# Patient Record
Sex: Female | Born: 1955 | Race: Black or African American | Hispanic: No | Marital: Married | Smoking: Never smoker
Health system: Southern US, Community
[De-identification: ages and names within clinical notes are randomized; demographics above are authoritative.]

## PROBLEM LIST (undated history)

## (undated) DIAGNOSIS — I10 Essential (primary) hypertension: Secondary | ICD-10-CM

## (undated) DIAGNOSIS — Z860100 Personal history of colon polyps, unspecified: Secondary | ICD-10-CM

## (undated) DIAGNOSIS — F32A Depression, unspecified: Secondary | ICD-10-CM

## (undated) DIAGNOSIS — G709 Myoneural disorder, unspecified: Secondary | ICD-10-CM

## (undated) DIAGNOSIS — D649 Anemia, unspecified: Secondary | ICD-10-CM

## (undated) DIAGNOSIS — F329 Major depressive disorder, single episode, unspecified: Secondary | ICD-10-CM

## (undated) DIAGNOSIS — M2170 Unequal limb length (acquired), unspecified site: Secondary | ICD-10-CM

## (undated) DIAGNOSIS — M810 Age-related osteoporosis without current pathological fracture: Secondary | ICD-10-CM

## (undated) DIAGNOSIS — E785 Hyperlipidemia, unspecified: Secondary | ICD-10-CM

## (undated) DIAGNOSIS — M545 Low back pain, unspecified: Secondary | ICD-10-CM

## (undated) DIAGNOSIS — I890 Lymphedema, not elsewhere classified: Secondary | ICD-10-CM

## (undated) DIAGNOSIS — E039 Hypothyroidism, unspecified: Secondary | ICD-10-CM

## (undated) DIAGNOSIS — I872 Venous insufficiency (chronic) (peripheral): Secondary | ICD-10-CM

## (undated) DIAGNOSIS — S52301A Unspecified fracture of shaft of right radius, initial encounter for closed fracture: Secondary | ICD-10-CM

## (undated) DIAGNOSIS — E0590 Thyrotoxicosis, unspecified without thyrotoxic crisis or storm: Secondary | ICD-10-CM

## (undated) DIAGNOSIS — E119 Type 2 diabetes mellitus without complications: Secondary | ICD-10-CM

## (undated) DIAGNOSIS — M5430 Sciatica, unspecified side: Secondary | ICD-10-CM

## (undated) DIAGNOSIS — M461 Sacroiliitis, not elsewhere classified: Secondary | ICD-10-CM

## (undated) HISTORY — DX: Anemia, unspecified: D64.9

## (undated) HISTORY — DX: Low back pain, unspecified: M54.50

## (undated) HISTORY — PX: COLONOSCOPY: SHX174

## (undated) HISTORY — DX: Major depressive disorder, single episode, unspecified: F32.9

## (undated) HISTORY — DX: Hyperlipidemia, unspecified: E78.5

## (undated) HISTORY — DX: Unequal limb length (acquired), unspecified site: M21.70

## (undated) HISTORY — DX: Sacroiliitis, not elsewhere classified: M46.1

## (undated) HISTORY — DX: Thyrotoxicosis, unspecified without thyrotoxic crisis or storm: E05.90

## (undated) HISTORY — DX: Venous insufficiency (chronic) (peripheral): I87.2

## (undated) HISTORY — PX: TUBAL LIGATION: SHX77

## (undated) HISTORY — DX: Lymphedema, not elsewhere classified: I89.0

## (undated) HISTORY — DX: Sciatica, unspecified side: M54.30

## (undated) HISTORY — DX: Hypothyroidism, unspecified: E03.9

## (undated) HISTORY — DX: Depression, unspecified: F32.A

## (undated) HISTORY — DX: Personal history of colon polyps, unspecified: Z86.0100

## (undated) HISTORY — PX: EYE SURGERY: SHX253

## (undated) HISTORY — DX: Type 2 diabetes mellitus without complications: E11.9

## (undated) HISTORY — DX: Personal history of colonic polyps: Z86.010

## (undated) HISTORY — DX: Low back pain: M54.5

## (undated) HISTORY — DX: Essential (primary) hypertension: I10

---

## 2000-04-23 DIAGNOSIS — I890 Lymphedema, not elsewhere classified: Secondary | ICD-10-CM | POA: Insufficient documentation

## 2004-10-27 ENCOUNTER — Encounter: Payer: Self-pay | Admitting: Internal Medicine

## 2005-09-21 HISTORY — PX: REPLACEMENT TOTAL KNEE: SUR1224

## 2007-02-22 HISTORY — PX: REVISION OF SCAR TISSUE RECTUS MUSCLE: SHX2351

## 2007-09-19 ENCOUNTER — Ambulatory Visit: Payer: Self-pay | Admitting: Internal Medicine

## 2007-09-19 DIAGNOSIS — D509 Iron deficiency anemia, unspecified: Secondary | ICD-10-CM | POA: Insufficient documentation

## 2007-09-19 DIAGNOSIS — I10 Essential (primary) hypertension: Secondary | ICD-10-CM | POA: Insufficient documentation

## 2007-09-19 DIAGNOSIS — Z860100 Personal history of colon polyps, unspecified: Secondary | ICD-10-CM | POA: Insufficient documentation

## 2007-09-19 DIAGNOSIS — R634 Abnormal weight loss: Secondary | ICD-10-CM

## 2007-09-19 DIAGNOSIS — M545 Low back pain: Secondary | ICD-10-CM

## 2007-09-19 DIAGNOSIS — E785 Hyperlipidemia, unspecified: Secondary | ICD-10-CM | POA: Insufficient documentation

## 2007-09-22 LAB — CONVERTED CEMR LAB
ALT: 18 units/L (ref 0–35)
AST: 22 units/L (ref 0–37)
Albumin: 3.9 g/dL (ref 3.5–5.2)
Alkaline Phosphatase: 123 units/L — ABNORMAL HIGH (ref 39–117)
BUN: 9 mg/dL (ref 6–23)
Bilirubin, Direct: 0.1 mg/dL (ref 0.0–0.3)
Chloride: 101 meq/L (ref 96–112)
Cholesterol: 172 mg/dL (ref 0–200)
Creatinine, Ser: 0.9 mg/dL (ref 0.4–1.2)
Eosinophils Relative: 2 % (ref 0.0–5.0)
GFR calc non Af Amer: 70 mL/min
LDL Cholesterol: 86 mg/dL (ref 0–99)
Lymphocytes Relative: 45 % (ref 12.0–46.0)
MCHC: 34.2 g/dL (ref 30.0–36.0)
Monocytes Absolute: 0.5 10*3/uL (ref 0.1–1.0)
Monocytes Relative: 6.8 % (ref 3.0–12.0)
Nitrite: NEGATIVE
RDW: 14.2 % (ref 11.5–14.6)
Specific Gravity, Urine: 1.02 (ref 1.000–1.03)
TSH: 0.15 microintl units/mL — ABNORMAL LOW (ref 0.35–5.50)
Total Bilirubin: 0.7 mg/dL (ref 0.3–1.2)
Total CHOL/HDL Ratio: 2.3
Urine Glucose: NEGATIVE mg/dL
Urobilinogen, UA: 1 (ref 0.0–1.0)
VLDL: 10 mg/dL (ref 0–40)
WBC: 6.8 10*3/uL (ref 4.5–10.5)

## 2007-09-23 ENCOUNTER — Ambulatory Visit: Payer: Self-pay | Admitting: Internal Medicine

## 2007-09-23 ENCOUNTER — Encounter: Payer: Self-pay | Admitting: Internal Medicine

## 2007-09-24 LAB — CONVERTED CEMR LAB: Vit D, 1,25-Dihydroxy: 25 — ABNORMAL LOW (ref 30–89)

## 2007-10-08 ENCOUNTER — Ambulatory Visit: Payer: Self-pay | Admitting: Endocrinology

## 2007-10-08 DIAGNOSIS — E042 Nontoxic multinodular goiter: Secondary | ICD-10-CM | POA: Insufficient documentation

## 2007-10-08 DIAGNOSIS — E0590 Thyrotoxicosis, unspecified without thyrotoxic crisis or storm: Secondary | ICD-10-CM | POA: Insufficient documentation

## 2007-10-08 DIAGNOSIS — N951 Menopausal and female climacteric states: Secondary | ICD-10-CM | POA: Insufficient documentation

## 2007-10-13 ENCOUNTER — Encounter: Admission: RE | Admit: 2007-10-13 | Discharge: 2007-10-13 | Payer: Self-pay | Admitting: Endocrinology

## 2007-10-21 ENCOUNTER — Encounter: Admission: RE | Admit: 2007-10-21 | Discharge: 2007-10-21 | Payer: Self-pay | Admitting: Endocrinology

## 2007-11-06 ENCOUNTER — Encounter: Admission: RE | Admit: 2007-11-06 | Discharge: 2007-11-06 | Payer: Self-pay | Admitting: Endocrinology

## 2008-01-27 ENCOUNTER — Ambulatory Visit: Payer: Self-pay | Admitting: Internal Medicine

## 2008-01-27 DIAGNOSIS — M549 Dorsalgia, unspecified: Secondary | ICD-10-CM | POA: Insufficient documentation

## 2008-01-27 DIAGNOSIS — M25569 Pain in unspecified knee: Secondary | ICD-10-CM

## 2008-01-29 ENCOUNTER — Encounter: Payer: Self-pay | Admitting: Internal Medicine

## 2008-01-29 ENCOUNTER — Encounter: Admission: RE | Admit: 2008-01-29 | Discharge: 2008-01-29 | Payer: Self-pay | Admitting: Internal Medicine

## 2008-02-03 ENCOUNTER — Encounter: Payer: Self-pay | Admitting: Internal Medicine

## 2008-02-12 ENCOUNTER — Ambulatory Visit: Payer: Self-pay | Admitting: Endocrinology

## 2008-02-12 LAB — CONVERTED CEMR LAB
Free T4: 0.8 ng/dL (ref 0.6–1.6)
TSH: 0.81 microintl units/mL (ref 0.35–5.50)

## 2008-04-12 ENCOUNTER — Telehealth (INDEPENDENT_AMBULATORY_CARE_PROVIDER_SITE_OTHER): Payer: Self-pay | Admitting: *Deleted

## 2008-04-28 ENCOUNTER — Ambulatory Visit: Payer: Self-pay | Admitting: Internal Medicine

## 2008-06-04 DIAGNOSIS — K5730 Diverticulosis of large intestine without perforation or abscess without bleeding: Secondary | ICD-10-CM | POA: Insufficient documentation

## 2008-06-09 ENCOUNTER — Ambulatory Visit: Payer: Self-pay | Admitting: Gastroenterology

## 2008-09-23 ENCOUNTER — Telehealth (INDEPENDENT_AMBULATORY_CARE_PROVIDER_SITE_OTHER): Payer: Self-pay | Admitting: *Deleted

## 2008-10-27 ENCOUNTER — Ambulatory Visit: Payer: Self-pay | Admitting: Internal Medicine

## 2008-10-27 DIAGNOSIS — G4710 Hypersomnia, unspecified: Secondary | ICD-10-CM

## 2008-10-29 LAB — CONVERTED CEMR LAB
BUN: 13 mg/dL (ref 6–23)
Basophils Absolute: 0 10*3/uL (ref 0.0–0.1)
Basophils Relative: 0.6 % (ref 0.0–3.0)
Bilirubin Urine: NEGATIVE
Creatinine, Ser: 0.8 mg/dL (ref 0.4–1.2)
Eosinophils Absolute: 0.1 10*3/uL (ref 0.0–0.7)
GFR calc non Af Amer: 95.77 mL/min (ref >60)
HCT: 39.2 % (ref 36.0–46.0)
HDL: 93.1 mg/dL (ref >39.00)
Hemoglobin, Urine: NEGATIVE
Iron: 97 ug/dL (ref 42–145)
MCHC: 34.8 g/dL (ref 30.0–36.0)
MCV: 86.1 fL (ref 78.0–100.0)
Monocytes Absolute: 0.4 10*3/uL (ref 0.1–1.0)
Neutrophils Relative %: 52 % (ref 43.0–77.0)
Nitrite: NEGATIVE
Platelets: 240 10*3/uL (ref 150.0–400.0)
Potassium: 3.5 meq/L (ref 3.5–5.1)
RBC: 4.56 M/uL (ref 3.87–5.11)
Saturation Ratios: 35.7 % (ref 20.0–50.0)
Sodium: 141 meq/L (ref 135–145)
Specific Gravity, Urine: 1.025 (ref 1.000–1.030)
Transferrin: 194.1 mg/dL — ABNORMAL LOW (ref 212.0–360.0)
Urine Glucose: NEGATIVE mg/dL
Vit D, 25-Hydroxy: 29 ng/mL — ABNORMAL LOW (ref 30–89)
WBC: 7 10*3/uL (ref 4.5–10.5)

## 2008-11-17 ENCOUNTER — Ambulatory Visit: Payer: Self-pay | Admitting: Pulmonary Disease

## 2008-11-17 DIAGNOSIS — G4733 Obstructive sleep apnea (adult) (pediatric): Secondary | ICD-10-CM

## 2008-12-15 ENCOUNTER — Encounter: Payer: Self-pay | Admitting: Pulmonary Disease

## 2008-12-15 ENCOUNTER — Ambulatory Visit (HOSPITAL_BASED_OUTPATIENT_CLINIC_OR_DEPARTMENT_OTHER): Admission: RE | Admit: 2008-12-15 | Discharge: 2008-12-15 | Payer: Self-pay | Admitting: Pulmonary Disease

## 2008-12-21 ENCOUNTER — Ambulatory Visit: Payer: Self-pay | Admitting: Pulmonary Disease

## 2008-12-22 ENCOUNTER — Telehealth (INDEPENDENT_AMBULATORY_CARE_PROVIDER_SITE_OTHER): Payer: Self-pay | Admitting: *Deleted

## 2008-12-30 ENCOUNTER — Ambulatory Visit: Payer: Self-pay | Admitting: Pulmonary Disease

## 2009-01-07 ENCOUNTER — Ambulatory Visit: Payer: Self-pay | Admitting: Internal Medicine

## 2009-01-07 DIAGNOSIS — F329 Major depressive disorder, single episode, unspecified: Secondary | ICD-10-CM | POA: Insufficient documentation

## 2009-01-07 DIAGNOSIS — R5381 Other malaise: Secondary | ICD-10-CM | POA: Insufficient documentation

## 2009-01-07 DIAGNOSIS — R5383 Other fatigue: Secondary | ICD-10-CM

## 2009-01-07 DIAGNOSIS — F32A Depression, unspecified: Secondary | ICD-10-CM | POA: Insufficient documentation

## 2009-01-07 LAB — CONVERTED CEMR LAB: TSH: 1.8 microintl units/mL (ref 0.35–5.50)

## 2009-01-31 ENCOUNTER — Encounter: Payer: Self-pay | Admitting: Internal Medicine

## 2009-02-18 ENCOUNTER — Ambulatory Visit: Payer: Self-pay | Admitting: Internal Medicine

## 2009-03-18 ENCOUNTER — Ambulatory Visit: Payer: Self-pay | Admitting: Family Medicine

## 2009-03-18 DIAGNOSIS — J019 Acute sinusitis, unspecified: Secondary | ICD-10-CM

## 2009-03-25 ENCOUNTER — Telehealth: Payer: Self-pay | Admitting: Internal Medicine

## 2009-05-30 ENCOUNTER — Telehealth: Payer: Self-pay | Admitting: Internal Medicine

## 2009-06-17 ENCOUNTER — Ambulatory Visit: Payer: Self-pay | Admitting: Internal Medicine

## 2009-06-17 DIAGNOSIS — H9193 Unspecified hearing loss, bilateral: Secondary | ICD-10-CM

## 2009-06-17 LAB — CONVERTED CEMR LAB: TSH: 1.7 microintl units/mL (ref 0.35–5.50)

## 2009-06-22 ENCOUNTER — Encounter: Payer: Self-pay | Admitting: Internal Medicine

## 2009-06-29 ENCOUNTER — Telehealth: Payer: Self-pay | Admitting: Internal Medicine

## 2009-09-28 ENCOUNTER — Ambulatory Visit: Payer: Self-pay | Admitting: Endocrinology

## 2009-09-28 DIAGNOSIS — R7989 Other specified abnormal findings of blood chemistry: Secondary | ICD-10-CM | POA: Insufficient documentation

## 2009-09-29 LAB — CONVERTED CEMR LAB: TSH: 3.89 microintl units/mL (ref 0.35–5.50)

## 2009-09-30 ENCOUNTER — Encounter: Admission: RE | Admit: 2009-09-30 | Discharge: 2009-09-30 | Payer: Self-pay | Admitting: Endocrinology

## 2009-11-01 ENCOUNTER — Encounter: Payer: Self-pay | Admitting: Internal Medicine

## 2009-11-01 DIAGNOSIS — M899 Disorder of bone, unspecified: Secondary | ICD-10-CM

## 2009-11-01 DIAGNOSIS — M949 Disorder of cartilage, unspecified: Secondary | ICD-10-CM

## 2009-11-09 ENCOUNTER — Ambulatory Visit: Payer: Self-pay | Admitting: Internal Medicine

## 2009-11-09 ENCOUNTER — Encounter: Payer: Self-pay | Admitting: Internal Medicine

## 2010-01-02 ENCOUNTER — Ambulatory Visit: Payer: Self-pay | Admitting: Internal Medicine

## 2010-01-02 LAB — CONVERTED CEMR LAB
AST: 18 units/L (ref 0–37)
Basophils Absolute: 0 10*3/uL (ref 0.0–0.1)
Basophils Relative: 0.2 % (ref 0.0–3.0)
Bilirubin Urine: NEGATIVE
CO2: 34 meq/L — ABNORMAL HIGH (ref 19–32)
Calcium: 9.8 mg/dL (ref 8.4–10.5)
Eosinophils Absolute: 0.2 10*3/uL (ref 0.0–0.7)
Eosinophils Relative: 3.6 % (ref 0.0–5.0)
Hemoglobin, Urine: NEGATIVE
Hemoglobin: 13.1 g/dL (ref 12.0–15.0)
LDL Cholesterol: 86 mg/dL (ref 0–99)
Lymphocytes Relative: 47.1 % — ABNORMAL HIGH (ref 12.0–46.0)
Lymphs Abs: 2.7 10*3/uL (ref 0.7–4.0)
MCHC: 34.4 g/dL (ref 30.0–36.0)
MCV: 85.8 fL (ref 78.0–100.0)
Monocytes Relative: 9.4 % (ref 3.0–12.0)
Neutrophils Relative %: 39.7 % — ABNORMAL LOW (ref 43.0–77.0)
Platelets: 229 10*3/uL (ref 150.0–400.0)
RBC: 4.43 M/uL (ref 3.87–5.11)
RDW: 15.6 % — ABNORMAL HIGH (ref 11.5–14.6)
Total CHOL/HDL Ratio: 2
Total Protein, Urine: NEGATIVE mg/dL
Urine Glucose: NEGATIVE mg/dL
VLDL: 4.4 mg/dL (ref 0.0–40.0)
WBC: 5.7 10*3/uL (ref 4.5–10.5)
pH: 6 (ref 5.0–8.0)

## 2010-01-04 ENCOUNTER — Ambulatory Visit: Payer: Self-pay | Admitting: Internal Medicine

## 2010-01-04 ENCOUNTER — Encounter: Payer: Self-pay | Admitting: Internal Medicine

## 2010-01-05 ENCOUNTER — Encounter: Payer: Self-pay | Admitting: Internal Medicine

## 2010-01-17 ENCOUNTER — Ambulatory Visit (HOSPITAL_COMMUNITY)
Admission: RE | Admit: 2010-01-17 | Discharge: 2010-01-17 | Payer: Self-pay | Source: Home / Self Care | Admitting: Sports Medicine

## 2010-01-31 ENCOUNTER — Encounter: Payer: Self-pay | Admitting: Internal Medicine

## 2010-02-08 ENCOUNTER — Encounter: Payer: Self-pay | Admitting: Internal Medicine

## 2010-03-13 ENCOUNTER — Encounter: Payer: Self-pay | Admitting: Internal Medicine

## 2010-05-23 NOTE — Assessment & Plan Note (Signed)
Summary: FU ON THYROID/NWS   Vital Signs:  Patient profile:   55 year old female Height:      68 inches Weight:      160 pounds BMI:     24.42 O2 Sat:      96 % on Room air Temp:     97.5 degrees F oral Pulse rate:   82 / minute BP sitting:   110 / 72  (left arm) Cuff size:   regular  Vitals Entered ByZella Ball Ewing (June 17, 2009 3:38 PM)  O2 Flow:  Room air CC: Thyroid followup/RE   Primary Care Provider:  Oliver Barre, MD  CC:  Thyroid followup/RE.  History of Present Illness: here after hx of I 131 tx wondering if she needs synthroid tx;  denies s/s low thyroid at this time, including fatigue, wt gain, low HR, skin change, depression, sleeping more.  Needs all meds sent to [facility] today, wondering who to see for optho exam that she is due for but denies eye symptoms such as pain, blurred vision, headache.  Pt denies CP, sob, doe, wheezing, orthopnea, pnd, worsening LE edema, palps, dizziness or syncope   Pt denies new neuro symptoms such as headache, facial or extremity weakness  Also c/o bialt hearing loss bilat, acute on chronic assoc with popping in the ears and fullness and sinus congestion.    Problems Prior to Update: 1)  Sinusitis, Acute  (ICD-461.9) 2)  Need Prophylactic Vaccination&inoculation Flu  (ICD-V04.81) 3)  Depression  (ICD-311) 4)  Fatigue  (ICD-780.79) 5)  Obstructive Sleep Apnea  (ICD-327.23) 6)  Hypersomnia  (ICD-780.54) 7)  Fm Hx Malignant Neoplasm Gastrointestinal Tract  (ICD-V16.0) 8)  Diverticulosis, Colon  (ICD-562.10) 9)  Knee Pain, Right  (ICD-719.46) 10)  Back Pain  (ICD-724.5) 11)  Menopausal Syndrome  (ICD-627.2) 12)  Hyperthyroidism  (ICD-242.90) 13)  Goiter, Multinodular  (ICD-241.1) 14)  Weight Loss  (ICD-783.21) 15)  Low Back Pain  (ICD-724.2) 16)  Preventive Health Care  (ICD-V70.0) 17)  Hypertension  (ICD-401.9) 18)  Hyperlipidemia  (ICD-272.4) 19)  Colonic Polyps, Hx of  (ICD-V12.72) 20)  Anemia-iron Deficiency   (ICD-280.9)  Medications Prior to Update: 1)  Cartia Xt 240 Mg  Cp24 (Diltiazem Hcl Coated Beads) .Marland Kitchen.. 1po Once Daily 2)  Guanfacine Hcl 1 Mg  Tabs (Guanfacine Hcl) .Marland Kitchen.. 1 By Mouth Once Daily 3)  Furosemide 80 Mg  Tabs (Furosemide) .Marland Kitchen.. 1 By Mouth Once Daily 4)  Klor-Con 20 Meq  Pack (Potassium Chloride) .Marland Kitchen.. 1 By Mouth Qd 5)  Lipitor 10 Mg Tabs (Atorvastatin Calcium) .Marland Kitchen.. 1po Once Daily 6)  Ultram Er 300 Mg Xr24h-Tab (Tramadol Hcl) .Marland Kitchen.. 1 By Mouth Once Daily - To Fill May 30, 2009 7)  Adult Aspirin Ec Low Strength 81 Mg  Tbec (Aspirin) .Marland Kitchen.. 1po Qd 8)  Vitamin D3 1000 Unit  Tabs (Cholecalciferol) .... 2 Po Once Daily 9)  Calcium Citrate + D 300-100 Mg-Unit Tabs (Calcium Citrate-Vitamin D) .Marland Kitchen.. 1 Tablet By Mouth Once Daily 10)  Omega-3 350 Mg Caps (Omega-3 Fatty Acids) .Marland Kitchen.. 1 Tablet By Mouth Once Daily 11)  Effexor Xr 75 Mg Xr24h-Cap (Venlafaxine Hcl) .Marland Kitchen.. 1 Daily 12)  Vagifem 25 Mcg Tabs (Estradiol) .... 2 To 3 Times Per Week  Current Medications (verified): 1)  Cartia Xt 240 Mg  Cp24 (Diltiazem Hcl Coated Beads) .Marland Kitchen.. 1po Once Daily 2)  Guanfacine Hcl 1 Mg  Tabs (Guanfacine Hcl) .Marland Kitchen.. 1 By Mouth Once Daily 3)  Furosemide 80 Mg  Tabs (Furosemide) .Marland KitchenMarland KitchenMarland Kitchen  1 By Mouth Once Daily 4)  Klor-Con 20 Meq  Pack (Potassium Chloride) .Marland Kitchen.. 1 By Mouth Once Daily 5)  Lipitor 10 Mg Tabs (Atorvastatin Calcium) .Marland Kitchen.. 1po Once Daily 6)  Ultram Er 300 Mg Xr24h-Tab (Tramadol Hcl) .Marland Kitchen.. 1 By Mouth Once Daily - To Fill Jun 16, 2009 7)  Adult Aspirin Ec Low Strength 81 Mg  Tbec (Aspirin) .Marland Kitchen.. 1po Qd 8)  Vitamin D3 1000 Unit  Tabs (Cholecalciferol) .... 2 Po Once Daily 9)  Calcium Citrate + D 300-100 Mg-Unit Tabs (Calcium Citrate-Vitamin D) .Marland Kitchen.. 1 Tablet By Mouth Once Daily 10)  Omega-3 350 Mg Caps (Omega-3 Fatty Acids) .Marland Kitchen.. 1 Tablet By Mouth Once Daily 11)  Effexor Xr 75 Mg Xr24h-Cap (Venlafaxine Hcl) .Marland Kitchen.. 1 Daily 12)  Vagifem 25 Mcg Tabs (Estradiol) .... 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   Head:  normocephalic and atraumatic.   Eyes:  vision grossly intact, pupils equal, and pupils round.   Ears:  R ear normal and L ear normal.   Nose:  no external deformity and no nasal discharge.   Mouth:  no gingival abnormalities and pharynx pink and moist.   Neck:  supple and no masses.   Lungs:  normal respiratory effort and normal breath sounds.   Heart:  normal rate and regular rhythm.   Msk:  no joint tenderness and no joint swelling.   Extremities:  no edema, no erythema  Psych:  not anxious appearing and not depressed appearing.     Impression & Recommendations:  Problem # 1:  HYPERTHYROIDISM (ICD-242.90) s/p I 131 without significant s/s hypothyroidism; to check TSH   Orders: TLB-TSH (Thyroid Stimulating Hormone) (84443-TSH)  Problem # 2:  HYPERTENSION (ICD-401.9)  Her updated medication list for this problem includes:    Cartia Xt 240 Mg Cp24 (Diltiazem hcl coated beads) .Marland Kitchen... 1po once daily    Guanfacine Hcl 1 Mg Tabs  (Guanfacine hcl) .Marland Kitchen... 1 by mouth once daily    Furosemide 80 Mg Tabs (Furosemide) .Marland Kitchen... 1 by mouth once daily  BP today: 110/72 Prior BP: 164/112 (03/18/2009)  Labs Reviewed: K+: 3.5 (10/27/2008) Creat: : 0.8 (10/27/2008)   Chol: 239 (10/27/2008)   HDL: 93.10 (10/27/2008)   LDL: 86 (09/19/2007)   TG: 44.0 (10/27/2008) stable overall by hx and exam, ok to continue meds/tx as is   Problem # 3:  UNSPECIFIED HEARING LOSS (ICD-389.9) c/w eustachian valve dysfxn  with acute on chronic hearing loss;  try claritin and mucinex OTC for one wk;  consider ENT if not improved  Complete Medication List: 1)  Cartia Xt 240 Mg Cp24 (Diltiazem hcl coated beads) .Marland Kitchen.. 1po once daily 2)  Guanfacine Hcl 1 Mg Tabs (Guanfacine hcl) .Marland Kitchen.. 1 by mouth once daily 3)  Furosemide 80 Mg Tabs (Furosemide) .Marland Kitchen.. 1 by mouth once daily 4)  Klor-con 20 Meq Pack (Potassium chloride) .Marland Kitchen.. 1 by mouth once daily 5)  Lipitor 10 Mg Tabs (Atorvastatin calcium) .Marland Kitchen.. 1po once daily 6)  Ultram Er 300 Mg Xr24h-tab (Tramadol hcl) .Marland Kitchen.. 1 by mouth once daily - to fill Jun 16, 2009 7)  Adult Aspirin Ec Low Strength 81 Mg Tbec (Aspirin) .Marland Kitchen.. 1po qd 8)  Vitamin D3 1000 Unit Tabs (Cholecalciferol) .... 2 po once daily 9)  Calcium Citrate + D 300-100 Mg-unit Tabs (Calcium citrate-vitamin d) .Marland Kitchen.. 1 tablet by mouth once daily 10)  Omega-3 350 Mg Caps (Omega-3 fatty acids) .Marland Kitchen.. 1 tablet by mouth once daily 11)  Effexor Xr 75 Mg Xr24h-cap (Venlafaxine hcl) .Marland Kitchen.. 1 daily 12)  Vagifem 25 Mcg Tabs (Estradiol) .... 2 to 3 times per week  Patient Instructions: 1)  Please call [facility], or [facility] for eye care 2)  Please go to the Lab in the basement for your blood and/or urine tests today 3)  all of your medications were sent to [facility] in [place] except for the Ultram 4)  Please try OTC claritin and Mucinex for one wk; call if no better for ENT referral for the hearing loss 5)  Please schedule a follow-up appointment in 6  months with CPX labs Prescriptions: ULTRAM ER 300 MG XR24H-TAB (TRAMADOL HCL) 1 by mouth once daily - to fill Jun 16, 2009  #90 x 1   Entered and Authorized by:   Corwin Levins MD   Signed by:   Corwin Levins MD on 06/17/2009   Method used:   Print then Give to Patient   RxID:   [id number] LIPITOR 10 MG TABS (ATORVASTATIN CALCIUM) 1po once daily  #90 x 3   Entered and Authorized by:   Corwin Levins MD   Signed by:   Corwin Levins MD on 06/17/2009   Method used:   Electronically to        [facility] ([facility]       [address]       [place], [place]  [address]       Ph: [phone]       Fax: [phone]   RxID:   [id number] KLOR-CON 20 MEQ  PACK (POTASSIUM CHLORIDE) 1 by mouth once daily  #90 x 3   Entered and Authorized by:   Corwin Levins MD   Signed by:   Corwin Levins MD on 06/17/2009   Method used:   Electronically to        [facility] ([facility])       [address]       [place], [place]  [address]       Ph: [phone]       Fax: [phone]   RxID:   [id number] FUROSEMIDE 80 MG  TABS (FUROSEMIDE) 1 by mouth once daily  #90 x 3   Entered and Authorized by:   Corwin Levins MD   Signed by:   Corwin Levins MD on 06/17/2009   Method used:   Electronically to        [facility] ([facility])       [address]  [address]       Ph: [phone]       Fax: [phone]   RxID:   [id number] CARTIA XT 240 MG  CP24 (DILTIAZEM HCL COATED BEADS) 1po once daily  #90 x 3   Entered and Authorized by:   Corwin Levins MD   Signed by:   Corwin Levins MD on 06/17/2009   Method used:   Electronically to        [facility] ([facility])       [address]       [place], [place]  [address]       Ph: [phone]       Fax: [phone]   RxID:   [id number] GUANFACINE HCL 1 MG  TABS (GUANFACINE HCL) 1 by mouth once daily  #90 x 3   Entered and Authorized by:   Corwin Levins MD    Signed by:   Corwin Levins MD on 06/17/2009   Method used:   Electronically to        [facility] ([facility]       [address]       [place], [place]  [address]       Ph: [phone]       Fax: [phone]   RxID:   [id number]

## 2010-05-23 NOTE — Letter (Signed)
Summary: [facility]  [facility]   Imported By: Lennie Odor 02/14/2010 14:31:35  _____________________________________________________________________  External Attachment:    Type:   Image     Comment:   External Document

## 2010-05-23 NOTE — Miscellaneous (Signed)
Summary: Orders Update  Clinical Lists Changes  Problems: Added new problem of OSTEOPENIA (ICD-733.90) Orders: Added new Test order of T-Bone Densitometry [phone]) - Signed

## 2010-05-23 NOTE — Letter (Signed)
Summary: [facility]   Imported By: Lester [place] 03/20/2010 07:03:17  _____________________________________________________________________  External Attachment:    Type:   Image     Comment:   External Document

## 2010-05-23 NOTE — Miscellaneous (Signed)
Summary: BONE DENSITY  Clinical Lists Changes  Orders: Added new Test order of T-Lumbar Vertebral Assessment (770[address]) - Signed 

## 2010-05-23 NOTE — Medication Information (Signed)
Summary: [facility]  [facility]   Imported By: Lester Early 06/24/2009 07:53:34  _____________________________________________________________________  External Attachment:    Type:   Image     Comment:   External Document

## 2010-05-23 NOTE — Letter (Signed)
Summary: [facility]  [facility]   Imported By: Sherian Rein 01/11/2010 07:53:01  _____________________________________________________________________  External Attachment:    Type:   Image     Comment:   External Document

## 2010-05-23 NOTE — Progress Notes (Signed)
Summary: pharmacy?  Phone Note From Pharmacy   [facility] [phone] ref # [id number] Summary of Call: pharmacy called requesting clarification on Rx for Klor-Con pack 1tab by mouth once daily. Should powder or tab be dispensed? Initial call taken by: Margaret Pyle, CMA,  June 29, 2009 4:29 PM  Follow-up for Phone Call        tab is good Follow-up by: Corwin Levins MD,  June 29, 2009 4:39 PM  Additional Follow-up for Phone Call Additional follow up Details #1::        Reuel Boom at [facility] was informed Additional Follow-up by: Sherese Christopher June 30, 2009 8:35 AM

## 2010-05-23 NOTE — Progress Notes (Signed)
Summary: med refill  Phone Note Refill Request  on May 30, 2009 12:01 PM  Refills Requested: Medication #1:  ULTRAM ER 300 MG XR24H-TAB 1 by mouth once daily   Dosage confirmed as above?Dosage Confirmed   Last Refilled: 12/2008 Initial call taken by: Scharlene Gloss,  May 30, 2009 12:01 PM    New/Updated Medications: ULTRAM ER 300 MG XR24H-TAB (TRAMADOL HCL) 1 by mouth once daily - to fill May 30, 2009 Prescriptions: ULTRAM ER 300 MG XR24H-TAB (TRAMADOL HCL) 1 by mouth once daily - to fill May 30, 2009  #30 x 5   Entered and Authorized by:   Corwin Levins MD   Signed by:   Corwin Levins MD on 05/30/2009   Method used:   Print then Give to Patient   RxID:   [id number]  done hardcopy to LIM side B - dahlia  Corwin Levins MD  May 30, 2009 1:01 PM   faxed. Lucious Groves  May 30, 2009 1:32 PM

## 2010-05-23 NOTE — Assessment & Plan Note (Signed)
Summary: CPX /NWS  #   Vital Signs:  Patient profile:   55 year old female Height:      68.5 inches Weight:      161.38 pounds BMI:     24.27 O2 Sat:      95 % on Room air Temp:     98.2 degrees F oral Pulse rate:   89 / minute BP sitting:   110 / 78  (left arm) Cuff size:   regular  Vitals Entered By: Zella Ball Ewing CMA Duncan Dull) (January 04, 2010 11:10 AM)  O2 Flow:  Room air  Preventive Care Screening  Mammogram:    Date:  01/21/2009    Results:  normal   CC: Adult Physical/RE   Primary Care Kolina Kube:  Oliver Barre, MD  CC:  Adult Physical/RE.  History of Present Illness: overall doing well, no specific complaint's;  Pt denies CP, worsening sob, doe, wheezing, orthopnea, pnd, worsening LE edema, palps, dizziness or syncope  Pt denies new neuro symptoms such as headache, facial or extremity weakness  No fever, wt loss, night sweats, loss of appetite or other constitutional symptoms  Denies polydipsia or polyuria.  Problems Prior to Update: 1)  Osteopenia  (ICD-733.90) 2)  Other Abnormal Blood Chemistry  (ICD-790.6) 3)  Unspecified Hearing Loss  (ICD-389.9) 4)  Sinusitis, Acute  (ICD-461.9) 5)  Need Prophylactic Vaccination&inoculation Flu  (ICD-V04.81) 6)  Depression  (ICD-311) 7)  Fatigue  (ICD-780.79) 8)  Obstructive Sleep Apnea  (ICD-327.23) 9)  Hypersomnia  (ICD-780.54) 10)  Fm Hx Malignant Neoplasm Gastrointestinal Tract  (ICD-V16.0) 11)  Diverticulosis, Colon  (ICD-562.10) 12)  Knee Pain, Right  (ICD-719.46) 13)  Back Pain  (ICD-724.5) 14)  Menopausal Syndrome  (ICD-627.2) 15)  Hyperthyroidism  (ICD-242.90) 16)  Goiter, Multinodular  (ICD-241.1) 17)  Weight Loss  (ICD-783.21) 18)  Low Back Pain  (ICD-724.2) 19)  Preventive Health Care  (ICD-V70.0) 20)  Hypertension  (ICD-401.9) 21)  Hyperlipidemia  (ICD-272.4) 22)  Colonic Polyps, Hx of  (ICD-V12.72) 23)  Anemia-iron Deficiency  (ICD-280.9)  Medications Prior to Update: 1)  Cartia Xt 240 Mg  Cp24  (Diltiazem Hcl Coated Beads) .Marland Kitchen.. 1po Once Daily 2)  Guanfacine Hcl 1 Mg  Tabs (Guanfacine Hcl) .Marland Kitchen.. 1 By Mouth Once Daily 3)  Furosemide 80 Mg  Tabs (Furosemide) .Marland Kitchen.. 1 By Mouth Once Daily 4)  Klor-Con 20 Meq  Pack (Potassium Chloride) .Marland Kitchen.. 1 By Mouth Once Daily 5)  Lipitor 10 Mg Tabs (Atorvastatin Calcium) .Marland Kitchen.. 1po Once Daily 6)  Ultram Er 300 Mg Xr24h-Tab (Tramadol Hcl) .Marland Kitchen.. 1 By Mouth Once Daily - To Fill Jun 16, 2009 7)  Adult Aspirin Ec Low Strength 81 Mg  Tbec (Aspirin) .Marland Kitchen.. 1po Qd 8)  Vitamin D3 1000 Unit  Tabs (Cholecalciferol) .... 2 Po Once Daily 9)  Calcium Citrate + D 300-100 Mg-Unit Tabs (Calcium Citrate-Vitamin D) .Marland Kitchen.. 1 Tablet By Mouth Once Daily 10)  Omega-3 350 Mg Caps (Omega-3 Fatty Acids) .Marland Kitchen.. 1 Tablet By Mouth Once Daily 11)  Effexor Xr 75 Mg Xr24h-Cap (Venlafaxine Hcl) .Marland Kitchen.. 1 Daily 12)  Menaquil (Herbal) .... Take 1 By Mouth Once Daily For Menopause  Current Medications (verified): 1)  Cartia Xt 240 Mg  Cp24 (Diltiazem Hcl Coated Beads) .Marland Kitchen.. 1po Once Daily 2)  Guanfacine Hcl 1 Mg  Tabs (Guanfacine Hcl) .Marland Kitchen.. 1 By Mouth Once Daily 3)  Furosemide 80 Mg  Tabs (Furosemide) .Marland Kitchen.. 1 By Mouth Once Daily 4)  Klor-Con 20 Meq  Pack (Potassium Chloride) .Marland KitchenMarland KitchenMarland Kitchen 1  By Mouth Once Daily 5)  Lipitor 10 Mg Tabs (Atorvastatin Calcium) .Marland Kitchen.. 1po Once Daily 6)  Ultram Er 300 Mg Xr24h-Tab (Tramadol Hcl) .Marland Kitchen.. 1 By Mouth Once Daily - To Fill Sept 15, 2011 7)  Adult Aspirin Ec Low Strength 81 Mg  Tbec (Aspirin) .Marland Kitchen.. 1po Qd 8)  Vitamin D3 1000 Unit  Tabs (Cholecalciferol) .... 2 Po Once Daily 9)  Calcium Citrate + D 300-100 Mg-Unit Tabs (Calcium Citrate-Vitamin D) .Marland Kitchen.. 1 Tablet By Mouth Once Daily 10)  Omega-3 350 Mg Caps (Omega-3 Fatty Acids) .Marland Kitchen.. 1 Tablet By Mouth Once Daily 11)  Effexor Xr 75 Mg Xr24h-Cap (Venlafaxine Hcl) .Marland Kitchen.. 1 Daily 12)  Menaquil (Herbal) .... 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         all otherwise negative per pt -  except for pain to right knee with lateral sweling adn grinding for several months, no falls  Physical Exam  General:  alert and well-developed.   Head:  normocephalic and atraumatic.   Eyes:  vision grossly intact, pupils equal, and pupils round.   Ears:  R ear normal and L ear normal.   Nose:  no external deformity and no nasal discharge.   Mouth:  no gingival abnormalities and pharynx pink and moist.   Neck:  supple and no masses.   Lungs:  normal respiratory effort and normal breath sounds.   Heart:  normal rate and regular rhythm.   Abdomen:  soft, non-tender, and normal bowel sounds.   Msk:  no joint tenderness and  no joint swelling.  except for right knee lateral sweling s/p TKR iwth coms crepitus adn mild decreased ROM Extremities:  no edema, no erythema  Neurologic:  cranial nerves II-XII intact and strength normal in all extremities.   Skin:  color normal and no rashes.   Psych:  not anxious appearing and not depressed appearing.     Impression & Recommendations:  Problem # 1:  Preventive Health Care (ICD-V70.0) Overall doing well, age appropriate education and counseling updated and referral for appropriate preventive services done unless declined, immunizations up to date or declined, diet counseling done if overweight, urged to quit smoking if smokes , most recent labs reviewed and current ordered if appropriate, ecg reviewed or declined (interpretation per ECG scanned in the EMR if done); information regarding Medicare Prevention requirements given if appropriate; speciality referrals updated as appropriate  Orders: EKG w/ Interpretation (93000) .  Problem # 2:  KNEE PAIN, RIGHT (ICD-719.46)  Her updated medication list for this problem includes:    Ultram Er 300 Mg Xr24h-tab (Tramadol hcl) .Marland Kitchen... 1 by mouth once daily - to fill sept 15, 2011    Adult Aspirin Ec Low Strength 81 Mg Tbec (Aspirin) .Marland Kitchen... 1po qd treat as above, f/u any worsening signs or symptoms ,  ok to refer to ortho for f/.u  Orders: Orthopedic Surgeon Referral (Ortho Surgeon)  Complete Medication List: 1)  Cartia Xt 240 Mg Cp24 (Diltiazem hcl coated beads) .Marland Kitchen.. 1po once daily 2)  Guanfacine Hcl 1 Mg Tabs (Guanfacine hcl) .Marland Kitchen.. 1 by mouth once daily 3)  Furosemide 80 Mg Tabs (Furosemide) .Marland Kitchen.. 1 by mouth once daily 4)  Klor-con 20 Meq Pack (Potassium chloride) .Marland Kitchen.. 1 by mouth once daily 5)  Lipitor 10 Mg Tabs (Atorvastatin calcium) .Marland Kitchen.. 1po once daily 6)  Ultram Er 300 Mg Xr24h-tab (Tramadol hcl) .Marland Kitchen.. 1 by mouth once daily - to fill sept 15, 2011 7)  Adult Aspirin Ec Low Strength 81 Mg Tbec (Aspirin) .Marland Kitchen.. 1po qd 8)   Vitamin D3 1000 Unit Tabs (Cholecalciferol) .... 2 po once daily 9)  Calcium Citrate + D 300-100 Mg-unit Tabs (Calcium citrate-vitamin d) .Marland Kitchen.. 1 tablet by mouth once daily 10)  Omega-3 350 Mg Caps (Omega-3 fatty acids) .Marland Kitchen.. 1 tablet by mouth once daily 11)  Effexor Xr 75 Mg Xr24h-cap (Venlafaxine hcl) .Marland Kitchen.. 1 daily 12)  Menaquil (herbal)  .... Take 1 by mouth once daily for menopause  Patient Instructions: 1)  You will be contacted about the referral(s) to: orthopedic 2)  Continue all previous medications as before this visit 3)  Please schedule a follow-up appointment in 1 year or sooner if needed Prescriptions: ULTRAM ER 300 MG XR24H-TAB (TRAMADOL HCL) 1 by mouth once daily - to fill sept 15, 2011  #30 x 5   Entered and Authorized by:   Corwin Levins MD   Signed by:   Corwin Levins MD on 01/04/2010   Method used:   Print then Give to Patient   RxID:   [id number]

## 2010-05-23 NOTE — Assessment & Plan Note (Signed)
Summary: f/u up thyroid/#/cd   Vital Signs:  Patient profile:   55 year old female Height:      68 inches (172.72 cm) Weight:      160.0 pounds (72.73 kg) O2 Sat:      98 % on Room air Temp:     98.9 degrees F (37.17 degrees C) oral Pulse rate:   101 / minute BP sitting:   122 / 90  (left arm) Cuff size:   regular  Vitals Entered By: Orlan Leavens (September 28, 2009 9:56 AM)  O2 Flow:  Room air CC: F/U on thyroid Is Patient Diabetic? No   Referring Provider:  Oliver Barre Primary Provider:  Oliver Barre, MD  CC:  F/U on thyroid.  History of Present Illness: the status of at least 3 ongoing medical problems is addressed today: pt is now 2 years s/p i-131 rx for hyperthyroidism due to a multinodular goiter.  she has not required any thyroid medication.  she feels tired.  she also has night sweats and "hot flashes." weight gain:  she has few lbs since last ov with me.   multinodular goiter:  she does not notice the goiter.    Current Medications (verified): 1)  Cartia Xt 240 Mg  Cp24 (Diltiazem Hcl Coated Beads) .Marland Kitchen.. 1po Once Daily 2)  Guanfacine Hcl 1 Mg  Tabs (Guanfacine Hcl) .Marland Kitchen.. 1 By Mouth Once Daily 3)  Furosemide 80 Mg  Tabs (Furosemide) .Marland Kitchen.. 1 By Mouth Once Daily 4)  Klor-Con 20 Meq  Pack (Potassium Chloride) .Marland Kitchen.. 1 By Mouth Once Daily 5)  Lipitor 10 Mg Tabs (Atorvastatin Calcium) .Marland Kitchen.. 1po Once Daily 6)  Ultram Er 300 Mg Xr24h-Tab (Tramadol Hcl) .Marland Kitchen.. 1 By Mouth Once Daily - To Fill Jun 16, 2009 7)  Adult Aspirin Ec Low Strength 81 Mg  Tbec (Aspirin) .Marland Kitchen.. 1po Qd 8)  Vitamin D3 1000 Unit  Tabs (Cholecalciferol) .... 2 Po Once Daily 9)  Calcium Citrate + D 300-100 Mg-Unit Tabs (Calcium Citrate-Vitamin D) .Marland Kitchen.. 1 Tablet By Mouth Once Daily 10)  Omega-3 350 Mg Caps (Omega-3 Fatty Acids) .Marland Kitchen.. 1 Tablet By Mouth Once Daily 11)  Effexor Xr 75 Mg Xr24h-Cap (Venlafaxine Hcl) .Marland Kitchen.. 1 Daily 12)  Menaquil (Herbal) .... 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         denies dysphagia  Physical Exam  General:  normal appearance.   Neck:  there are 2 easily palpable left lobe thyroid nodules.  the most superficial is approx 1-1.5 cm.  i can't tell the dimensions of the other.   Additional Exam:  FastTSH                   3.89 uIU/mL                 0.35-5.50 Hemoglobin A1C            5.6 %       Impression & Recommendations:  Problem # 1:  HYPERTHYROIDISM (ICD-242.90) Assessment Improved now euthyroid  Problem # 2:  GOITER, MULTINODULAR (ICD-241.1)  Problem # 3:  weight gain dm is not present now  Medications Added to Medication List This Visit: 1)  Menaquil (herbal)  .... Take 1 by mouth once daily for menopause  Other Orders: TLB-TSH (Thyroid Stimulating Hormone) (84443-TSH) TLB-A1C / Hgb A1C (Glycohemoglobin) (83036-A1C) Radiology Referral (Radiology) Est. Patient Level IV ([address])  Patient Instructions: 1)  recheck thyroid ultrasound.  you will be called with a day and time for an appointment. 2)  blood tests are being ordered for you today.  please call [phone] to hear your test results.  another message will be left for you after the ultrasound. 3)  we'll also check the blood sugar. 4)  Please schedule a follow-up appointment in 1 year. 5)  (update: i left message on phone-tree:  rx as we discussed)

## 2010-06-22 ENCOUNTER — Telehealth: Payer: Self-pay | Admitting: Internal Medicine

## 2010-06-29 NOTE — Progress Notes (Signed)
Summary: med refill  Phone Note Refill Request Message from:  Fax from Pharmacy on June 22, 2010 9:42 AM  Refills Requested: Medication #1:  ULTRAM ER 300 MG XR24H-TAB 1 by mouth once daily - to fill sept 15   Dosage confirmed as above?Dosage Confirmed   Last Refilled: 01/04/2010   Notes: [facility] Fax#[phone] Initial call taken by: [name] Ewing CMA (AAMA),  June 22, 2010 9:42 AM    New/Updated Medications: ULTRAM ER 300 MG XR24H-TAB (TRAMADOL HCL) 1 by mouth once daily - to fill Jun 22, 2010 Prescriptions: ULTRAM ER 300 MG XR24H-TAB (TRAMADOL HCL) 1 by mouth once daily - to fill Jun 22, 2010  #30 x 5   Entered and Authorized by:   Corwin Levins MD   Signed by:   Corwin Levins MD on 06/22/2010   Method used:   Faxed to ...       [facility] (mail-order)       [address] [place], [place]  [address]       Ph: [phone]       Fax: [phone]   RxID:   [id number]   done  - faxed to pharmacy Corwin Levins MD  June 22, 2010 1:09 PM

## 2010-07-03 ENCOUNTER — Encounter: Payer: Self-pay | Admitting: Internal Medicine

## 2010-07-03 ENCOUNTER — Ambulatory Visit (INDEPENDENT_AMBULATORY_CARE_PROVIDER_SITE_OTHER): Payer: 59 | Admitting: Internal Medicine

## 2010-07-03 DIAGNOSIS — I10 Essential (primary) hypertension: Secondary | ICD-10-CM

## 2010-07-03 DIAGNOSIS — F329 Major depressive disorder, single episode, unspecified: Secondary | ICD-10-CM

## 2010-07-03 DIAGNOSIS — M549 Dorsalgia, unspecified: Secondary | ICD-10-CM

## 2010-07-03 DIAGNOSIS — Z8669 Personal history of other diseases of the nervous system and sense organs: Secondary | ICD-10-CM | POA: Insufficient documentation

## 2010-07-11 NOTE — Assessment & Plan Note (Signed)
Summary: elev bp per dahlia/cd   Vital Signs:  Patient profile:   55 year old female Height:      68.5 inches Weight:      163.13 pounds BMI:     24.53 O2 Sat:      97 % on Room air Temp:     98 degrees F oral BP sitting:   132 / 82  (left arm) Cuff size:   regular  Vitals Entered By: Zella Ball Ewing CMA Duncan Dull) (July 03, 2010 2:57 PM)  O2 Flow:  Room air  CC: Elevated BP/RE   Primary Care Provider:  Oliver Barre, MD  CC:  Elevated BP/RE.  History of Present Illness: her to f/u BP;  saw optho last wk - tx with eye drops she's not sure of name - has had light sensitive , red watery right eye and seeing optho since nov 2011;  initaly tx with OTC anthist, this last time with what sound like steroid but was warned the med over extended use could possibly lead to HTN, but the plan was for limited rx < 2 wks tx only;; also had dental  appt last wk with mult SBP in the 150's, diast in the 110's - was referred here and teeth not cleaned; Pt denies CP, worsening sob, doe, wheezing, orthopnea, pnd, worsening LE edema, palps, dizziness or syncope  Pt denies new neuro symptoms such as headache, facial or extremity weakness  Pt denies polydipsia, polyuria.  Overall good compliance with meds, and good tolerability. Was seeing cardiology in [place] before moving here .  Incidently off the effexor for 3 wks due to [facility] issue.  Never did send the tramadol to [facility] as well - had done locally, and works ok for the recurrnet righ LBP that seems a results of her ongoing gait problem ;   Back pain no change o/w - no bowel or bladder change, fever, wt gain, fall or injury.   Denies worsening depressive symptoms, suicidal ideation, or panic, htough has some ongoing anxiety, not worse recently.    Preventive Screening-Counseling & Management      Drug Use:  no.    Problems Prior to Update: 1)  Syncope, Hx of  (ICD-V12.49) 2)  Osteopenia  (ICD-733.90) 3)  Other Abnormal Blood Chemistry  (ICD-790.6) 4)   Unspecified Hearing Loss  (ICD-389.9) 5)  Sinusitis, Acute  (ICD-461.9) 6)  Need Prophylactic Vaccination&inoculation Flu  (ICD-V04.81) 7)  Depression  (ICD-311) 8)  Fatigue  (ICD-780.79) 9)  Obstructive Sleep Apnea  (ICD-327.23) 10)  Hypersomnia  (ICD-780.54) 11)  Fm Hx Malignant Neoplasm Gastrointestinal Tract  (ICD-V16.0) 12)  Diverticulosis, Colon  (ICD-562.10) 13)  Knee Pain, Right  (ICD-719.46) 14)  Back Pain  (ICD-724.5) 15)  Menopausal Syndrome  (ICD-627.2) 16)  Hyperthyroidism  (ICD-242.90) 17)  Goiter, Multinodular  (ICD-241.1) 18)  Weight Loss  (ICD-783.21) 19)  Low Back Pain  (ICD-724.2) 20)  Preventive Health Care  (ICD-V70.0) 21)  Hypertension  (ICD-401.9) 22)  Hyperlipidemia  (ICD-272.4) 23)  Colonic Polyps, Hx of  (ICD-V12.72) 24)  Anemia-iron Deficiency  (ICD-280.9)  Medications Prior to Update: 1)  Cartia Xt 240 Mg  Cp24 (Diltiazem Hcl Coated Beads) .Marland Kitchen.. 1po Once Daily 2)  Guanfacine Hcl 1 Mg  Tabs (Guanfacine Hcl) .Marland Kitchen.. 1 By Mouth Once Daily 3)  Furosemide 80 Mg  Tabs (Furosemide) .Marland Kitchen.. 1 By Mouth Once Daily 4)  Klor-Con 20 Meq  Pack (Potassium Chloride) .Marland Kitchen.. 1 By Mouth Once Daily 5)  Lipitor 10 Mg Tabs (Atorvastatin  Calcium) .Marland Kitchen.. 1po Once Daily 6)  Ultram Er 300 Mg Xr24h-Tab (Tramadol Hcl) .Marland Kitchen.. 1 By Mouth Once Daily - To Fill Jun 22, 2010 7)  Adult Aspirin Ec Low Strength 81 Mg  Tbec (Aspirin) .Marland Kitchen.. 1po Qd 8)  Vitamin D3 1000 Unit  Tabs (Cholecalciferol) .... 2 Po Once Daily 9)  Calcium Citrate + D 300-100 Mg-Unit Tabs (Calcium Citrate-Vitamin D) .Marland Kitchen.. 1 Tablet By Mouth Once Daily 10)  Omega-3 350 Mg Caps (Omega-3 Fatty Acids) .Marland Kitchen.. 1 Tablet By Mouth Once Daily 11)  Effexor Xr 75 Mg Xr24h-Cap (Venlafaxine Hcl) .Marland Kitchen.. 1 Daily 12)  Menaquil (Herbal) .... Take 1 By Mouth Once Daily For Menopause  Current Medications (verified): 1)  Cartia Xt 240 Mg  Cp24 (Diltiazem Hcl Coated Beads) .Marland Kitchen.. 1po Once Daily 2)  Guanfacine Hcl 1 Mg  Tabs (Guanfacine Hcl) .Marland Kitchen.. 1 By Mouth Once  Daily 3)  Furosemide 80 Mg  Tabs (Furosemide) .Marland Kitchen.. 1 By Mouth Once Daily 4)  Klor-Con 20 Meq  Pack (Potassium Chloride) .Marland Kitchen.. 1 By Mouth Once Daily 5)  Lipitor 10 Mg Tabs (Atorvastatin Calcium) .Marland Kitchen.. 1po Once Daily 6)  Ultram Er 300 Mg Xr24h-Tab (Tramadol Hcl) .Marland Kitchen.. 1 By Mouth Once Daily - To Fill Jun 22, 2010 7)  Adult Aspirin Ec Low Strength 81 Mg  Tbec (Aspirin) .Marland Kitchen.. 1po Qd 8)  Vitamin D3 1000 Unit  Tabs (Cholecalciferol) .... 2 Po Once Daily 9)  Calcium Citrate + D 300-100 Mg-Unit Tabs (Calcium Citrate-Vitamin D) .Marland Kitchen.. 1 Tablet By Mouth Once Daily 10)  Omega-3 350 Mg Caps (Omega-3 Fatty Acids) .Marland Kitchen.. 1 Tablet By Mouth Once Daily 11)  Effexor Xr 75 Mg Xr24h-Cap (Venlafaxine Hcl) .Marland Kitchen.. 1 Daily By Mouth 12)  Menaquil (Herbal) .... Take 1 By Mouth Once Daily For Menopause 13)  Lisinopril 10 Mg Tabs (Lisinopril) .Marland Kitchen.. 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   Head:  normocephalic and atraumatic.   Eyes:  vision grossly intact, pupils equal, and pupils round.   Ears:  R ear normal and L ear normal.   Nose:  no external deformity and no nasal discharge.   Mouth:  no gingival abnormalities and pharynx pink and moist.   Neck:  supple and no masses.   Lungs:  normal respiratory effort and normal breath sounds.   Heart:  normal rate and regular rhythm.   Abdomen:  soft, non-tender, and normal bowel sounds.   Msk:  no joint tenderness and no joint swelling, but does have mild diffuse lumbar bilat paravertebral tenderness Extremities:  no edema, no erythema  Neurologic:  cranial nerves II-XII intact and strength normal in all extremities.  gait normal and DTRs symmetrical and normal.  ;  neg SLR bilat Skin:  no rashes.   Psych:  not depressed appearing and slightly anxious.     Impression & Recommendations:  Problem # 1:  HYPERTENSION (ICD-401.9)  Her updated medication list for this problem includes:    Cartia Xt 240 Mg Cp24 (Diltiazem hcl coated beads) .Marland Kitchen... 1po once daily    Guanfacine Hcl 1 Mg Tabs (Guanfacine hcl) .Marland Kitchen... 1 by mouth once daily    Furosemide 80 Mg Tabs (Furosemide) .Marland Kitchen... 1 by mouth once daily    Lisinopril 10 Mg Tabs (Lisinopril) .Marland Kitchen... 1po once daily  BP today: 132/82 Prior BP: 110/78 (01/04/2010)  Labs Reviewed: K+: 5.6 (01/02/2010) Creat: : 0.9 (01/02/2010)   Chol: 172 (01/02/2010)   HDL: 81.70 (01/02/2010)   LDL: 86 (01/02/2010)   TG: 22.0 (01/02/2010) mild uncontrolled - to add ACE as above, /f/u labs in 2 wks; has hx of syncope with accelerated BP in the past and pt asks for cardiology evaluation -   Orders: Cardiology Referral (Cardiology)  Problem # 2:  LOW BACK PAIN (ICD-724.2)  Her updated medication list for this problem  includes:    Ultram Er 300 Mg Xr24h-tab (Tramadol hcl) .Marland Kitchen... 1 by mouth once daily - to fill Jun 22, 2010    Adult Aspirin Ec Low Strength 81 Mg Tbec (Aspirin) .Marland Kitchen... 1po qd stable overall by hx and exam, ok to continue meds/tx as is; - to refill med today  To be seen in 2 weeks if no improvement; sooner if worsening of symptoms.   Problem # 3:  DEPRESSION (ICD-311)  Her updated medication list for this problem includes:    Effexor Xr 75 Mg Xr24h-cap (Venlafaxine hcl) .Marland Kitchen... 1 daily by mouth stable overall by hx and exam, ok to continue meds/tx as is  Discussed treatment options, including trial of antidpressant medication. Patient agrees to call if any worsening of symptoms or thoughts of doing harm arise. Verified that the patient has no suicidal ideation at this time.   Decliens counseling at this time  Complete Medication List: 1)  Cartia Xt 240 Mg Cp24 (Diltiazem hcl coated beads) .Marland Kitchen.. 1po once daily 2)  Guanfacine Hcl 1 Mg Tabs (Guanfacine hcl) .Marland Kitchen.. 1 by mouth once daily 3)  Furosemide 80 Mg Tabs (Furosemide) .Marland Kitchen.. 1 by mouth once daily 4)  Klor-con 20 Meq Pack (Potassium chloride) .Marland Kitchen.. 1 by mouth once daily 5)  Lipitor 10 Mg Tabs (Atorvastatin calcium) .Marland Kitchen.. 1po once daily 6)  Ultram Er 300 Mg Xr24h-tab (Tramadol hcl) .Marland Kitchen.. 1 by mouth once daily - to fill Jun 22, 2010 7)  Adult Aspirin Ec Low Strength 81 Mg Tbec (Aspirin) .Marland Kitchen.. 1po qd 8)  Vitamin D3 1000 Unit Tabs (Cholecalciferol) .... 2 po once daily 9)  Calcium Citrate + D 300-100 Mg-unit Tabs (Calcium citrate-vitamin d) .Marland Kitchen.. 1 tablet by mouth once daily 10)  Omega-3 350 Mg Caps (Omega-3 fatty acids) .Marland Kitchen.. 1 tablet by mouth once daily 11)  Effexor Xr 75 Mg Xr24h-cap (Venlafaxine hcl) .Marland Kitchen.. 1 daily by mouth 12)  Menaquil (herbal)  .... Take 1 by mouth once daily for menopause 13)  Lisinopril 10 Mg  Tabs (Lisinopril) .Marland Kitchen.. 1po once daily  Patient Instructions: 1)  Please take all new medications as prescribed 2)  Continue all previous  medications as before this visit  3)  Please followup with opthomology on mar 20, and the dentist as you have planned 4)  You will be contacted about the referral(s) to: cardiology  as you requested 5)  Plesae see Dr Glenna Fellows in June 2012 6)  you are due for colonscopy in August 2013 7)  Please return in 2 wks for LAB only:   BMET  401.1 - to check to make sure the kidneys are ok on the new medicaiton 8)  Please schedule a follow-up appointment in Sept 2012 for CPX with labs Prescriptions: EFFEXOR XR 75 MG XR24H-CAP (VENLAFAXINE HCL) 1 daily by mouth  #90 x 3   Entered and Authorized by:   Corwin Levins MD   Signed by:   Corwin Levins MD on 07/03/2010   Method used:   Print then Give to Patient   RxID:   [id number] ULTRAM ER 300 MG XR24H-TAB (TRAMADOL HCL) 1 by mouth once daily - to fill Jun 22, 2010  #90 x 1   Entered and Authorized by:   Corwin Levins MD   Signed by:   Corwin Levins MD on 07/03/2010   Method used:   Print then Give to Patient   RxID:   [id number] LISINOPRIL 10 MG TABS (LISINOPRIL) 1po once daily  #90 x 3   Entered and Authorized by:   Corwin Levins MD   Signed by:   Corwin Levins MD on 07/03/2010   Method used:   Print then Give to Patient   RxID:   [id number]    Orders Added: 1)  Cardiology Referral [Cardiology] 2)  Est. Patient Level IV [42595]

## 2010-07-14 ENCOUNTER — Encounter: Payer: Self-pay | Admitting: *Deleted

## 2010-07-20 ENCOUNTER — Other Ambulatory Visit: Payer: Self-pay | Admitting: Internal Medicine

## 2010-07-20 ENCOUNTER — Other Ambulatory Visit (INDEPENDENT_AMBULATORY_CARE_PROVIDER_SITE_OTHER): Payer: 59

## 2010-07-20 DIAGNOSIS — I10 Essential (primary) hypertension: Secondary | ICD-10-CM

## 2010-07-20 LAB — BASIC METABOLIC PANEL
BUN: 10 mg/dL (ref 6–23)
Chloride: 104 mEq/L (ref 96–112)
Creatinine, Ser: 0.9 mg/dL (ref 0.4–1.2)
Potassium: 5 mEq/L (ref 3.5–5.1)

## 2010-07-26 ENCOUNTER — Encounter: Payer: Self-pay | Admitting: Cardiology

## 2010-07-26 ENCOUNTER — Ambulatory Visit (INDEPENDENT_AMBULATORY_CARE_PROVIDER_SITE_OTHER): Payer: 59 | Admitting: Cardiology

## 2010-07-26 DIAGNOSIS — R609 Edema, unspecified: Secondary | ICD-10-CM

## 2010-07-26 DIAGNOSIS — R0602 Shortness of breath: Secondary | ICD-10-CM

## 2010-07-26 DIAGNOSIS — I10 Essential (primary) hypertension: Secondary | ICD-10-CM

## 2010-07-26 DIAGNOSIS — I509 Heart failure, unspecified: Secondary | ICD-10-CM

## 2010-07-26 DIAGNOSIS — E785 Hyperlipidemia, unspecified: Secondary | ICD-10-CM

## 2010-07-26 DIAGNOSIS — I5032 Chronic diastolic (congestive) heart failure: Secondary | ICD-10-CM

## 2010-07-26 DIAGNOSIS — I890 Lymphedema, not elsewhere classified: Secondary | ICD-10-CM | POA: Insufficient documentation

## 2010-07-26 MED ORDER — FUROSEMIDE 40 MG PO TABS
40.0000 mg | ORAL_TABLET | Freq: Every day | ORAL | Status: DC
Start: 2010-07-26 — End: 2011-01-08

## 2010-07-26 NOTE — Assessment & Plan Note (Signed)
Patient has history of venous insufficiency versus lymphedema.  She currently has only trace ankle edema.  I am not sure that she needs to be on such a high dose of Lasix.  She has mild exertional dyspnea with steps.  I am going to have her cut her Lasix in half to 40 mg daily.  I will get a BNP today and will also get an echo to assess for any evidence for systolic or diastolic CHF.  She will follow a 2000 mg sodium diet.  She will call us if cutting back on Lasix leads to any dyspnea or significant worsening of edema.

## 2010-07-26 NOTE — Assessment & Plan Note (Signed)
Lipids look good when checked in 9/11.

## 2010-07-26 NOTE — Progress Notes (Signed)
PCP: Dr. Jonny Ruiz  55 yo with history of hypertension and chronic lower extremity edema presents to establish cardiology followup.  Patient moved from [place] about 3 years ago.  She had been seeing a cardiologist there because of a history of resistant hypertension and severe lower extremity edema / possible lymphedema.  She has been on a high dose of Lasix (80 mg daily) for a number of years.  Her blood pressure has actually been under good control since starting lisinopril recently.  It is 124/86 today.  Her lower extremity edema is better now than it has been for the last few years.  She is able to walk up to 2 miles on flat ground without significant dyspnea.  She gets mildly short of breath with a flight of steps.  No chest pain.  No orthopnea.    ECG: NSR, normal  Labs (9/11): LDL 86, HDL 82 Labs (3/12): K 5, creatinine 0.9  PMH: 1. Colon polyps 2. Hyperlipidemia 3. HTN: Long history of HTN.  Saw cardiologist in [place] for resistant HTN.  Had stress test about 6 yrs ago that was unremarkable per her report. 4. Chronic venous insufficiency / ? Lymphedema 5. Low back pain/sciatica 6. Hyperthyroidism s/p radioactive iodine ablation. 7. Depression 8. History of sacroiliitis 9. TKR 2007 10. History of syncope 1995 11. Mild OSA  SH: Married, never smoked, lives in [place]  FH: Mother with atrial fibrillation, MI at age 58  ROS: All systems reviewed and negative except as per HPI.   Current Outpatient Prescriptions  Medication Sig Dispense Refill  . aspirin 81 MG tablet Take 81 mg by mouth daily.        Marland Kitchen atorvastatin (LIPITOR) 10 MG tablet Take 10 mg by mouth daily.        . Biotin 5000 MCG CAPS Take by mouth.        . Calcium Carbonate-Vitamin D 300-100 MG-UNIT CAPS Take by mouth. Take one daily        . Cholecalciferol (VITAMIN D3) 1000 UNITS CAPS Take 2,000 Units by mouth daily.        Marland Kitchen diltiazem (CARTIA XT) 240 MG 24 hr capsule Take 240 mg by mouth daily.        . fish  oil-omega-3 fatty acids 1000 MG capsule Take 1 g by mouth daily.        Marland Kitchen guanFACINE (TENEX) 1 MG tablet Take 1 mg by mouth at bedtime.        Marland Kitchen lisinopril (PRINIVIL,ZESTRIL) 10 MG tablet Take 10 mg by mouth daily.        . Multiple Vitamin (MULTIVITAMIN) capsule Take 1 capsule by mouth daily.        . potassium chloride SA (K-DUR,KLOR-CON) 20 MEQ tablet Take 20 mEq by mouth daily.        . traMADol (ULTRAM-ER) 300 MG 24 hr tablet Take 300 mg by mouth daily.        Marland Kitchen DISCONTD: furosemide (LASIX) 80 MG tablet Take 80 mg by mouth daily.        . furosemide (LASIX) 40 MG tablet Take 1 tablet (40 mg total) by mouth daily.  30 tablet  6  . NON FORMULARY Take by mouth daily. Menaquil (herbal)- for menopause       . DISCONTD: venlafaxine (EFFEXOR) 75 MG tablet Take 75 mg by mouth daily.          BP 124/86  Pulse 64  Ht 5\' 8"  (1.727 m)  Wt 161 lb (73.029  kg)  BMI 24.48 kg/m2 General: NAD Neck: No JVD, no thyromegaly or thyroid nodule.  Lungs: Clear to auscultation bilaterally with normal respiratory effort. CV: Nondisplaced PMI.  Heart regular S1/S2, no S3/S4, no murmur.  Trace ankle edema.  No carotid bruit.  Normal pedal pulses.  Abdomen: Soft, nontender, no hepatosplenomegaly, no distention.  Skin: Intact without lesions or rashes.  Neurologic: Alert and oriented x 3.  Psych: Normal affect. Extremities: No clubbing or cyanosis.  HEENT: Normal.

## 2010-07-26 NOTE — Patient Instructions (Signed)
Decrease Lasix to 40mg  daily.  Lab today--BNP 428.32  401.9 786.09  Schedule an appointment for an echocardiogram.  Limit your sodium intake to less than 2000mg  daily. See handout given to you today.  Schedule an appointment with Dr Shirlee Latch for 6 months.(October 2012)

## 2010-07-26 NOTE — Assessment & Plan Note (Signed)
Per patient, she has had resistant HTN.  BP actually looks quite good today.  Continue diltiazem CD and lisinopril.

## 2010-08-04 ENCOUNTER — Other Ambulatory Visit (HOSPITAL_COMMUNITY): Payer: 59 | Admitting: Radiology

## 2010-08-08 ENCOUNTER — Ambulatory Visit (HOSPITAL_COMMUNITY): Payer: 59 | Attending: Cardiology

## 2010-08-08 DIAGNOSIS — R0602 Shortness of breath: Secondary | ICD-10-CM

## 2010-08-08 DIAGNOSIS — I079 Rheumatic tricuspid valve disease, unspecified: Secondary | ICD-10-CM | POA: Insufficient documentation

## 2010-08-08 DIAGNOSIS — R0989 Other specified symptoms and signs involving the circulatory and respiratory systems: Secondary | ICD-10-CM | POA: Insufficient documentation

## 2010-08-08 DIAGNOSIS — I059 Rheumatic mitral valve disease, unspecified: Secondary | ICD-10-CM | POA: Insufficient documentation

## 2010-08-08 DIAGNOSIS — I5032 Chronic diastolic (congestive) heart failure: Secondary | ICD-10-CM

## 2010-08-08 DIAGNOSIS — E785 Hyperlipidemia, unspecified: Secondary | ICD-10-CM | POA: Insufficient documentation

## 2010-08-08 DIAGNOSIS — I10 Essential (primary) hypertension: Secondary | ICD-10-CM | POA: Insufficient documentation

## 2010-08-08 DIAGNOSIS — R0609 Other forms of dyspnea: Secondary | ICD-10-CM | POA: Insufficient documentation

## 2010-08-22 ENCOUNTER — Telehealth: Payer: Self-pay

## 2010-08-22 DIAGNOSIS — M545 Low back pain, unspecified: Secondary | ICD-10-CM

## 2010-08-22 NOTE — Telephone Encounter (Signed)
Pt is requesting referral to Dr Claria Dice at [facility] for persistent lower back pain.

## 2010-08-22 NOTE — Telephone Encounter (Signed)
Done per emr 

## 2010-08-30 ENCOUNTER — Other Ambulatory Visit: Payer: Self-pay

## 2010-08-30 MED ORDER — LISINOPRIL 10 MG PO TABS: 10.0000 mg | ORAL_TABLET | Freq: Every day | ORAL | Status: DC

## 2010-09-05 NOTE — Procedures (Signed)
NAMESHIRLETTE, SCARBER              ACCOUNT NO.:  [id number]   MEDICAL RECORD NO.:  [id number]          PATIENT TYPE:  OUT   LOCATION:  SLEEP CENTER                 FACILITY[facility]  [facility]   PHYSICIAN:  Barbaraann Share, MD,FCCPDATE OF BIRTH:  09[birth year]23[birth year]1953   DATE OF STUDY:  12/15/2008                            NOCTURNAL POLYSOMNOGRAM   REFERRING PHYSICIAN:   LOCATION:  Sleep lab.   REFERRING PHYSICIAN:  Barbaraann Share, MD, FCCP   INDICATION FOR STUDY:  Hypersomnia with sleep apnea.   EPWORTH SLEEPINESS SCORE:  14.   MEDICATIONS:   SLEEP ARCHITECTURE:  The patient had total sleep time of 221 minutes  with decreased quantity of slow wave sleep and only 41 minutes of REM.  Sleep onset latency was very prolonged at 801 minutes, and REM onset was  very prolonged at 222 minutes.  Sleep efficiency was very poor at 59%.   RESPIRATORY DATA:  The the patient was found to have 17 apneas and 2  obstructive hypopneas, giving her an AHI of only 5 events per hour.  However, she was also noted to have large numbers of respiratory effort  related arousals numbering 61, thereby giving her a RDI of 22 events per  hour.  The events were not positional and there was moderate snoring  noted throughout.   OXYGEN DATA:  There was O2 desaturation as low as 92% with the patient's  obstructive events.   CARDIAC DATA:  No clinically significant arrhythmias were seen.   MOVEMENT-PARASOMNIA:  There were no significant leg jerks or abnormal  behaviors noted.   IMPRESSIONS-RECOMMENDATIONS:  Mild obstructive sleep apnea/hypopnea  syndrome with an AHI of 5 events per hour, and a RDI of 22 events per  hour.  There was O2 desaturation as low as 92%.  Treatment for this  degree of sleep apnea can include a trial of weight loss alone if  applicable, upper airway surgery, dental appliance, and also CPAP.  Clinical correlation is suggested.      Barbaraann Share, MD,FCCP  Diplomate, American Board of Sleep  Medicine  Electronically Signed     [facility]MED[facility]  D:  12/21/2008 16:30:39  T:  12/22/2008 05:07:14  Job:  [id number]

## 2010-10-03 ENCOUNTER — Other Ambulatory Visit: Payer: Self-pay

## 2010-10-03 MED ORDER — TRAMADOL HCL (ER BIPHASIC) 200 MG PO CP24: 1.0000 | ORAL_CAPSULE | Freq: Every day | ORAL | Status: DC | PRN

## 2010-10-03 NOTE — Telephone Encounter (Signed)
Pt left message on triage stating that cardiologist changed dosage of Ultram to 200mg  once daily

## 2010-10-03 NOTE — Telephone Encounter (Signed)
Left message for pt to callback office.  

## 2010-10-03 NOTE — Telephone Encounter (Signed)
Done per emr 

## 2010-10-03 NOTE — Telephone Encounter (Signed)
Pt informed

## 2010-10-18 ENCOUNTER — Other Ambulatory Visit (INDEPENDENT_AMBULATORY_CARE_PROVIDER_SITE_OTHER): Payer: 59

## 2010-10-18 ENCOUNTER — Ambulatory Visit (INDEPENDENT_AMBULATORY_CARE_PROVIDER_SITE_OTHER): Payer: 59 | Admitting: Endocrinology

## 2010-10-18 ENCOUNTER — Encounter: Payer: Self-pay | Admitting: Endocrinology

## 2010-10-18 VITALS — BP 134/86 | HR 92 | Temp 98.5°F | Ht 68.0 in | Wt 159.8 lb

## 2010-10-18 DIAGNOSIS — E876 Hypokalemia: Secondary | ICD-10-CM

## 2010-10-18 DIAGNOSIS — R7309 Other abnormal glucose: Secondary | ICD-10-CM

## 2010-10-18 DIAGNOSIS — E042 Nontoxic multinodular goiter: Secondary | ICD-10-CM

## 2010-10-18 DIAGNOSIS — R739 Hyperglycemia, unspecified: Secondary | ICD-10-CM | POA: Insufficient documentation

## 2010-10-18 LAB — BASIC METABOLIC PANEL
CO2: 30 mEq/L (ref 19–32)
Calcium: 9.3 mg/dL (ref 8.4–10.5)
Creatinine, Ser: 0.8 mg/dL (ref 0.4–1.2)
Glucose, Bld: 156 mg/dL — ABNORMAL HIGH (ref 70–99)

## 2010-10-18 NOTE — Patient Instructions (Addendum)
blood tests are being ordered for you today.  please call [phone] to hear your test results.  You will be prompted to enter the 9-digit "MRN" number that appears at the top left of this page, followed by #.  Then you will hear the message. Based on the results, i may recommend reducing or stopping the potassium pill. Please return in 1 year.  It is possible that your overactive thyroid could come back, but this may take many years. (update: i left message on phone-tree:  Please continue the potassuim pill.  No thyroid rx is needed.  Consider metformin).

## 2010-10-18 NOTE — Progress Notes (Signed)
Subjective:    Patient ID: Penny Yates, female    DOB: 07/01/1955, 55 y.o.   MRN: [id number]  HPI pt is now 3 years s/p i-131 rx for hyperthyroidism due to a multinodular goiter.  she has not required any thyroid medication.  Pt was noted in 2011 to have mild hyperglycemia.  No weight change. Pt takes kdur as rx'ed.  She reports few years mildly decreased muscle weakness in the legs, but no assoc numbness. Past Medical History  Diagnosis Date  . Anemia   . History of colonic polyps     multiple of succeeding years 96, 97, 98, and 99  . Hyperlipidemia   . Hypertension   . Chronic venous insufficiency     LE's  . Low back pain   . Sciatica   . Leg length discrepancy     right leg longer  . Hyperthyroidism     s/p rad Iodine-Lolly Glaus  . Sacroiliitis     history of  . Depression     Past Surgical History  Procedure Date  . Tubal ligation   . 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 Revision of scar tissue rectus muscle 11/08    right knee    History   Social History  . Marital Status: Married    Spouse Name: N/A    Number of Children: N/A  . Years of Education: N/A   Occupational History  . Not on file.   Social History Main Topics  . Smoking status: Never Smoker   . Smokeless tobacco: Not on file  . Alcohol Use: No  . Drug Use: No  . Sexually Active: Not on file   Other Topics Concern  . Not on file   Social History Narrative   Moved to [place] in Jan 2009. Married with children. 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 Looking for a new job.    Current Outpatient Prescriptions on File Prior to Visit  Medication Sig Dispense Refill  . aspirin 81 MG tablet Take 81 mg by mouth daily.        Marland Kitchen atorvastatin (LIPITOR) 10 MG tablet Take 10 mg by mouth daily.        . Biotin 5000 MCG CAPS Take by mouth.        . Calcium Carbonate-Vitamin D 300-100 MG-UNIT CAPS Take by mouth. Take one daily       . Cholecalciferol (VITAMIN D3) 1000 UNITS CAPS Take 2,000 Units by mouth daily.        Marland Kitchen diltiazem (CARTIA XT) 240 MG 24 hr capsule Take 240 mg by mouth daily.        . fish oil-omega-3 fatty acids 1000 MG capsule Take 1 capsule by mouth daily.       . furosemide (LASIX) 40 MG tablet Take 1 tablet (40 mg total) by mouth daily.  30 tablet  6  . guanFACINE (TENEX) 1 MG tablet Take 1 mg by mouth at bedtime.        Marland Kitchen lisinopril (PRINIVIL,ZESTRIL) 10 MG tablet Take 1 tablet (10 mg total) by mouth daily.  90 tablet  3  . Multiple Vitamin (MULTIVITAMIN) capsule Take 1 capsule by mouth daily.        . potassium chloride SA (K-DUR,KLOR-CON) 20 MEQ tablet Take 20 mEq by mouth daily.        . TraMADol HCl 200 MG CP24 Take 1 tablet by  mouth daily as needed.  30 capsule  2  . DISCONTD: NON FORMULARY Take by mouth daily. Menaquil (herbal)- for menopause         No Known Allergies  Family History  Problem Relation Age of Onset  . Colon cancer Mother   . Diabetes Mother   . Cancer Mother     Colon Cancer  . Diabetes      mother and several others in family  . Heart disease Maternal Grandmother   . Stroke    . Kidney disease    . Thyroid disease Neg Hx     BP 134/86  Pulse 92  Temp(Src) 98.5 F (36.9 C) (Oral)  Ht 5\' 8"  (1.727 m)  Wt 159 lb 12.8 oz (72.485 kg)  BMI 24.30 kg/m2  SpO2 98%  Review of Systems Denies weight change.  She has fatigue.    Objective:   Physical Exam GENERAL: no distress. Neck: there is a 1 cm right thyroid nodule. Gait: normal and steady.    Lab Results  Component Value Date   TSH 3.15 10/18/2010    Lab Results  Component Value Date   HGBA1C 6.8* 10/18/2010  K+=3.6    Assessment & Plan:  Multinodular goiter, euthyroid s/p i-131 rx Dm, worse Hypokalemia, well-replaced

## 2010-10-20 ENCOUNTER — Telehealth: Payer: Self-pay

## 2010-10-20 MED ORDER — METFORMIN HCL ER 500 MG PO TB24: 500.0000 mg | ORAL_TABLET | Freq: Every day | ORAL | Status: DC

## 2010-10-20 NOTE — Telephone Encounter (Signed)
Left message on machine for pt to return my call  

## 2010-10-20 NOTE — Telephone Encounter (Signed)
Yes, this is the firt blood test to show yo have diabetes.  You should take metformin, and work on diet and exercise.

## 2010-10-20 NOTE — Telephone Encounter (Signed)
i sent rx 

## 2010-10-20 NOTE — Telephone Encounter (Signed)
Pt called stating she received her lab results on the phone tree and is concerned about A1C levels. Pt would like MD to advise if she is now a Diabetic or if she just has hyperglycemia? Pt is also requesting Rx to [facility] as advised on message.

## 2010-10-20 NOTE — Telephone Encounter (Signed)
Pt informed of below.  Please advise on sig and quantity for Metformin

## 2010-12-30 ENCOUNTER — Other Ambulatory Visit: Payer: Self-pay | Admitting: Internal Medicine

## 2010-12-30 DIAGNOSIS — Z0000 Encounter for general adult medical examination without abnormal findings: Secondary | ICD-10-CM

## 2011-01-03 ENCOUNTER — Other Ambulatory Visit: Payer: 59

## 2011-01-05 ENCOUNTER — Other Ambulatory Visit (INDEPENDENT_AMBULATORY_CARE_PROVIDER_SITE_OTHER): Payer: 59

## 2011-01-05 ENCOUNTER — Other Ambulatory Visit: Payer: Self-pay | Admitting: Internal Medicine

## 2011-01-05 ENCOUNTER — Encounter: Payer: Self-pay | Admitting: Internal Medicine

## 2011-01-05 DIAGNOSIS — Z862 Personal history of diseases of the blood and blood-forming organs and certain disorders involving the immune mechanism: Secondary | ICD-10-CM

## 2011-01-05 DIAGNOSIS — R7309 Other abnormal glucose: Secondary | ICD-10-CM

## 2011-01-05 DIAGNOSIS — Z0000 Encounter for general adult medical examination without abnormal findings: Secondary | ICD-10-CM

## 2011-01-05 DIAGNOSIS — Z8639 Personal history of other endocrine, nutritional and metabolic disease: Secondary | ICD-10-CM

## 2011-01-05 LAB — URINALYSIS, ROUTINE W REFLEX MICROSCOPIC
Bilirubin Urine: NEGATIVE
Hgb urine dipstick: NEGATIVE
Urine Glucose: NEGATIVE
Urobilinogen, UA: 0.2 (ref 0.0–1.0)

## 2011-01-08 ENCOUNTER — Encounter: Payer: Self-pay | Admitting: Internal Medicine

## 2011-01-08 ENCOUNTER — Other Ambulatory Visit: Payer: 59

## 2011-01-08 ENCOUNTER — Ambulatory Visit (INDEPENDENT_AMBULATORY_CARE_PROVIDER_SITE_OTHER): Payer: 59 | Admitting: Internal Medicine

## 2011-01-08 VITALS — BP 138/88 | HR 76 | Temp 98.2°F | Wt 158.0 lb

## 2011-01-08 DIAGNOSIS — R0602 Shortness of breath: Secondary | ICD-10-CM

## 2011-01-08 DIAGNOSIS — E042 Nontoxic multinodular goiter: Secondary | ICD-10-CM

## 2011-01-08 DIAGNOSIS — R739 Hyperglycemia, unspecified: Secondary | ICD-10-CM | POA: Insufficient documentation

## 2011-01-08 DIAGNOSIS — I509 Heart failure, unspecified: Secondary | ICD-10-CM

## 2011-01-08 DIAGNOSIS — Z23 Encounter for immunization: Secondary | ICD-10-CM

## 2011-01-08 DIAGNOSIS — Z0000 Encounter for general adult medical examination without abnormal findings: Secondary | ICD-10-CM

## 2011-01-08 DIAGNOSIS — I5032 Chronic diastolic (congestive) heart failure: Secondary | ICD-10-CM

## 2011-01-08 LAB — LIPID PANEL
HDL: 96.3 mg/dL (ref >39.00)
LDL Cholesterol: 86 mg/dL (ref 0–99)
Total CHOL/HDL Ratio: 2

## 2011-01-08 LAB — B12 AND FOLATE PANEL: Vitamin B-12: 469 pg/mL (ref 211–911)

## 2011-01-08 LAB — COMPREHENSIVE METABOLIC PANEL
AST: 21 U/L (ref 0–37)
Albumin: 4 g/dL (ref 3.5–5.2)
BUN: 12 mg/dL (ref 6–23)
Calcium: 9.8 mg/dL (ref 8.4–10.5)
Chloride: 106 mEq/L (ref 96–112)
Glucose, Bld: 113 mg/dL — ABNORMAL HIGH (ref 70–99)
Potassium: 4.3 mEq/L (ref 3.5–5.1)
Total Protein: 7.3 g/dL (ref 6.0–8.3)

## 2011-01-08 LAB — CBC WITH DIFFERENTIAL/PLATELET
Basophils Relative: 0.8 % (ref 0.0–3.0)
Eosinophils Relative: 2.1 % (ref 0.0–5.0)
Lymphocytes Relative: 42 % (ref 12.0–46.0)
Neutrophils Relative %: 48.5 % (ref 43.0–77.0)
RBC: 4.63 Mil/uL (ref 3.87–5.11)
WBC: 6 10*3/uL (ref 4.5–10.5)

## 2011-01-08 LAB — IBC PANEL: Transferrin: 221.1 mg/dL (ref 212.0–360.0)

## 2011-01-08 LAB — HEMOGLOBIN A1C: Hgb A1c MFr Bld: 5.8 % (ref 4.6–6.5)

## 2011-01-08 MED ORDER — GUANFACINE HCL 1 MG PO TABS: 1.0000 mg | ORAL_TABLET | Freq: Every day | ORAL | Status: DC

## 2011-01-08 MED ORDER — ATORVASTATIN CALCIUM 10 MG PO TABS: 10.0000 mg | ORAL_TABLET | Freq: Every day | ORAL | Status: DC

## 2011-01-08 MED ORDER — LISINOPRIL 20 MG PO TABS: 20.0000 mg | ORAL_TABLET | Freq: Every day | ORAL | Status: DC

## 2011-01-08 MED ORDER — DILTIAZEM HCL ER COATED BEADS 240 MG PO CP24: 240.0000 mg | ORAL_CAPSULE | Freq: Every day | ORAL | Status: DC

## 2011-01-08 MED ORDER — VENLAFAXINE HCL ER 37.5 MG PO CP24: 37.5000 mg | ORAL_CAPSULE | Freq: Every day | ORAL | Status: DC

## 2011-01-08 MED ORDER — POTASSIUM CHLORIDE CRYS ER 20 MEQ PO TBCR: 20.0000 meq | EXTENDED_RELEASE_TABLET | Freq: Every day | ORAL | Status: DC

## 2011-01-08 MED ORDER — FUROSEMIDE 40 MG PO TABS: 40.0000 mg | ORAL_TABLET | Freq: Every day | ORAL | Status: DC

## 2011-01-08 MED ORDER — TRAMADOL HCL (ER BIPHASIC) 200 MG PO CP24: 1.0000 | ORAL_CAPSULE | Freq: Every day | ORAL | Status: DC | PRN

## 2011-01-08 NOTE — Assessment & Plan Note (Signed)
Mild, has not started the OHA yet as per endo; for DM education referral

## 2011-01-08 NOTE — Assessment & Plan Note (Signed)

## 2011-01-08 NOTE — Patient Instructions (Addendum)
You had the flu shot today Please call the phone number [phone] (the PhoneTree System) for results of testing in 2-3 days;  When calling, simply dial the number, and when prompted enter the MRN number above (the Medical Record Number) and the # key, then the message should start. Increase the lisinopril to 20 mg for better blood pressure Your tramadol 200 mg, and other refills were sent in as well You will be contacted regarding the referral for: Diabetes education Please return in 6 mo with Lab testing done 3-5 days before

## 2011-01-08 NOTE — Progress Notes (Signed)
Subjective:    Patient ID: Penny Yates, female    DOB: Jan 04, 1956, 55 y.o.   MRN: [id number]  HPI  Here for wellness and f/u;  Overall doing ok;  Pt denies CP, worsening SOB, DOE, wheezing, orthopnea, PND, worsening LE edema, palpitations, dizziness or syncope.  Pt denies neurological change such as new Headache, facial or extremity weakness.  Pt denies polydipsia, polyuria, or low sugar symptoms. Pt states overall good compliance with treatment and medications, good tolerability, and trying to follow lower cholesterol diet.  Pt denies worsening depressive symptoms, suicidal ideation or panic. No fever, wt loss, night sweats, loss of appetite, or other constitutional symptoms.  Pt states good ability with ADL's, low fall risk, home safety reviewed and adequate, no significant changes in hearing or vision, and occasionally active with exercise.  Did not feel comfortable with starting the DM med per Dr Everardo All as she has not been to DM education.  Was dx with anemia per GYN last month (not sure of other details), Does have sense of ongoing fatigue, but denies signficant hypersomnolence as long as she is busy.  Pt continues to have recurring LBP without change in severity, bowel or bladder change, fever, wt loss,  worsening LE pain/numbness/weakness, gait change or falls, has seen pain management, had 3 ESI, and is now down 200 mg per day prn.   Also requests re-start on the effexor low dose for the ongoing hot flashes, did well previously on this tx. Past Medical History  Diagnosis Date  . Anemia   . History of colonic polyps     multiple of succeeding years 96, 97, 98, and 99  . Hyperlipidemia   . Hypertension   . Chronic venous insufficiency     LE's  . Low back pain   . Sciatica   . Leg length discrepancy     right leg longer  . Hyperthyroidism     s/p rad Iodine-ellison  . Sacroiliitis     history of  . Depression    Past Surgical History  Procedure Date  . Tubal ligation   .  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 Revision of scar tissue rectus muscle 11/08    right knee    reports that she has never smoked. She does not have any smokeless tobacco history on file. She reports that she does not drink alcohol or use illicit drugs. family history includes Cancer in her mother; Colon cancer in her mother; Diabetes in her mother and unspecified family member; Heart disease in her maternal grandmother; Kidney disease in an unspecified family member; and Stroke in an unspecified family member.  There is no history of Thyroid disease. No Known Allergies Current Outpatient Prescriptions on File Prior to Visit  Medication Sig Dispense Refill  . aspirin 81 MG tablet Take 81 mg by mouth daily.        Marland Kitchen atorvastatin (LIPITOR) 10 MG tablet Take 10 mg by mouth daily.        . Biotin 5000 MCG CAPS Take by mouth.        . Calcium Carbonate-Vitamin D 300-100 MG-UNIT CAPS Take by mouth. Take one daily        . Cholecalciferol (VITAMIN D3) 1000 UNITS CAPS Take 2,000 Units by mouth daily.        Marland Kitchen diltiazem (CARTIA XT) 240 MG 24 hr capsule Take 240 mg by mouth daily.        . fish oil-omega-3 fatty acids 1000  MG capsule Take 1 capsule by mouth daily.       . furosemide (LASIX) 40 MG tablet Take 1 tablet (40 mg total) by mouth daily.  30 tablet  6  . guanFACINE (TENEX) 1 MG tablet Take 1 mg by mouth at bedtime.        . Multiple Vitamin (MULTIVITAMIN) capsule Take 1 capsule by mouth daily.        . potassium chloride SA (K-DUR,KLOR-CON) 20 MEQ tablet Take 20 mEq by mouth daily.        . TraMADol HCl 200 MG CP24 Take 1 tablet by mouth daily as needed.  30 capsule  2  . metFORMIN (GLUCOPHAGE-XR) 500 MG 24 hr tablet Take 1 tablet (500 mg total) by mouth daily with breakfast.  30 tablet  11   Review of Systems Review of Systems  Constitutional: Negative for diaphoresis, activity change, appetite change and unexpected weight change.  HENT: Negative for hearing loss, ear pain,  facial swelling, mouth sores and neck stiffness.   Eyes: Negative for pain, redness and visual disturbance.  Respiratory: Negative for shortness of breath and wheezing.   Cardiovascular: Negative for chest pain and palpitations.  Gastrointestinal: Negative for diarrhea, blood in stool, abdominal distention and rectal pain.  Genitourinary: Negative for hematuria, flank pain and decreased urine volume.  Musculoskeletal: Negative for myalgias and joint swelling.  Skin: Negative for color change and wound.  Neurological: Negative for syncope and numbness.  Hematological: Negative for adenopathy.  Psychiatric/Behavioral: Negative for hallucinations, self-injury, decreased concentration and agitation.      Objective:   Physical Exam BP 138/88  Pulse 76  Temp(Src) 98.2 F (36.8 C) (Oral)  Wt 158 lb (71.668 kg)  SpO2 97% Physical Exam  VS noted Constitutional: Pt is oriented to person, place, and time. Appears well-developed and well-nourished.  HENT:  Head: Normocephalic and atraumatic.  Right Ear: External ear normal.  Left Ear: External ear normal.  Nose: Nose normal.  Mouth/Throat: Oropharynx is clear and moist.  Eyes: Conjunctivae and EOM are normal. Pupils are equal, round, and reactive to light.  Neck: Normal range of motion. Neck supple. No JVD present. No tracheal deviation present.  Cardiovascular: Normal rate, regular rhythm, normal heart sounds and intact distal pulses.   Pulmonary/Chest: Effort normal and breath sounds normal.  Abdominal: Soft. Bowel sounds are normal. There is no tenderness.  Musculoskeletal: Normal range of motion. Exhibits no edema.  Lymphadenopathy:  Has no cervical adenopathy.  Neurological: Pt is alert and oriented to person, place, and time. Pt has normal reflexes. No cranial nerve deficit.  Skin: Skin is warm and dry. No rash noted.  Psychiatric:  Has  normal mood and affect. Behavior is normal. somewhat irritable, 1+ nervous       Assessment &  Plan:

## 2011-04-28 IMAGING — NM NM BONE 3 PHASE
1 series · 6 of 6 positions shown · non-contrast
Comparison: None

CLINICAL DATA: Right knee pain, right knee replacement.  Question
loosening.

NUCLEAR MEDICINE THREE PHASE BONE SCAN
TECHNIQUE: Radionuclide angiographic images, immediate static
blood pool images, and 3-hour delayed static images were obtained
after intravenous injection of radiopharmaceutical.
Radiopharmaceutical: 23.4 mCi technetium 99m MDP IV.

[Series 1: fl flow and static · 9.41mm/px · 6 of 40 frames shown]
[frame 4/40  full-range]
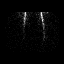
[frame 10/40  full-range]
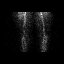
[frame 17/40  full-range]
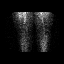
[frame 24/40  full-range]
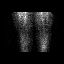
[frame 30/40  full-range]
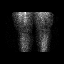
[frame 37/40  full-range]
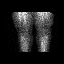

[6 of 6 positions shown; findings below may reference images not displayed]

FINDINGS: Blood flow images centered over the knees show symmetric
blood flow bilaterally.  No significant abnormal areas of increased
uptake on the blood pool images.  Delayed images demonstrates
increased activity along the tibial plateaus.
IMPRESSION: Increased activity in the proximal tibia around the tibial
component on delayed images only.  This is nonspecific and could be
stress mediated.  Recommend correlation with plain films.

## 2011-04-29 ENCOUNTER — Other Ambulatory Visit: Payer: Self-pay | Admitting: Internal Medicine

## 2011-04-30 NOTE — Telephone Encounter (Signed)
Done per emr 

## 2011-06-28 ENCOUNTER — Encounter: Payer: Self-pay | Admitting: Internal Medicine

## 2011-08-03 ENCOUNTER — Other Ambulatory Visit: Payer: Self-pay | Admitting: Internal Medicine

## 2011-08-03 NOTE — Telephone Encounter (Signed)
Done per emr 

## 2011-11-12 ENCOUNTER — Other Ambulatory Visit: Payer: Self-pay | Admitting: Internal Medicine

## 2011-11-12 NOTE — Telephone Encounter (Signed)
Done per emr 

## 2012-01-29 ENCOUNTER — Telehealth: Payer: Self-pay

## 2012-01-29 DIAGNOSIS — Z0000 Encounter for general adult medical examination without abnormal findings: Secondary | ICD-10-CM

## 2012-01-29 NOTE — Telephone Encounter (Signed)
Put labs in

## 2012-02-12 ENCOUNTER — Encounter: Payer: Self-pay | Admitting: Internal Medicine

## 2012-02-12 ENCOUNTER — Ambulatory Visit (INDEPENDENT_AMBULATORY_CARE_PROVIDER_SITE_OTHER): Payer: BC Managed Care – PPO | Admitting: Internal Medicine

## 2012-02-12 ENCOUNTER — Other Ambulatory Visit (INDEPENDENT_AMBULATORY_CARE_PROVIDER_SITE_OTHER): Payer: BC Managed Care – PPO

## 2012-02-12 VITALS — BP 150/100 | HR 70 | Temp 97.7°F | Ht 68.5 in | Wt 167.5 lb

## 2012-02-12 DIAGNOSIS — Z0000 Encounter for general adult medical examination without abnormal findings: Secondary | ICD-10-CM

## 2012-02-12 DIAGNOSIS — Z23 Encounter for immunization: Secondary | ICD-10-CM

## 2012-02-12 DIAGNOSIS — R002 Palpitations: Secondary | ICD-10-CM | POA: Insufficient documentation

## 2012-02-12 DIAGNOSIS — Z86010 Personal history of colonic polyps: Secondary | ICD-10-CM

## 2012-02-12 LAB — CBC WITH DIFFERENTIAL/PLATELET
Basophils Absolute: 0 10*3/uL (ref 0.0–0.1)
HCT: 40.8 % (ref 36.0–46.0)
Hemoglobin: 13.5 g/dL (ref 12.0–15.0)
Lymphs Abs: 4 10*3/uL (ref 0.7–4.0)
MCHC: 33 g/dL (ref 30.0–36.0)
Monocytes Relative: 7.2 % (ref 3.0–12.0)
Neutro Abs: 2.8 10*3/uL (ref 1.4–7.7)
RDW: 14.9 % — ABNORMAL HIGH (ref 11.5–14.6)

## 2012-02-12 LAB — URINALYSIS, ROUTINE W REFLEX MICROSCOPIC
Bilirubin Urine: NEGATIVE
Hgb urine dipstick: NEGATIVE
Ketones, ur: NEGATIVE
Nitrite: NEGATIVE
Total Protein, Urine: NEGATIVE
Urine Glucose: NEGATIVE

## 2012-02-12 LAB — LIPID PANEL
Cholesterol: 193 mg/dL (ref 0–200)
HDL: 90.4 mg/dL (ref >39.00)
Triglycerides: 41 mg/dL (ref 0.0–149.0)
VLDL: 8.2 mg/dL (ref 0.0–40.0)

## 2012-02-12 LAB — BASIC METABOLIC PANEL
CO2: 32 mEq/L (ref 19–32)
Calcium: 9.8 mg/dL (ref 8.4–10.5)
Creatinine, Ser: 0.8 mg/dL (ref 0.4–1.2)

## 2012-02-12 LAB — HEPATIC FUNCTION PANEL
Albumin: 3.6 g/dL (ref 3.5–5.2)
Bilirubin, Direct: 0.1 mg/dL (ref 0.0–0.3)
Total Protein: 7.3 g/dL (ref 6.0–8.3)

## 2012-02-12 MED ORDER — IRBESARTAN 300 MG PO TABS: 300.0000 mg | ORAL_TABLET | Freq: Every day | ORAL | Status: DC

## 2012-02-12 MED ORDER — TRAMADOL HCL ER 200 MG PO TB24: 200.0000 mg | ORAL_TABLET | Freq: Every day | ORAL | Status: DC | PRN

## 2012-02-12 MED ORDER — POTASSIUM CHLORIDE CRYS ER 20 MEQ PO TBCR: 20.0000 meq | EXTENDED_RELEASE_TABLET | Freq: Every day | ORAL | Status: DC

## 2012-02-12 MED ORDER — GUANFACINE HCL 1 MG PO TABS: 1.0000 mg | ORAL_TABLET | Freq: Every day | ORAL | Status: DC

## 2012-02-12 MED ORDER — ATORVASTATIN CALCIUM 10 MG PO TABS: 10.0000 mg | ORAL_TABLET | Freq: Every day | ORAL | Status: DC

## 2012-02-12 MED ORDER — DILTIAZEM HCL ER COATED BEADS 240 MG PO CP24: 240.0000 mg | ORAL_CAPSULE | Freq: Every day | ORAL | Status: DC

## 2012-02-12 NOTE — Assessment & Plan Note (Signed)
Benign,  to f/u any worsening symptoms or concerns

## 2012-02-12 NOTE — Assessment & Plan Note (Signed)
stable overall by hx and exam, most recent data reviewed with pt, and pt to continue medical treatment as before Lab Results  Component Value Date   HGBA1C 5.8 01/05/2011

## 2012-02-12 NOTE — Progress Notes (Signed)
Subjective:    Patient ID: Penny Yates, female    DOB: March 16, 1956, 56 y.o.   MRN: [id number]  HPI  Here for wellness and f/u;  Overall doing ok;  Pt denies CP, worsening SOB, DOE, wheezing, orthopnea, PND, worsening LE edema, palpitations, dizziness or syncope except for stress and palpitations recent.  Pt denies neurological change such as new Headache, facial or extremity weakness.  Pt denies polydipsia, polyuria, or low sugar symptoms. Pt states overall good compliance with treatment and medications, good tolerability, and trying to follow lower cholesterol diet.  Pt denies worsening depressive symptoms, suicidal ideation or panic. No fever, wt loss, night sweats, loss of appetite, or other constitutional symptoms.  Pt states good ability with ADL's, low fall risk, home safety reviewed and adequate, no significant changes in hearing or vision, and occasionally active with exercise.  Has gained 10 lbs, but most recent sleep apnea testing neg, Does have sense of ongoing fatigue, but denies signficant hypersomnolence.  Due for labs today.  Back on lisinopril 2o mg on her own recetnly but BP remains elevated. Dry cough has returned with the lisniopril as well Past Medical History  Diagnosis Date  . Anemia   . History of colonic polyps     multiple of succeeding years 96, 97, 98, and 99  . Hyperlipidemia   . Hypertension   . Chronic venous insufficiency     LE's  . Low back pain   . Sciatica   . Leg length discrepancy     right leg longer  . Hyperthyroidism     s/p rad Iodine-ellison  . Sacroiliitis     history of  . Depression    Past Surgical History  Procedure Date  . Tubal ligation   . 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 Revision of scar tissue rectus muscle 11/08    right knee    reports that she has never smoked. She does not have any smokeless tobacco history on file. She reports that she does not drink alcohol or use illicit drugs. family history includes Cancer in  her mother; Colon cancer in her mother; Diabetes in her mother and unspecified family member; Heart disease in her maternal grandmother; Kidney disease in an unspecified family member; and Stroke in an unspecified family member.  There is no history of Thyroid disease. No Known Allergies Current Outpatient Prescriptions on File Prior to Visit  Medication Sig Dispense Refill  . aspirin 81 MG tablet Take 81 mg by mouth daily.        Marland Kitchen atorvastatin (LIPITOR) 10 MG tablet Take 1 tablet (10 mg total) by mouth daily.  90 tablet  3  . Biotin 5000 MCG CAPS Take by mouth.        . Calcium Carbonate-Vitamin D 300-100 MG-UNIT CAPS Take by mouth. Take one daily        . diltiazem (CARTIA XT) 240 MG 24 hr capsule Take 1 capsule (240 mg total) by mouth daily.  90 capsule  3  . fish oil-omega-3 fatty acids 1000 MG capsule Take 1 capsule by mouth daily.       Marland Kitchen guanFACINE (TENEX) 1 MG tablet Take 1 tablet (1 mg total) by mouth at bedtime.  90 tablet  3  . Multiple Vitamin (MULTIVITAMIN) capsule Take 1 capsule by mouth daily.        . potassium chloride SA (K-DUR,KLOR-CON) 20 MEQ tablet Take 1 tablet (20 mEq total) by mouth daily.  90 tablet  3  . traMADol (ULTRAM-ER) 200 MG 24 hr tablet TAKE 1 TABLET BY MOUTH DAILY AS NEEDED.  30 tablet  2  . Cholecalciferol (VITAMIN D3) 1000 UNITS CAPS Take 2,000 Units by mouth daily.        . furosemide (LASIX) 40 MG tablet Take 1 tablet (40 mg total) by mouth daily.  90 tablet  3  . lisinopril (PRINIVIL,ZESTRIL) 20 MG tablet Take 1 tablet (20 mg total) by mouth daily.  90 tablet  3  . venlafaxine (EFFEXOR XR) 37.5 MG 24 hr capsule Take 1 capsule (37.5 mg total) by mouth daily.  90 capsule  3   Review of Systems Review of Systems  Constitutional: Negative for diaphoresis, activity change, appetite change and unexpected weight change.  HENT: Negative for hearing loss, ear pain, facial swelling, mouth sores and neck stiffness.   Eyes: Negative for pain, redness and visual  disturbance.  Respiratory: Negative for shortness of breath and wheezing.   Cardiovascular: Negative for chest pain and palpitations.  Gastrointestinal: Negative for diarrhea, blood in stool, abdominal distention and rectal pain.  Genitourinary: Negative for hematuria, flank pain and decreased urine volume.  Musculoskeletal: Negative for myalgias and joint swelling.  Skin: Negative for color change and wound.  Neurological: Negative for syncope and numbness.  Hematological: Negative for adenopathy.  Psychiatric/Behavioral: Negative for hallucinations, self-injury, decreased concentration and agitation.      Objective:   Physical Exam BP 150/100  Pulse 70  Temp 97.7 F (36.5 C) (Oral)  Ht 5' 8.5" (1.74 m)  Wt 167 lb 8 oz (75.978 kg)  BMI 25.10 kg/m2  SpO2 95% Physical Exam  VS noted Constitutional: Pt is oriented to person, place, and time. Appears well-developed and well-nourished.  HENT:  Head: Normocephalic and atraumatic.  Right Ear: External ear normal.  Left Ear: External ear normal.  Nose: Nose normal.  Mouth/Throat: Oropharynx is clear and moist.  Eyes: Conjunctivae and EOM are normal. Pupils are equal, round, and reactive to light.  Neck: Normal range of motion. Neck supple. No JVD present. No tracheal deviation present.  Cardiovascular: Normal rate, regular rhythm, normal heart sounds and intact distal pulses.   Pulmonary/Chest: Effort normal and breath sounds normal.  Abdominal: Soft. Bowel sounds are normal. There is no tenderness.  Musculoskeletal: Normal range of motion. Exhibits no edema.  Lymphadenopathy:  Has no cervical adenopathy.  Neurological: Pt is alert and oriented to person, place, and time. Pt has normal reflexes. No cranial nerve deficit.  Skin: Skin is warm and dry. No rash noted.  Psychiatric:  Has  normal mood and affect. Behavior is normal. except 1+ nervous    Assessment & Plan:

## 2012-02-12 NOTE — Assessment & Plan Note (Signed)

## 2012-02-12 NOTE — Assessment & Plan Note (Signed)
Due for colnoscopy

## 2012-02-12 NOTE — Patient Instructions (Addendum)
You had the flu shot today OK to stop the lisinopril Please start the generic avapro 300 mg per day Continue all other medications as before Please have the pharmacy call with any refills you may need. Please go to LAB in the Basement for the blood and/or urine tests to be done today You will be contacted by phone if any changes need to be made immediately.  Otherwise, you will receive a letter about your results with an explanation. Please remember to sign up for My Chart at your earliest convenience, as this will be important to you in the future with finding out test results. You will be contacted regarding the referral for: colonoscopy with Dr Arlyce Dice Please return in 1 year for your yearly visit, or sooner if needed, with Lab testing done 3-5 days before

## 2012-02-14 ENCOUNTER — Ambulatory Visit: Payer: BC Managed Care – PPO | Admitting: *Deleted

## 2012-02-15 LAB — TSH: TSH: 5.02 u[IU]/mL (ref 0.35–5.50)

## 2012-03-06 ENCOUNTER — Other Ambulatory Visit: Payer: Self-pay | Admitting: Internal Medicine

## 2012-03-11 ENCOUNTER — Encounter: Payer: BC Managed Care – PPO | Attending: Internal Medicine | Admitting: *Deleted

## 2012-03-11 VITALS — Ht 68.5 in | Wt 168.9 lb

## 2012-03-11 DIAGNOSIS — IMO0001 Reserved for inherently not codable concepts without codable children: Secondary | ICD-10-CM | POA: Insufficient documentation

## 2012-03-11 DIAGNOSIS — Z713 Dietary counseling and surveillance: Secondary | ICD-10-CM | POA: Insufficient documentation

## 2012-03-11 NOTE — Patient Instructions (Signed)
Plan: Aim for 3 Carb Choices per meal +/- 1 either way Consider reading food labels for Total Carbohydrate of foods Consider gym and pool opportunities to incorporate activity in your daily life

## 2012-03-11 NOTE — Progress Notes (Signed)
  Medical Nutrition Therapy:  Appt start time: 1100 end time:  1200.  Assessment:  Primary concerns today: patient here for new diagnosis of diabetes. 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 Her work hours are 9-5 Monday thru Friday. She lives with her husband who is recently retired and he does the food shopping and prepares supper meal.    MEDICATIONS: see list   DIETARY INTAKE:  Usual eating pattern includes 2-3 meals and 2-3 snacks per day.  Everyday foods include fair variety of all food groups.  Avoided foods include none stated.    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   Nutritional Diagnosis:  NB-1.1 Food and nutrition-related knowledge deficit As related to new diagnosis of diabetes.  As evidenced by A1c of 6.9%.    Intervention:  Nutrition counseling and diabetes education initiated. Discussed basic physiology of diabetes, SMBG and rationale of checking BG at alternate times of day, A1c, Carb Counting and reading food labels, and benefits of increased activity.   Plan: Aim for 3 Carb Choices per meal +/- 1 either way Consider reading food labels for Total Carbohydrate of foods Consider gym and pool opportunities to incorporate activity in your daily life  Handouts given during visit include: Living Well with Diabetes Carb Counting and Food Label handouts Meal Plan  Card  Monitoring/Evaluation:  Dietary intake, exercise, reading food labels, and body weight prn.

## 2012-03-18 ENCOUNTER — Encounter: Payer: Self-pay | Admitting: *Deleted

## 2012-03-19 ENCOUNTER — Encounter: Payer: BC Managed Care – PPO | Admitting: Internal Medicine

## 2012-03-24 ENCOUNTER — Other Ambulatory Visit: Payer: Self-pay | Admitting: Internal Medicine

## 2012-04-18 ENCOUNTER — Other Ambulatory Visit: Payer: Self-pay | Admitting: Internal Medicine

## 2012-06-18 ENCOUNTER — Other Ambulatory Visit (HOSPITAL_COMMUNITY): Payer: Self-pay | Admitting: Orthopedic Surgery

## 2012-06-18 DIAGNOSIS — M25561 Pain in right knee: Secondary | ICD-10-CM

## 2012-06-19 ENCOUNTER — Encounter: Payer: Self-pay | Admitting: Internal Medicine

## 2012-06-19 ENCOUNTER — Other Ambulatory Visit: Payer: Self-pay | Admitting: Internal Medicine

## 2012-06-19 DIAGNOSIS — H9193 Unspecified hearing loss, bilateral: Secondary | ICD-10-CM

## 2012-06-30 ENCOUNTER — Encounter (HOSPITAL_COMMUNITY)
Admission: RE | Admit: 2012-06-30 | Discharge: 2012-06-30 | Disposition: A | Discharge status: Home / Self Care | Payer: BC Managed Care – PPO | Source: Ambulatory Visit | Attending: Orthopedic Surgery | Admitting: Orthopedic Surgery

## 2012-06-30 DIAGNOSIS — M25569 Pain in unspecified knee: Secondary | ICD-10-CM | POA: Insufficient documentation

## 2012-06-30 DIAGNOSIS — M25561 Pain in right knee: Secondary | ICD-10-CM

## 2012-06-30 MED ORDER — TECHNETIUM TC 99M MEDRONATE IV KIT
25.8000 | PACK | Freq: Once | INTRAVENOUS | Status: AC | PRN
  Administered 2012-06-30: 25.8 via INTRAVENOUS

## 2012-08-20 ENCOUNTER — Other Ambulatory Visit: Payer: Self-pay | Admitting: Internal Medicine

## 2012-08-25 DIAGNOSIS — Z96659 Presence of unspecified artificial knee joint: Secondary | ICD-10-CM | POA: Insufficient documentation

## 2012-09-01 ENCOUNTER — Encounter: Payer: Self-pay | Admitting: Internal Medicine

## 2012-09-01 ENCOUNTER — Encounter: Payer: Self-pay | Admitting: Gastroenterology

## 2012-09-01 DIAGNOSIS — J3489 Other specified disorders of nose and nasal sinuses: Secondary | ICD-10-CM

## 2012-09-01 NOTE — Telephone Encounter (Signed)
Ok for referral  Penny Yates to remove colonscopy as per pt

## 2012-10-03 ENCOUNTER — Ambulatory Visit (AMBULATORY_SURGERY_CENTER): Payer: BC Managed Care – PPO | Admitting: *Deleted

## 2012-10-03 VITALS — Ht 68.0 in | Wt 179.6 lb

## 2012-10-03 DIAGNOSIS — Z1211 Encounter for screening for malignant neoplasm of colon: Secondary | ICD-10-CM

## 2012-10-03 MED ORDER — NA SULFATE-K SULFATE-MG SULF 17.5-3.13-1.6 GM/177ML PO SOLN: ORAL | Status: DC

## 2012-10-06 ENCOUNTER — Telehealth: Payer: Self-pay | Admitting: *Deleted

## 2012-10-06 ENCOUNTER — Encounter: Payer: Self-pay | Admitting: Gastroenterology

## 2012-10-06 NOTE — Telephone Encounter (Signed)
Pt scheduled for colonoscopy 6/26 with Dr. Arlyce Dice.  Last colonoscopy 2008 with Dr. Betha Loa in [place], [place].  Pt said she had polyps.  Mother had colon cancer at age 59.  Release of information form signed and given to Merri Ray, CMA.  NOTE: There is a colonoscopy report for Tito Dine in this chart that does not belong to this patient.  Ticket started to have report removed from pt's chart.

## 2012-10-07 ENCOUNTER — Encounter: Payer: Self-pay | Admitting: Internal Medicine

## 2012-10-07 NOTE — Telephone Encounter (Signed)
RECORDS RELEASE FAXED

## 2012-10-09 ENCOUNTER — Encounter: Payer: Self-pay | Admitting: Internal Medicine

## 2012-10-09 DIAGNOSIS — I890 Lymphedema, not elsewhere classified: Secondary | ICD-10-CM

## 2012-10-14 LAB — HM MAMMOGRAPHY

## 2012-10-14 NOTE — Telephone Encounter (Signed)
Colonoscopy report for Penny Yates has been removed from chart

## 2012-10-16 ENCOUNTER — Ambulatory Visit (AMBULATORY_SURGERY_CENTER): Payer: BC Managed Care – PPO | Admitting: Gastroenterology

## 2012-10-16 ENCOUNTER — Encounter: Payer: Self-pay | Admitting: Gastroenterology

## 2012-10-16 ENCOUNTER — Other Ambulatory Visit: Payer: Self-pay | Admitting: Internal Medicine

## 2012-10-16 VITALS — BP 160/108 | HR 74 | Temp 97.0°F | Resp 13 | Ht 68.0 in | Wt 179.0 lb

## 2012-10-16 DIAGNOSIS — Z800 Family history of malignant neoplasm of digestive organs: Secondary | ICD-10-CM

## 2012-10-16 DIAGNOSIS — Z1211 Encounter for screening for malignant neoplasm of colon: Secondary | ICD-10-CM

## 2012-10-16 MED ORDER — SODIUM CHLORIDE 0.9 % IV SOLN: 500.0000 mL | INTRAVENOUS | Status: DC

## 2012-10-16 NOTE — Op Note (Addendum)
[facility] [address] [place], [address]   COLONOSCOPY PROCEDURE REPORT  PATIENT: Penny Yates, Penny Yates  MR#: [id number] BIRTHDATE: [birth year] , 56  yrs. old GENDER: Female ENDOSCOPIST: Louis Meckel, MD REFERRED BY: PROCEDURE DATE:  10/16/2012 PROCEDURE:   Colonoscopy, diagnostic ASA CLASS:   Class II INDICATIONS:Patient's personal history of colon polyps and Patient's immediate family history of colon cancer. 2008 colonoscopy negative for polyps MEDICATIONS: MAC sedation, administered by CRNA and propofol (Diprivan) 200mg  IV  DESCRIPTION OF PROCEDURE:   After the risks benefits and alternatives of the procedure were thoroughly explained, informed consent was obtained.  A digital rectal exam revealed no abnormalities of the rectum.   The LB [id number]  endoscope was introduced through the anus and advanced to the cecum, which was identified by both the appendix and ileocecal valve. No adverse events experienced.   The quality of the prep was excellent using Suprep  The instrument was then slowly withdrawn as the colon was fully examined.      COLON FINDINGS: Mild diverticulosis was noted in the sigmoid colon. The colon mucosa was otherwise normal.  Retroflexed views revealed no abnormalities. The time to cecum=3 minutes 51 seconds. Withdrawal time=8 minutes 27 seconds.  The scope was withdrawn and the procedure completed. COMPLICATIONS: There were no complications.  ENDOSCOPIC IMPRESSION: 1.   Mild diverticulosis was noted in the sigmoid colon 2.   The colon mucosa was otherwise normal  RECOMMENDATIONS: 1.  continue colonoscopy every 5 years in view of family history of colon cancer   eSigned:  Louis Meckel, MD 10/16/2012 12:22 PM Revised: 10/16/2012 12:22 PM  cc: Corwin Levins, MD

## 2012-10-16 NOTE — Progress Notes (Signed)
Report given to Karl Bales, RN to finish with the pt. Maw

## 2012-10-16 NOTE — Progress Notes (Signed)
Patient did not experience any of the following events: a burn prior to discharge; a fall within the facility; wrong site/side/patient/procedure/implant event; or a hospital transfer or hospital admission upon discharge from the facility. (G8907) Patient did not have preoperative order for IV antibiotic SSI prophylaxis. (G8918)  

## 2012-10-16 NOTE — Progress Notes (Signed)
No complaints noted in the recovery room. Maw  Pt's blood pressure escalated in the recovery room back in range where it was pre-procedure.  Pt was made aware of it.  She knew it had been elevated.  Maw  Patient did not experience any of the following events: a burn prior to discharge; a fall within the facility; wrong site/side/patient/procedure/implant event; or a hospital transfer or hospital admission upon discharge from the facility. (G8907)Patient did not have preoperative order for IV antibiotic SSI prophylaxis. [phone])

## 2012-10-16 NOTE — Patient Instructions (Addendum)
YOU HAD AN ENDOSCOPIC PROCEDURE TODAY AT THE [facility]: Refer to the procedure report that was given to you for any specific questions about what was found during the examination.  If the procedure report does not answer your questions, please call your gastroenterologist to clarify.  If you requested that your care partner not be given the details of your procedure findings, then the procedure report has been included in a sealed envelope for you to review at your convenience later.  YOU SHOULD EXPECT: Some feelings of bloating in the abdomen. Passage of more gas than usual.  Walking can help get rid of the air that was put into your GI tract during the procedure and reduce the bloating. If you had a lower endoscopy (such as a colonoscopy or flexible sigmoidoscopy) you may notice spotting of blood in your stool or on the toilet paper. If you underwent a bowel prep for your procedure, then you may not have a normal bowel movement for a few days.  DIET: Your first meal following the procedure should be a light meal and then it is ok to progress to your normal diet.  A half-sandwich or bowl of soup is an example of a good first meal.  Heavy or fried foods are harder to digest and may make you feel nauseous or bloated.  Likewise meals heavy in dairy and vegetables can cause extra gas to form and this can also increase the bloating.  Drink plenty of fluids but you should avoid alcoholic beverages for 24 hours.  ACTIVITY: Your care partner should take you home directly after the procedure.  You should plan to take it easy, moving slowly for the rest of the day.  You can resume normal activity the day after the procedure however you should NOT DRIVE or use heavy machinery for 24 hours (because of the sedation medicines used during the test).    SYMPTOMS TO REPORT IMMEDIATELY: A gastroenterologist can be reached at any hour.  During normal business hours, 8:30 AM to 5:00 PM Monday through Friday,  call [phone].  After hours and on weekends, please call the GI answering service at [phone] who will take a message and have the physician on call contact you.   Following lower endoscopy (colonoscopy or flexible sigmoidoscopy):  Excessive amounts of blood in the stool  Significant tenderness or worsening of abdominal pains  Swelling of the abdomen that is new, acute  Fever of 100F or higher    FOLLOW UP: If any biopsies were taken you will be contacted by phone or by letter within the next 1-3 weeks.  Call your gastroenterologist if you have not heard about the biopsies in 3 weeks.  Our staff will call the home number listed on your records the next business day following your procedure to check on you and address any questions or concerns that you may have at that time regarding the information given to you following your procedure. This is a courtesy call and so if there is no answer at the home number and we have not heard from you through the emergency physician on call, we will assume that you have returned to your regular daily activities without incident.  SIGNATURES/CONFIDENTIALITY: You and/or your care partner have signed paperwork which will be entered into your electronic medical record.  These signatures attest to the fact that that the information above on your After Visit Summary has been reviewed and is understood.  Full responsibility of the confidentiality   of this discharge information lies with you and/or your care-partner.    Handouts were given to your care partner on diverticulosis and a high fiber diet. Resume your current medications today. Please call if any questions or concerns.     

## 2012-10-17 ENCOUNTER — Telehealth: Payer: Self-pay | Admitting: *Deleted

## 2012-10-17 NOTE — Telephone Encounter (Signed)
  Follow up Call-  Call back number 10/16/2012  Post procedure Call Back phone  # [phone] hm  Permission to leave phone message Yes     Patient questions:  Do you have a fever, pain , or abdominal swelling? no Pain Score  0 *  Have you tolerated food without any problems? yes  Have you been able to return to your normal activities? yes  Do you have any questions about your discharge instructions: Diet   no Medications  no Follow up visit  no  Do you have questions or concerns about your Care? no  Actions: * If pain score is 4 or above: No action needed, pain <4.

## 2012-10-21 ENCOUNTER — Ambulatory Visit (INDEPENDENT_AMBULATORY_CARE_PROVIDER_SITE_OTHER): Payer: BC Managed Care – PPO | Admitting: Internal Medicine

## 2012-10-21 ENCOUNTER — Encounter: Payer: Self-pay | Admitting: Internal Medicine

## 2012-10-21 ENCOUNTER — Other Ambulatory Visit (INDEPENDENT_AMBULATORY_CARE_PROVIDER_SITE_OTHER): Payer: BC Managed Care – PPO

## 2012-10-21 VITALS — BP 132/90 | HR 78 | Temp 97.6°F | Ht 68.5 in | Wt 178.0 lb

## 2012-10-21 DIAGNOSIS — I10 Essential (primary) hypertension: Secondary | ICD-10-CM

## 2012-10-21 DIAGNOSIS — E0590 Thyrotoxicosis, unspecified without thyrotoxic crisis or storm: Secondary | ICD-10-CM

## 2012-10-21 DIAGNOSIS — Z0000 Encounter for general adult medical examination without abnormal findings: Secondary | ICD-10-CM

## 2012-10-21 LAB — BASIC METABOLIC PANEL
CO2: 34 mEq/L — ABNORMAL HIGH (ref 19–32)
Chloride: 101 mEq/L (ref 96–112)
GFR: 71.33 mL/min (ref >60.00)
Glucose, Bld: 64 mg/dL — ABNORMAL LOW (ref 70–99)
Potassium: 3.7 mEq/L (ref 3.5–5.1)
Sodium: 139 mEq/L (ref 135–145)

## 2012-10-21 LAB — HEPATIC FUNCTION PANEL
ALT: 22 U/L (ref 0–35)
AST: 20 U/L (ref 0–37)
Alkaline Phosphatase: 129 U/L — ABNORMAL HIGH (ref 39–117)
Bilirubin, Direct: 0.1 mg/dL (ref 0.0–0.3)
Total Protein: 7.6 g/dL (ref 6.0–8.3)

## 2012-10-21 LAB — HEMOGLOBIN A1C: Hgb A1c MFr Bld: 6.1 % (ref 4.6–6.5)

## 2012-10-21 MED ORDER — DILTIAZEM HCL ER COATED BEADS 360 MG PO CP24: 360.0000 mg | ORAL_CAPSULE | Freq: Every day | ORAL | Status: DC

## 2012-10-21 NOTE — Progress Notes (Signed)
Subjective:    Patient ID: Penny Yates, female    DOB: 1955-09-21, 57 y.o.   MRN: [id number]  HPI Here after unfortunately developed a dry cough soon after starting lisinopril, then resolved within 1 wk after stopped the ACE on her own.  Pt denies chest pain, increased sob or doe, wheezing, orthopnea, PND, increased LE swelling, palpitations, dizziness or syncope. Pt denies new neurological symptoms such as new headache, or facial or extremity weakness or numbness   Pt denies polydipsia, polyuria.  Did have markedly elev BP last wk with colonoscopy.  Pt denies polydipsia, polyuria, or low sugar symptoms such as weakness or confusion improved with po intake.  Pt states overall good compliance with meds, trying to follow lower cholesterol, diabetic diet, wt overall stable but little exercise however.  Denies hyper or hypo thyroid symptoms such as voice, skin or hair change. Past Medical History  Diagnosis Date  . Anemia   . History of colonic polyps     multiple of succeeding years 96, 97, 98, and 99  . Hyperlipidemia   . Hypertension   . Chronic venous insufficiency     LE's  . Low back pain   . Sciatica   . Leg length discrepancy     right leg longer  . Hyperthyroidism     s/p rad Iodine-ellison  . Sacroiliitis     history of  . Depression   . Diabetes mellitus without complication   . Lymphedema     left arm, both legs; primary lymphedema   Past Surgical History  Procedure Laterality Date  . Tubal ligation    . 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 She has never used smokeless tobacco. She reports that she does not drink alcohol or use illicit drugs. family history includes Cancer in her mother; Colon cancer (age of onset: 7) in her mother; Diabetes in her mother and unspecified family member; Heart disease in her maternal grandmother; Kidney disease in an unspecified  family member; and Stroke in an unspecified family member.  There is no history of Thyroid disease, and Esophageal cancer, and Rectal cancer, and Stomach cancer, . Allergies  Allergen Reactions  . Ace Inhibitors Cough  . Lisinopril     Cough   Current Outpatient Prescriptions on File Prior to Visit  Medication Sig Dispense Refill  . aspirin 81 MG tablet Take 81 mg by mouth daily.        Marland Kitchen atorvastatin (LIPITOR) 10 MG tablet Take 1 tablet (10 mg total) by mouth daily.  90 tablet  3  . Biotin 5000 MCG CAPS Take by mouth.        . Calcium Carbonate-Vitamin D 300-100 MG-UNIT CAPS Take by mouth. Take one daily        . Cholecalciferol (VITAMIN D3) 1000 UNITS CAPS Take 2,000 Units by mouth daily.        Marland Kitchen diltiazem (CARTIA XT) 240 MG 24 hr capsule Take 1 capsule (240 mg total) by mouth daily.  90 capsule  3  . fish oil-omega-3 fatty acids 1000 MG capsule Take 1 capsule by mouth daily.       . furosemide (LASIX) 40 MG tablet TAKE 1 TABLET (40 MG TOTAL) BY MOUTH DAILY.  90 tablet  3  . guanFACINE (TENEX) 1 MG tablet Take 1 tablet (1 mg total) by  mouth at bedtime.  90 tablet  3  . irbesartan (AVAPRO) 300 MG tablet Take 1 tablet (300 mg total) by mouth daily.  90 tablet  3  . KLOR-CON M20 20 MEQ tablet TAKE 1 TABLET (20 MEQ TOTAL) BY MOUTH DAILY.  90 tablet  2  . LISINOPRIL PO Take by mouth daily.      . Multiple Vitamin (MULTIVITAMIN) capsule Take 1 capsule by mouth daily.        . nabumetone (RELAFEN) 500 MG tablet       . traMADol (ULTRAM-ER) 200 MG 24 hr tablet TAKE 1 TABLET (200 MG TOTAL) BY MOUTH DAILY AS NEEDED FOR PAIN.  30 tablet  2  . venlafaxine XR (EFFEXOR-XR) 37.5 MG 24 hr capsule TAKE ONE CAPSULE BY MOUTH EVERY DAY  90 capsule  2   No current facility-administered medications on file prior to visit.   Review of Systems  Constitutional: Negative for unexpected weight change, or unusual diaphoresis  HENT: Negative for tinnitus.   Eyes: Negative for photophobia and visual  disturbance.  Respiratory: Negative for choking and stridor.   Gastrointestinal: Negative for vomiting and blood in stool.  Genitourinary: Negative for hematuria and decreased urine volume.  Musculoskeletal: Negative for acute joint swelling Skin: Negative for color change and wound.  Neurological: Negative for tremors and numbness other than noted  Psychiatric/Behavioral: Negative for decreased concentration or  hyperactivity.       Objective:   Physical Exam BP 132/90  Pulse 78  Temp(Src) 97.6 F (36.4 C) (Oral)  Ht 5' 8.5" (1.74 m)  Wt 178 lb (80.74 kg)  BMI 26.67 kg/m2  SpO2 99% VS noted,  Constitutional: Pt appears well-developed and well-nourished.  HENT: Head: NCAT.  Right Ear: External ear normal.  Left Ear: External ear normal.  Eyes: Conjunctivae and EOM are normal. Pupils are equal, round, and reactive to light.  Neck: Normal range of motion. Neck supple.  Cardiovascular: Normal rate and regular rhythm.   Pulmonary/Chest: Effort normal and breath sounds normal.  Abd:  Soft, NT, non-distended, + BS Neurological: Pt is alert. Not confused  Skin: Skin is warm. No erythema.  Psychiatric: Pt behavior is normal. Thought content normal.     Assessment & Plan:

## 2012-10-21 NOTE — Assessment & Plan Note (Signed)
Ok for increased dilt Er to 360 mg, cont all other meds, to f/u any worsening symptoms or concerns

## 2012-10-21 NOTE — Assessment & Plan Note (Signed)
stable overall by history and exam, recent data reviewed with pt, and pt to continue medical treatment as before,  to f/u any worsening symptoms or concerns Lab Results  Component Value Date   HGBA1C 5.8 01/05/2011   For f/u lab

## 2012-10-21 NOTE — Assessment & Plan Note (Signed)
Pt reqeusts f/u tsh, asympt,  Lab Results  Component Value Date   TSH 5.02 02/12/2012  \

## 2012-10-21 NOTE — Patient Instructions (Signed)
OK to stop the diltiazem ER 240 mg per day Please take all new medication as prescribed- the Diltiazem ER 360 mg per day Please continue all other medications as before, and refills have been done if requested. Please have the pharmacy call with any other refills you may need. Please go to the LAB in the Basement (turn left off the elevator) for the tests to be done today You will be contacted by phone if any changes need to be made immediately.  Otherwise, you will receive a letter about your results with an explanation, but please check with MyChart first.  Please remember to sign up for My Chart if you have not done so, as this will be important to you in the future with finding out test results, communicating by private email, and scheduling acute appointments online when needed.  Please return in 6 months, or sooner if needed, with Lab testing done 3-5 days before

## 2012-11-12 ENCOUNTER — Other Ambulatory Visit: Payer: Self-pay | Admitting: Internal Medicine

## 2012-11-19 ENCOUNTER — Encounter: Payer: Self-pay | Admitting: Internal Medicine

## 2012-12-19 ENCOUNTER — Telehealth: Payer: Self-pay | Admitting: Internal Medicine

## 2012-12-19 MED ORDER — TRAMADOL HCL ER 200 MG PO TB24: ORAL_TABLET | ORAL | Status: DC

## 2012-12-19 NOTE — Telephone Encounter (Signed)
Sorry, done now.

## 2012-12-19 NOTE — Telephone Encounter (Signed)
Have not received hardcopy 

## 2012-12-19 NOTE — Telephone Encounter (Signed)
Done hardcopy to robin  

## 2012-12-19 NOTE — Telephone Encounter (Signed)
Faxed hardcopy to [facility] [place] 

## 2013-01-05 ENCOUNTER — Encounter (HOSPITAL_BASED_OUTPATIENT_CLINIC_OR_DEPARTMENT_OTHER): Payer: Self-pay | Admitting: *Deleted

## 2013-01-05 ENCOUNTER — Ambulatory Visit: Payer: BC Managed Care – PPO

## 2013-01-05 NOTE — Progress Notes (Signed)
Will need istat-mild sleep apnea-did not need cpap

## 2013-01-06 ENCOUNTER — Ambulatory Visit: Payer: BC Managed Care – PPO

## 2013-01-06 ENCOUNTER — Ambulatory Visit (HOSPITAL_BASED_OUTPATIENT_CLINIC_OR_DEPARTMENT_OTHER)
Admission: RE | Admit: 2013-01-06 | Discharge: 2013-01-06 | Disposition: A | Discharge status: Home / Self Care | Payer: BC Managed Care – PPO | Source: Ambulatory Visit | Attending: Orthopedic Surgery | Admitting: Orthopedic Surgery

## 2013-01-06 ENCOUNTER — Encounter (HOSPITAL_BASED_OUTPATIENT_CLINIC_OR_DEPARTMENT_OTHER): Payer: Self-pay

## 2013-01-06 ENCOUNTER — Encounter (HOSPITAL_BASED_OUTPATIENT_CLINIC_OR_DEPARTMENT_OTHER): Payer: Self-pay | Admitting: Anesthesiology

## 2013-01-06 ENCOUNTER — Ambulatory Visit (HOSPITAL_BASED_OUTPATIENT_CLINIC_OR_DEPARTMENT_OTHER): Payer: BC Managed Care – PPO | Admitting: Anesthesiology

## 2013-01-06 ENCOUNTER — Encounter (HOSPITAL_BASED_OUTPATIENT_CLINIC_OR_DEPARTMENT_OTHER)
Admission: RE | Disposition: A | Discharge status: Home / Self Care | Payer: Self-pay | Source: Ambulatory Visit | Attending: Orthopedic Surgery

## 2013-01-06 DIAGNOSIS — S52309A Unspecified fracture of shaft of unspecified radius, initial encounter for closed fracture: Secondary | ICD-10-CM | POA: Insufficient documentation

## 2013-01-06 DIAGNOSIS — I10 Essential (primary) hypertension: Secondary | ICD-10-CM | POA: Insufficient documentation

## 2013-01-06 DIAGNOSIS — S52301A Unspecified fracture of shaft of right radius, initial encounter for closed fracture: Secondary | ICD-10-CM

## 2013-01-06 DIAGNOSIS — E119 Type 2 diabetes mellitus without complications: Secondary | ICD-10-CM | POA: Insufficient documentation

## 2013-01-06 HISTORY — PX: OPEN REDUCTION INTERNAL FIXATION (ORIF) DISTAL RADIAL FRACTURE: SHX5989

## 2013-01-06 HISTORY — DX: Unspecified fracture of shaft of right radius, initial encounter for closed fracture: S52.301A

## 2013-01-06 LAB — POCT I-STAT, CHEM 8
Creatinine, Ser: 1.1 mg/dL (ref 0.50–1.10)
Glucose, Bld: 108 mg/dL — ABNORMAL HIGH (ref 70–99)
Hemoglobin: 13.6 g/dL (ref 12.0–15.0)
Sodium: 141 mEq/L (ref 135–145)
TCO2: 23 mmol/L (ref 0–100)

## 2013-01-06 SURGERY — OPEN REDUCTION INTERNAL FIXATION (ORIF) DISTAL RADIUS FRACTURE
Anesthesia: Regional | Site: Arm Lower | Laterality: Right | Wound class: Clean

## 2013-01-06 MED ORDER — PROPOFOL 10 MG/ML IV BOLUS
INTRAVENOUS | Status: DC | PRN
  Administered 2013-01-06: 100 mg via INTRAVENOUS

## 2013-01-06 MED ORDER — LACTATED RINGERS IV SOLN
INTRAVENOUS | Status: DC
  Administered 2013-01-06 (×2): via INTRAVENOUS

## 2013-01-06 MED ORDER — OXYCODONE-ACETAMINOPHEN 10-325 MG PO TABS: 1.0000 | ORAL_TABLET | Freq: Four times a day (QID) | ORAL | Status: DC | PRN

## 2013-01-06 MED ORDER — HYDROMORPHONE HCL PF 1 MG/ML IJ SOLN: 0.2500 mg | INTRAMUSCULAR | Status: DC | PRN

## 2013-01-06 MED ORDER — DEXAMETHASONE SODIUM PHOSPHATE 10 MG/ML IJ SOLN
INTRAMUSCULAR | Status: DC | PRN
  Administered 2013-01-06: 10 mg via INTRAVENOUS

## 2013-01-06 MED ORDER — FENTANYL CITRATE 0.05 MG/ML IJ SOLN
50.0000 ug | INTRAMUSCULAR | Status: DC | PRN
  Administered 2013-01-06: 100 ug via INTRAVENOUS

## 2013-01-06 MED ORDER — CEFAZOLIN SODIUM-DEXTROSE 2-3 GM-% IV SOLR
INTRAVENOUS | Status: DC | PRN
  Administered 2013-01-06: 2 g via INTRAVENOUS

## 2013-01-06 MED ORDER — ONDANSETRON HCL 4 MG/2ML IJ SOLN
INTRAMUSCULAR | Status: DC | PRN
  Administered 2013-01-06: 4 mg via INTRAVENOUS

## 2013-01-06 MED ORDER — BUPIVACAINE-EPINEPHRINE PF 0.5-1:200000 % IJ SOLN
INTRAMUSCULAR | Status: DC | PRN
  Administered 2013-01-06: 30 mL

## 2013-01-06 MED ORDER — 0.9 % SODIUM CHLORIDE (POUR BTL) OPTIME
TOPICAL | Status: DC | PRN
  Administered 2013-01-06: 1000 mL

## 2013-01-06 MED ORDER — OXYCODONE HCL 5 MG/5ML PO SOLN: 5.0000 mg | Freq: Once | ORAL | Status: DC | PRN

## 2013-01-06 MED ORDER — MIDAZOLAM HCL 2 MG/2ML IJ SOLN
1.0000 mg | INTRAMUSCULAR | Status: DC | PRN
  Administered 2013-01-06: 1 mg via INTRAVENOUS

## 2013-01-06 MED ORDER — METHOCARBAMOL 500 MG PO TABS: 500.0000 mg | ORAL_TABLET | Freq: Four times a day (QID) | ORAL | Status: DC

## 2013-01-06 MED ORDER — BUPIVACAINE HCL (PF) 0.5 % IJ SOLN
INTRAMUSCULAR | Status: DC | PRN
  Administered 2013-01-06: 8 mL

## 2013-01-06 MED ORDER — LIDOCAINE HCL (CARDIAC) 20 MG/ML IV SOLN
INTRAVENOUS | Status: DC | PRN
  Administered 2013-01-06: 50 mg via INTRAVENOUS

## 2013-01-06 MED ORDER — SENNA-DOCUSATE SODIUM 8.6-50 MG PO TABS: 2.0000 | ORAL_TABLET | Freq: Every day | ORAL | Status: DC

## 2013-01-06 MED ORDER — OXYCODONE HCL 5 MG PO TABS: 5.0000 mg | ORAL_TABLET | Freq: Once | ORAL | Status: DC | PRN

## 2013-01-06 SURGICAL SUPPLY — 67 items
BANDAGE ELASTIC 3 VELCRO ST LF (GAUZE/BANDAGES/DRESSINGS) ×2 IMPLANT
BANDAGE ELASTIC 4 VELCRO ST LF (GAUZE/BANDAGES/DRESSINGS) ×2 IMPLANT
BENZOIN TINCTURE PRP APPL 2/3 (GAUZE/BANDAGES/DRESSINGS) ×2 IMPLANT
BIT DRILL 2.5X110 QC LCP DISP (BIT) ×2 IMPLANT
BLADE MINI RND TIP GREEN BEAV (BLADE) IMPLANT
BLADE SURG 15 STRL LF DISP TIS (BLADE) ×1 IMPLANT
BLADE SURG 15 STRL SS (BLADE) ×1
BNDG COHESIVE 4X5 TAN STRL (GAUZE/BANDAGES/DRESSINGS) IMPLANT
BNDG ESMARK 4X9 LF (GAUZE/BANDAGES/DRESSINGS) ×2 IMPLANT
CLOTH BEACON ORANGE TIMEOUT ST (SAFETY) ×2 IMPLANT
CORDS BIPOLAR (ELECTRODE) ×2 IMPLANT
COVER TABLE BACK 60X90 (DRAPES) ×2 IMPLANT
CUFF TOURNIQUET SINGLE 18IN (TOURNIQUET CUFF) ×2 IMPLANT
DECANTER SPIKE VIAL GLASS SM (MISCELLANEOUS) ×2 IMPLANT
DRAPE EXTREMITY T 121X128X90 (DRAPE) ×2 IMPLANT
DRAPE INCISE IOBAN 66X45 STRL (DRAPES) ×2 IMPLANT
DRAPE MICROSCOPE WILD 40.5X102 (DRAPES) IMPLANT
DRAPE OEC MINIVIEW 54X84 (DRAPES) ×2 IMPLANT
DRAPE SURG 17X23 STRL (DRAPES) ×2 IMPLANT
DRAPE U 20/CS (DRAPES) ×2 IMPLANT
DURAPREP 26ML APPLICATOR (WOUND CARE) ×2 IMPLANT
ELECT REM PT RETURN 9FT ADLT (ELECTROSURGICAL) ×2
ELECTRODE REM PT RTRN 9FT ADLT (ELECTROSURGICAL) ×1 IMPLANT
GLOVE BIO SURGEON STRL SZ7 (GLOVE) ×4 IMPLANT
GLOVE BIOGEL PI IND STRL 8 (GLOVE) ×2 IMPLANT
GLOVE BIOGEL PI INDICATOR 8 (GLOVE) ×2
GLOVE ORTHO TXT STRL SZ7.5 (GLOVE) ×2 IMPLANT
GLOVE SURG ORTHO 8.0 STRL STRW (GLOVE) ×2 IMPLANT
GLOVE SURG SS PI 7.0 STRL IVOR (GLOVE) ×4 IMPLANT
GOWN BRE IMP PREV XXLGXLNG (GOWN DISPOSABLE) ×6 IMPLANT
GOWN PREVENTION PLUS XLARGE (GOWN DISPOSABLE) ×4 IMPLANT
K-WIRE 1.6X150 (WIRE) ×2
KWIRE 1.6X150 (WIRE) ×1 IMPLANT
NEEDLE HYPO 25X1 1.5 SAFETY (NEEDLE) IMPLANT
NS IRRIG 1000ML POUR BTL (IV SOLUTION) ×2 IMPLANT
PACK BASIN DAY SURGERY FS (CUSTOM PROCEDURE TRAY) ×2 IMPLANT
PAD CAST 3X4 CTTN HI CHSV (CAST SUPPLIES) ×1 IMPLANT
PAD CAST 4YDX4 CTTN HI CHSV (CAST SUPPLIES) ×1 IMPLANT
PADDING CAST ABS 4INX4YD NS (CAST SUPPLIES) ×1
PADDING CAST ABS COTTON 4X4 ST (CAST SUPPLIES) ×1 IMPLANT
PADDING CAST COTTON 3X4 STRL (CAST SUPPLIES) ×1
PADDING CAST COTTON 4X4 STRL (CAST SUPPLIES) ×1
PENCIL BUTTON HOLSTER BLD 10FT (ELECTRODE) ×2 IMPLANT
PLATE LC DCP 6H 77MM (Plate) ×2 IMPLANT
PLATE LC DCP 7H 90MM (Plate) ×2 IMPLANT
SCREW CORTEX 3.5 14MM (Screw) ×5 IMPLANT
SCREW CORTEX 3.5 16MM (Screw) ×1 IMPLANT
SCREW LOCK CORT ST 3.5X14 (Screw) ×5 IMPLANT
SCREW LOCK CORT ST 3.5X16 (Screw) ×1 IMPLANT
SLEEVE SCD COMPRESS KNEE MED (MISCELLANEOUS) ×2 IMPLANT
SPLINT FAST PLASTER 5X30 (CAST SUPPLIES) ×10
SPLINT PLASTER CAST FAST 5X30 (CAST SUPPLIES) ×10 IMPLANT
SPONGE GAUZE 4X4 12PLY (GAUZE/BANDAGES/DRESSINGS) ×2 IMPLANT
STOCKINETTE 4X48 STRL (DRAPES) ×2 IMPLANT
STRIP CLOSURE SKIN 1/2X4 (GAUZE/BANDAGES/DRESSINGS) ×2 IMPLANT
SUCTION FRAZIER TIP 10 FR DISP (SUCTIONS) ×2 IMPLANT
SUT ETHILON 3 0 PS 1 (SUTURE) IMPLANT
SUT ETHILON 4 0 PS 2 18 (SUTURE) IMPLANT
SUT MNCRL AB 4-0 PS2 18 (SUTURE) ×2 IMPLANT
SUT VIC AB 0 CT1 27 (SUTURE)
SUT VIC AB 0 CT1 27XBRD ANBCTR (SUTURE) IMPLANT
SUT VICRYL 3-0 CR8 SH (SUTURE) ×2 IMPLANT
SYR BULB 3OZ (MISCELLANEOUS) ×2 IMPLANT
SYR CONTROL 10ML LL (SYRINGE) IMPLANT
TOWEL OR 17X24 6PK STRL BLUE (TOWEL DISPOSABLE) ×2 IMPLANT
TUBE CONNECTING 20X1/4 (TUBING) ×2 IMPLANT
UNDERPAD 30X30 INCONTINENT (UNDERPADS AND DIAPERS) ×2 IMPLANT

## 2013-01-06 NOTE — Anesthesia Postprocedure Evaluation (Signed)
  Anesthesia Post-op Note  Patient: Penny Yates  Procedure(s) Performed: Procedure(s): RIGHT OPEN REDUCTION INTERNAL FIXATION (ORIF) DISTAL ARTICULATING RADIAL FRACTURE  (Right)  Patient Location: PACU  Anesthesia Type:GA combined with regional for post-op pain  Level of Consciousness: awake and alert   Airway and Oxygen Therapy: Patient Spontanous Breathing  Post-op Pain: none  Post-op Assessment: Post-op Vital signs reviewed, Patient's Cardiovascular Status Stable, Respiratory Function Stable, Patent Airway and No signs of Nausea or vomiting  Post-op Vital Signs: Reviewed and stable  Complications: No apparent anesthesia complications

## 2013-01-06 NOTE — Op Note (Signed)
01/06/2013  1:26 PM  PATIENT:  Penny Yates    PRE-OPERATIVE DIAGNOSIS:  RIGHT DISTAL RADIUS SHAFT FRACTURE  POST-OPERATIVE DIAGNOSIS:  Same  PROCEDURE:  RIGHT OPEN REDUCTION INTERNAL FIXATION (ORIF) radial shaft fracture  SURGEON:  Eulas Post, MD  PHYSICIAN ASSISTANT: Janace Litten, OPA-C, present and scrubbed throughout the case, critical for completion in a timely fashion, and for retraction, instrumentation, and closure.  ANESTHESIA:   General  PREOPERATIVE INDICATIONS:  Penny Yates is a  57 y.o. female with a diagnosis of RIGHT RADIUS FRACTURE who elected for surgical management to minimize the risk for posttraumatic arthritis, malunion, loss of rotational function, among others.    The risks benefits and alternatives were discussed with the patient including but not limited to the risks of nonoperative treatment, versus surgical intervention including infection, bleeding, nerve injury, malunion, nonunion, the need for revision surgery, hardware prominence, hardware failure, the need for hardware removal, blood clots, cardiopulmonary complications, morbidity, mortality, among others, and they were willing to proceed.     OPERATIVE IMPLANTS: Synthes small fragment compression plate with 3 distal cortical screws and 3 proximal cortical screws  OPERATIVE FINDINGS: Fairly transverse midshaft radius fracture. The DRUJ was stable after all implants were in. This was stressed under live fluoroscopy.  OPERATIVE PROCEDURE: The patient was brought to the operating room and placed in the supine position. General anesthesia was administered. IV antibiotics were given. The right upper extremity was prepped and draped in usual sterile fashion. Time out was performed. The arm was elevated and exsanguinated and the tourniquet was inflated. Volar approach to the radial shaft was performed. I utilized the interval between the flexor carpi radialis and the radial artery. The radial artery  was mobilized, and reflected radially, along with the superficial branch of the radial nerve. This was identified and protected throughout the case.  The volar aspect of the radial shaft was exposed, and the fracture site cleaned, and then reduced anatomically and held provisionally with a clamp, followed by a K wire.  Initially I tried to place a 6 hole plate, although my first screw was forced to be immediately adjacent to the fracture site, and during the placement of the screw the screw actually expanded and fractured into the fracture site. Therefore I removed that plate, converted to a 7 hole plate in order to span the fracture. The fracture itself was fairly transverse, and achievement of a lag screw was tenuous, and given the fact that I aren't fractured near the fracture with the previous screw, I did not think I wasn't able to achieve a lag screw.  Therefore I secured the plate in the central portion the bone distally first, reduced the fracture anatomically, and then placed another screw proximally with a compression type technique. Excellent compression was achieved. I did contour the plate slightly with a volar band, pre-bending the plate prior to its application onto the radial shaft.  I confirmed position on AP and lateral views, and then completed the fixation with a total of 3 proximal cortical screws and 3 distal cortical screws.  I used the C-arm to stress the DRUJ, and was found to be satisfactory instability and position.  The elbow also had full motion with normal concentric feel.  I then irrigated was copiously and released the tourniquet and the radial artery was found to be intact and well preserved. The subcutaneous tissues closed with Vicryl followed by Monocryl for the skin. She had a regional block preoperatively. Steri-Strips and sterile gauze  were applied followed by a posterior splint.  She was awakened and returned to the PACU in stable and satisfactory condition.  There were no complications and she tolerated the procedure well.

## 2013-01-06 NOTE — Progress Notes (Signed)
Assisted Dr. Fitzgerald with right, ultrasound guided, supraclavicular block. Side rails up, monitors on throughout procedure. See vital signs in flow sheet. Tolerated Procedure well. 
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
## 2013-01-06 NOTE — Transfer of Care (Signed)
Immediate Anesthesia Transfer of Care Note  Patient: Penny Yates  Procedure(s) Performed: Procedure(s): RIGHT OPEN REDUCTION INTERNAL FIXATION (ORIF) DISTAL ARTICULATING RADIAL FRACTURE  (Right)  Patient Location: PACU  Anesthesia Type:GA combined with regional for post-op pain  Level of Consciousness: sedated  Airway & Oxygen Therapy: Patient Spontanous Breathing and Patient connected to face mask oxygen  Post-op Assessment: Report given to PACU RN and Post -op Vital signs reviewed and stable  Post vital signs: Reviewed and stable  Complications: No apparent anesthesia complications

## 2013-01-06 NOTE — Anesthesia Procedure Notes (Addendum)
Anesthesia Regional Block:  Supraclavicular block  Pre-Anesthetic Checklist: ,, timeout performed, Correct Patient, Correct Site, Correct Laterality, Correct Procedure, Correct Position, site marked, Risks and benefits discussed, pre-op evaluation, post-op pain management  Laterality: Right  Prep: Maximum Sterile Barrier Precautions used and chloraprep       Needles:  Injection technique: Single-shot  Needle Type: Echogenic Stimulator Needle     Needle Length: 5cm 5 cm Needle Gauge: 22 and 22 G    Additional Needles:  Procedures: ultrasound guided (picture in chart) Supraclavicular block Narrative:  Start time: 01/06/2013 10:49 AM End time: 01/06/2013 11:00 AM Injection made incrementally with aspirations every 5 mL. Anesthesiologist: Fitzgerald,MD  Additional Notes: 2% Lidocaine skin wheel. Intercostobrachial block with 8cc of 0.5% Bupivicaine plain.  Supraclavicular block Procedure Name: LMA Insertion Date/Time: 01/06/2013 11:50 AM Performed by: Caren Macadam Pre-anesthesia Checklist: Patient identified, Emergency Drugs available, Suction available and Patient being monitored Patient Re-evaluated:Patient Re-evaluated prior to inductionOxygen Delivery Method: Circle System Utilized Preoxygenation: Pre-oxygenation with 100% oxygen Intubation Type: IV induction Ventilation: Mask ventilation without difficulty LMA: LMA inserted LMA Size: 4.0 Number of attempts: 1 Airway Equipment and Method: bite block Placement Confirmation: positive ETCO2 and breath sounds checked- equal and bilateral Tube secured with: Tape Dental Injury: Teeth and Oropharynx as per pre-operative assessment

## 2013-01-06 NOTE — Anesthesia Preprocedure Evaluation (Signed)
Anesthesia Evaluation  Patient identified by MRN, date of birth, ID band Patient awake    Reviewed: Allergy & Precautions, H&P , NPO status , Patient's Chart, lab work & pertinent test results  Airway Mallampati: I TM Distance: >3 FB Neck ROM: Full    Dental no notable dental hx. (+) Teeth Intact and Dental Advisory Given   Pulmonary neg pulmonary ROS,  breath sounds clear to auscultation  Pulmonary exam normal       Cardiovascular hypertension, On Medications negative cardio ROS  Rhythm:Regular Rate:Normal     Neuro/Psych Depression negative neurological ROS     GI/Hepatic negative GI ROS, Neg liver ROS,   Endo/Other  diabetes, Well ControlledHyperthyroidism   Renal/GU negative Renal ROS  negative genitourinary   Musculoskeletal   Abdominal   Peds  Hematology negative hematology ROS (+) anemia ,   Anesthesia Other Findings   Reproductive/Obstetrics negative OB ROS                           Anesthesia Physical Anesthesia Plan  ASA: II  Anesthesia Plan: General and Regional   Post-op Pain Management:    Induction: Intravenous  Airway Management Planned: LMA  Additional Equipment:   Intra-op Plan:   Post-operative Plan: Extubation in OR  Informed Consent: I have reviewed the patients History and Physical, chart, labs and discussed the procedure including the risks, benefits and alternatives for the proposed anesthesia with the patient or authorized representative who has indicated his/her understanding and acceptance.   Dental advisory given  Plan Discussed with: CRNA  Anesthesia Plan Comments:         Anesthesia Quick Evaluation

## 2013-01-07 ENCOUNTER — Encounter (HOSPITAL_BASED_OUTPATIENT_CLINIC_OR_DEPARTMENT_OTHER): Payer: Self-pay | Admitting: Orthopedic Surgery

## 2013-01-18 ENCOUNTER — Other Ambulatory Visit: Payer: Self-pay | Admitting: Internal Medicine

## 2013-01-19 NOTE — Telephone Encounter (Signed)
Refill done.  

## 2013-01-25 ENCOUNTER — Other Ambulatory Visit: Payer: Self-pay | Admitting: Internal Medicine

## 2013-01-27 ENCOUNTER — Other Ambulatory Visit: Payer: Self-pay | Admitting: Internal Medicine

## 2013-02-07 ENCOUNTER — Other Ambulatory Visit: Payer: Self-pay | Admitting: Internal Medicine

## 2013-02-19 ENCOUNTER — Ambulatory Visit (INDEPENDENT_AMBULATORY_CARE_PROVIDER_SITE_OTHER): Payer: BC Managed Care – PPO

## 2013-02-19 DIAGNOSIS — Z23 Encounter for immunization: Secondary | ICD-10-CM

## 2013-03-14 ENCOUNTER — Other Ambulatory Visit: Payer: Self-pay | Admitting: Internal Medicine

## 2013-04-17 ENCOUNTER — Other Ambulatory Visit: Payer: Self-pay | Admitting: Internal Medicine

## 2013-04-17 MED ORDER — TRAMADOL HCL ER 200 MG PO TB24: ORAL_TABLET | ORAL | Status: DC

## 2013-04-17 NOTE — Telephone Encounter (Signed)
Faxed hardcopy to [facility] [place] 

## 2013-04-17 NOTE — Telephone Encounter (Signed)
Done hardcopy to robin  

## 2013-04-28 ENCOUNTER — Other Ambulatory Visit (INDEPENDENT_AMBULATORY_CARE_PROVIDER_SITE_OTHER): Payer: BC Managed Care – PPO

## 2013-04-28 ENCOUNTER — Encounter: Payer: Self-pay | Admitting: Internal Medicine

## 2013-04-28 ENCOUNTER — Ambulatory Visit: Payer: BC Managed Care – PPO | Admitting: Internal Medicine

## 2013-04-28 DIAGNOSIS — E1165 Type 2 diabetes mellitus with hyperglycemia: Principal | ICD-10-CM

## 2013-04-28 DIAGNOSIS — IMO0001 Reserved for inherently not codable concepts without codable children: Secondary | ICD-10-CM

## 2013-04-28 DIAGNOSIS — Z0000 Encounter for general adult medical examination without abnormal findings: Secondary | ICD-10-CM

## 2013-04-28 LAB — BASIC METABOLIC PANEL
BUN: 12 mg/dL (ref 6–23)
CHLORIDE: 102 meq/L (ref 96–112)
CO2: 31 mEq/L (ref 19–32)
Calcium: 9.5 mg/dL (ref 8.4–10.5)
Creatinine, Ser: 1.1 mg/dL (ref 0.4–1.2)
GFR: 66.66 mL/min (ref >60.00)
GLUCOSE: 114 mg/dL — AB (ref 70–99)
POTASSIUM: 3.6 meq/L (ref 3.5–5.1)
SODIUM: 141 meq/L (ref 135–145)

## 2013-04-28 LAB — CBC WITH DIFFERENTIAL/PLATELET
BASOS ABS: 0 10*3/uL (ref 0.0–0.1)
Basophils Relative: 0.4 % (ref 0.0–3.0)
Eosinophils Absolute: 0.2 10*3/uL (ref 0.0–0.7)
Eosinophils Relative: 3 % (ref 0.0–5.0)
HEMATOCRIT: 41 % (ref 36.0–46.0)
Hemoglobin: 13.9 g/dL (ref 12.0–15.0)
LYMPHS ABS: 4 10*3/uL (ref 0.7–4.0)
Lymphocytes Relative: 48.5 % — ABNORMAL HIGH (ref 12.0–46.0)
MCHC: 34 g/dL (ref 30.0–36.0)
MCV: 84.3 fl (ref 78.0–100.0)
MONO ABS: 0.4 10*3/uL (ref 0.1–1.0)
MONOS PCT: 5.1 % (ref 3.0–12.0)
Neutro Abs: 3.5 10*3/uL (ref 1.4–7.7)
Neutrophils Relative %: 43 % (ref 43.0–77.0)
PLATELETS: 231 10*3/uL (ref 150.0–400.0)
RBC: 4.87 Mil/uL (ref 3.87–5.11)
RDW: 15.9 % — AB (ref 11.5–14.6)
WBC: 8.2 10*3/uL (ref 4.5–10.5)

## 2013-04-28 LAB — URINALYSIS, ROUTINE W REFLEX MICROSCOPIC
BILIRUBIN URINE: NEGATIVE
HGB URINE DIPSTICK: NEGATIVE
KETONES UR: NEGATIVE
LEUKOCYTES UA: NEGATIVE
Nitrite: NEGATIVE
Specific Gravity, Urine: 1.025 (ref 1.000–1.030)
Total Protein, Urine: NEGATIVE
UROBILINOGEN UA: 0.2 (ref 0.0–1.0)
Urine Glucose: NEGATIVE
pH: 6 (ref 5.0–8.0)

## 2013-04-28 LAB — LIPID PANEL
Cholesterol: 188 mg/dL (ref 0–200)
HDL: 78.4 mg/dL (ref >39.00)
LDL Cholesterol: 96 mg/dL (ref 0–99)
Total CHOL/HDL Ratio: 2
Triglycerides: 69 mg/dL (ref 0.0–149.0)
VLDL: 13.8 mg/dL (ref 0.0–40.0)

## 2013-04-28 LAB — HEPATIC FUNCTION PANEL
ALBUMIN: 3.9 g/dL (ref 3.5–5.2)
ALK PHOS: 117 U/L (ref 39–117)
ALT: 17 U/L (ref 0–35)
AST: 17 U/L (ref 0–37)
Bilirubin, Direct: 0.1 mg/dL (ref 0.0–0.3)
Total Bilirubin: 0.5 mg/dL (ref 0.3–1.2)
Total Protein: 7.4 g/dL (ref 6.0–8.3)

## 2013-04-28 LAB — MICROALBUMIN / CREATININE URINE RATIO
CREATININE, U: 295.1 mg/dL
MICROALB UR: 1.5 mg/dL (ref 0.0–1.9)
MICROALB/CREAT RATIO: 0.5 mg/g (ref 0.0–30.0)

## 2013-04-28 LAB — TSH: TSH: 6.76 u[IU]/mL — AB (ref 0.35–5.50)

## 2013-04-28 LAB — HEMOGLOBIN A1C: HEMOGLOBIN A1C: 5.8 % (ref 4.6–6.5)

## 2013-05-06 ENCOUNTER — Encounter: Payer: Self-pay | Admitting: Internal Medicine

## 2013-05-06 ENCOUNTER — Ambulatory Visit (INDEPENDENT_AMBULATORY_CARE_PROVIDER_SITE_OTHER): Payer: BC Managed Care – PPO | Admitting: Internal Medicine

## 2013-05-06 VITALS — BP 142/90 | HR 88 | Temp 99.4°F | Ht 68.5 in | Wt 176.3 lb

## 2013-05-06 DIAGNOSIS — E1165 Type 2 diabetes mellitus with hyperglycemia: Secondary | ICD-10-CM

## 2013-05-06 DIAGNOSIS — Z0000 Encounter for general adult medical examination without abnormal findings: Secondary | ICD-10-CM

## 2013-05-06 DIAGNOSIS — IMO0001 Reserved for inherently not codable concepts without codable children: Secondary | ICD-10-CM

## 2013-05-06 LAB — HM MAMMOGRAPHY

## 2013-05-06 NOTE — Progress Notes (Signed)
Pre-visit discussion using our clinic review tool. No additional management support is needed unless otherwise documented below in the visit note.  

## 2013-05-06 NOTE — Progress Notes (Signed)
Subjective:    Patient ID: Penny Yates, female    DOB: 05/16/1955, 58 y.o.   MRN: [id number]  HPI  Here for wellness and f/u;  Overall doing ok;  Pt denies CP, worsening SOB, DOE, wheezing, orthopnea, PND, worsening LE edema, palpitations, dizziness or syncope.  Pt denies neurological change such as new headache, facial or extremity weakness.  Pt denies polydipsia, polyuria, or low sugar symptoms. Pt states overall good compliance with treatment and medications, good tolerability, and has been trying to follow lower cholesterol diet.  Pt denies worsening depressive symptoms, suicidal ideation or panic. No fever, night sweats, wt loss, loss of appetite, or other constitutional symptoms.  Pt states good ability with ADL's, has low fall risk, home safety reviewed and adequate, no other significant changes in hearing or vision, Not taking k for 3 wks as she was out.  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 Denies worsening depressive symptoms, suicidal ideation, or panic Past Medical History  Diagnosis Date  . Anemia   . History of colonic polyps     multiple of succeeding years 96, 97, 98, and 99  . Hyperlipidemia   . Hypertension   . Chronic venous insufficiency     LE's  . Low back pain   . Sciatica   . Leg length discrepancy     right leg longer  . Hyperthyroidism     s/p rad Iodine-ellison  . Sacroiliitis     history of  . Depression   . Diabetes mellitus without complication   . Lymphedema     left arm, both legs; primary lymphedema  . Fracture of shaft of right radius 01/06/2013   Past Surgical History  Procedure Laterality Date  . Tubal ligation    . 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 Revision of scar tissue rectus muscle  11/08    right knee  . Eye surgery      both eyes-muscle repair  . Open reduction internal fixation (orif) distal radial fracture Right 01/06/2013    Procedure: RIGHT OPEN REDUCTION INTERNAL FIXATION  (ORIF) DISTAL ARTICULATING RADIAL FRACTURE ;  Surgeon: Eulas Post, MD;  Location: [facility];  Service: Orthopedics;  Laterality: Right;    reports that she has never smoked. She has never used smokeless tobacco. She reports that she does not drink alcohol or use illicit drugs. family history includes Cancer in her mother; Colon cancer (age of onset: 11) in her mother; Diabetes in her mother and another family member; Heart disease in her maternal grandmother; Kidney disease in an other family member; Stroke in an other family member. There is no history of Thyroid disease, Esophageal cancer, Rectal cancer, or Stomach cancer. Allergies  Allergen Reactions  . Ace Inhibitors Cough  . Lisinopril     Cough   Current Outpatient Prescriptions on File Prior to Visit  Medication Sig Dispense Refill  . aspirin 81 MG tablet Take 81 mg by mouth daily.        Marland Kitchen atorvastatin (LIPITOR) 10 MG tablet TAKE 1 TABLET (10 MG TOTAL) BY MOUTH DAILY.  90 tablet  3  . Biotin 5000 MCG CAPS Take by mouth.        . Calcium Carbonate-Vitamin D 300-100 MG-UNIT CAPS Take by mouth. Take one daily        . Cholecalciferol (VITAMIN D3) 1000 UNITS CAPS Take 2,000 Units by mouth daily.        Marland Kitchen  diltiazem (CARDIZEM CD) 360 MG 24 hr capsule Take 1 capsule (360 mg total) by mouth daily.  90 capsule  3  . fish oil-omega-3 fatty acids 1000 MG capsule Take 1 capsule by mouth daily.       . furosemide (LASIX) 40 MG tablet TAKE 1 TABLET BY MOUTH EVERY DAY  90 tablet  3  . guanFACINE (TENEX) 1 MG tablet Take 1 tablet (1 mg total) by mouth at bedtime.  90 tablet  3  . irbesartan (AVAPRO) 300 MG tablet TAKE 1 TABLET BY MOUTH EVERY DAY  90 tablet  3  . KLOR-CON M20 20 MEQ tablet TAKE 1 TABLET (20 MEQ TOTAL) BY MOUTH DAILY.  90 tablet  2  . methocarbamol (ROBAXIN) 500 MG tablet Take 1 tablet (500 mg total) by mouth 4 (four) times daily.  75 tablet  1  . Multiple Vitamin (MULTIVITAMIN) capsule Take 1 capsule by mouth  daily.        Marland Kitchen oxyCODONE-acetaminophen (PERCOCET) 10-325 MG per tablet Take 1-2 tablets by mouth every 6 (six) hours as needed for pain. MAXIMUM TOTAL ACETAMINOPHEN DOSE IS 4000 MG PER DAY  75 tablet  0  . sennosides-docusate sodium (SENOKOT-S) 8.6-50 MG tablet Take 2 tablets by mouth daily.  30 tablet  1  . traMADol (ULTRAM-ER) 200 MG 24 hr tablet TAKE 1 TABLET (200 MG TOTAL) BY MOUTH DAILY AS NEEDED FOR PAIN.  30 tablet  3  . venlafaxine XR (EFFEXOR-XR) 37.5 MG 24 hr capsule TAKE ONE CAPSULE BY MOUTH EVERY DAY  90 capsule  1   No current facility-administered medications on file prior to visit.   Review of Systems Constitutional: Negative for diaphoresis, activity change, appetite change or unexpected weight change.  HENT: Negative for hearing loss, ear pain, facial swelling, mouth sores and neck stiffness.   Eyes: Negative for pain, redness and visual disturbance.  Respiratory: Negative for shortness of breath and wheezing.   Cardiovascular: Negative for chest pain and palpitations.  Gastrointestinal: Negative for diarrhea, blood in stool, abdominal distention or other pain Genitourinary: Negative for hematuria, flank pain or change in urine volume.  Musculoskeletal: Negative for myalgias and joint swelling.  Skin: Negative for color change and wound.  Neurological: Negative for syncope and numbness. other than noted Hematological: Negative for adenopathy.  Psychiatric/Behavioral: Negative for hallucinations, self-injury, decreased concentration and agitation.      Objective:   Physical Exam BP 142/90  Pulse 88  Temp(Src) 99.4 F (37.4 C) (Oral)  Ht 5' 8.5" (1.74 m)  Wt 176 lb 5 oz (79.975 kg)  BMI 26.42 kg/m2  SpO2 97% VS noted,  Constitutional: Pt is oriented to person, place, and time. Appears well-developed and well-nourished.  Head: Normocephalic and atraumatic.  Right Ear: External ear normal.  Left Ear: External ear normal.  Nose: Nose normal.  Mouth/Throat:  Oropharynx is clear and moist.  Eyes: Conjunctivae and EOM are normal. Pupils are equal, round, and reactive to light.  Neck: Normal range of motion. Neck supple. No JVD present. No tracheal deviation present.  Cardiovascular: Normal rate, regular rhythm, normal heart sounds and intact distal pulses.   Pulmonary/Chest: Effort normal and breath sounds normal.  Abdominal: Soft. Bowel sounds are normal. There is no tenderness. No HSM  Musculoskeletal: Normal range of motion. Exhibits no edema.  Lymphadenopathy:  Has no cervical adenopathy.  Neurological: Pt is alert and oriented to person, place, and time. Pt has normal reflexes. No cranial nerve deficit.  Skin: Skin is warm and dry.  No rash noted.  Psychiatric:  Has  normal mood and affect. Behavior is normal.     Assessment & Plan:

## 2013-05-06 NOTE — Assessment & Plan Note (Signed)

## 2013-05-06 NOTE — Assessment & Plan Note (Signed)
stable overall by history and exam, recent data reviewed with pt, and pt to continue medical treatment as before,  to f/u any worsening symptoms or concerns Lab Results  Component Value Date   HGBA1C 5.8 04/28/2013

## 2013-05-06 NOTE — Patient Instructions (Addendum)
Please continue all other medications as before, and refills have been done if requested.  Please have the pharmacy call with any other refills you may need.  Please continue your efforts at being more active, low cholesterol diet, and weight control.  You are otherwise up to date with prevention measures today.  Please keep your appointments with your specialists as you may have planned  Please remember to sign up for MyChart if you have not done so, as this will be important to you in the future with finding out test results, communicating by private email, and scheduling acute appointments online when needed.  Please return in 6 months, or sooner if needed, with Lab testing done 3-5 days before  

## 2013-05-07 ENCOUNTER — Telehealth: Payer: Self-pay

## 2013-05-07 NOTE — Telephone Encounter (Signed)
Relevant patient education assigned to patient using Emmi. ° °

## 2013-05-09 ENCOUNTER — Other Ambulatory Visit: Payer: Self-pay | Admitting: Internal Medicine

## 2013-05-11 ENCOUNTER — Other Ambulatory Visit: Payer: Self-pay | Admitting: Internal Medicine

## 2013-05-17 ENCOUNTER — Encounter: Payer: Self-pay | Admitting: Internal Medicine

## 2013-05-19 MED ORDER — TRAMADOL HCL ER 200 MG PO TB24: ORAL_TABLET | ORAL | Status: DC

## 2013-05-19 NOTE — Telephone Encounter (Signed)
Called the patient left a detailed message to return #30 day of Tramadol in order to get the new #90 day.

## 2013-05-19 NOTE — Telephone Encounter (Signed)
Done hardcopy to robin  

## 2013-05-20 ENCOUNTER — Other Ambulatory Visit: Payer: Self-pay | Admitting: Internal Medicine

## 2013-05-21 ENCOUNTER — Other Ambulatory Visit: Payer: Self-pay | Admitting: Internal Medicine

## 2013-05-21 NOTE — Telephone Encounter (Signed)
I have #90 refill on my desk.  I understood the patient was to return the #30.  Advise

## 2013-05-21 NOTE — Telephone Encounter (Signed)
Faxed hardcopy to [facility] [place] 

## 2013-05-21 NOTE — Telephone Encounter (Signed)
Done hardcopy to robin  

## 2013-07-31 ENCOUNTER — Other Ambulatory Visit: Payer: Self-pay | Admitting: Family Medicine

## 2013-08-19 ENCOUNTER — Encounter: Payer: Self-pay | Admitting: Internal Medicine

## 2013-08-19 ENCOUNTER — Other Ambulatory Visit: Payer: Self-pay

## 2013-08-19 MED ORDER — VENLAFAXINE HCL ER 37.5 MG PO CP24: 37.5000 mg | ORAL_CAPSULE | Freq: Every day | ORAL | Status: DC

## 2013-10-28 ENCOUNTER — Ambulatory Visit: Payer: BC Managed Care – PPO | Admitting: Internal Medicine

## 2013-10-29 ENCOUNTER — Encounter: Payer: Self-pay | Admitting: Internal Medicine

## 2013-10-29 ENCOUNTER — Other Ambulatory Visit (INDEPENDENT_AMBULATORY_CARE_PROVIDER_SITE_OTHER): Payer: Managed Care, Other (non HMO)

## 2013-10-29 ENCOUNTER — Ambulatory Visit (INDEPENDENT_AMBULATORY_CARE_PROVIDER_SITE_OTHER): Payer: Managed Care, Other (non HMO) | Admitting: Internal Medicine

## 2013-10-29 VITALS — BP 132/88 | HR 87 | Temp 98.5°F | Ht 68.0 in | Wt 171.2 lb

## 2013-10-29 DIAGNOSIS — IMO0001 Reserved for inherently not codable concepts without codable children: Secondary | ICD-10-CM

## 2013-10-29 DIAGNOSIS — R609 Edema, unspecified: Secondary | ICD-10-CM

## 2013-10-29 DIAGNOSIS — E785 Hyperlipidemia, unspecified: Secondary | ICD-10-CM

## 2013-10-29 DIAGNOSIS — I10 Essential (primary) hypertension: Secondary | ICD-10-CM

## 2013-10-29 DIAGNOSIS — E1165 Type 2 diabetes mellitus with hyperglycemia: Principal | ICD-10-CM

## 2013-10-29 DIAGNOSIS — Z0000 Encounter for general adult medical examination without abnormal findings: Secondary | ICD-10-CM

## 2013-10-29 LAB — HEPATIC FUNCTION PANEL
ALT: 15 U/L (ref 0–35)
AST: 17 U/L (ref 0–37)
Albumin: 3.5 g/dL (ref 3.5–5.2)
Alkaline Phosphatase: 109 U/L (ref 39–117)
BILIRUBIN TOTAL: 0.6 mg/dL (ref 0.2–1.2)
Bilirubin, Direct: 0.1 mg/dL (ref 0.0–0.3)
Total Protein: 6.6 g/dL (ref 6.0–8.3)

## 2013-10-29 LAB — LIPID PANEL
CHOL/HDL RATIO: 2
Cholesterol: 167 mg/dL (ref 0–200)
HDL: 80.5 mg/dL (ref >39.00)
LDL CALC: 77 mg/dL (ref 0–99)
NonHDL: 86.5
TRIGLYCERIDES: 46 mg/dL (ref 0.0–149.0)
VLDL: 9.2 mg/dL (ref 0.0–40.0)

## 2013-10-29 LAB — BASIC METABOLIC PANEL
BUN: 15 mg/dL (ref 6–23)
CHLORIDE: 104 meq/L (ref 96–112)
CO2: 31 mEq/L (ref 19–32)
CREATININE: 0.9 mg/dL (ref 0.4–1.2)
Calcium: 9.7 mg/dL (ref 8.4–10.5)
GFR: 82.13 mL/min (ref >60.00)
Glucose, Bld: 132 mg/dL — ABNORMAL HIGH (ref 70–99)
Potassium: 4.8 mEq/L (ref 3.5–5.1)
Sodium: 141 mEq/L (ref 135–145)

## 2013-10-29 LAB — HEMOGLOBIN A1C: Hgb A1c MFr Bld: 5.8 % (ref 4.6–6.5)

## 2013-10-29 NOTE — Progress Notes (Signed)
Pre visit review using our clinic review tool, if applicable. No additional management support is needed unless otherwise documented below in the visit note. 

## 2013-10-29 NOTE — Progress Notes (Signed)
Subjective:    Patient ID: Penny Yates, female    DOB: 05/10/1955, 58 y.o.   MRN: [id number]  HPI  Here to f/u; overall doing ok,  Pt denies chest pain, increased sob or doe, wheezing, orthopnea, PND, increased LE swelling, palpitations, dizziness or syncope.  Pt denies polydipsia, polyuria, or low sugar symptoms such as weakness or confusion improved with po intake.  Pt denies new neurological symptoms such as new headache, or facial or extremity weakness or numbness.   Pt states overall good compliance with meds, has been trying to follow lower cholesterol, diabetic diet, with wt overall stable,  but little exercise however. Never seems to feel hungry, no wt loss. Also new dx glaucoma now on tx.  Not currently working, laid off last job. Past Medical History  Diagnosis Date  . Anemia   . History of colonic polyps     multiple of succeeding years 96, 97, 98, and 99  . Hyperlipidemia   . Hypertension   . Chronic venous insufficiency     LE's  . Low back pain   . Sciatica   . Leg length discrepancy     right leg longer  . Hyperthyroidism     s/p rad Iodine-ellison  . Sacroiliitis     history of  . Depression   . Diabetes mellitus without complication   . Lymphedema     left arm, both legs; primary lymphedema  . Fracture of shaft of right radius 01/06/2013   Past Surgical History  Procedure Laterality Date  . Tubal ligation    . 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 Revision of scar tissue rectus muscle  11/08    right knee  . Eye surgery      both eyes-muscle repair  . Open reduction internal fixation (orif) distal radial fracture Right 01/06/2013    Procedure: RIGHT OPEN REDUCTION INTERNAL FIXATION (ORIF) DISTAL ARTICULATING RADIAL FRACTURE ;  Surgeon: Eulas PostJoshua P Landau, MD;  Location: [facility];  Service: Orthopedics;  Laterality: Right;    reports that she has never smoked. She has never used smokeless tobacco. She reports that she does not drink  alcohol or use illicit drugs. family history includes Cancer in her mother; Colon cancer (age of onset: 6654) in her mother; Diabetes in her mother and another family member; Heart disease in her maternal grandmother; Kidney disease in an other family member; Stroke in an other family member. There is no history of Thyroid disease, Esophageal cancer, Rectal cancer, or Stomach cancer. Allergies  Allergen Reactions  . Ace Inhibitors Cough  . Lisinopril     Cough    Review of Systems  Constitutional: Negative for unusual diaphoresis or other sweats  HENT: Negative for ringing in ear Eyes: Negative for double vision or worsening visual disturbance.  Respiratory: Negative for choking and stridor.   Gastrointestinal: Negative for vomiting or other signifcant bowel change Genitourinary: Negative for hematuria or decreased urine volume.  Musculoskeletal: Negative for other MSK pain or swelling Skin: Negative for color change and worsening wound.  Neurological: Negative for tremors and numbness other than noted  Psychiatric/Behavioral: Negative for decreased concentration or agitation other than above       Objective:   Physical Exam BP 132/88  Pulse 87  Temp(Src) 98.5 F (36.9 C) (Oral)  Ht 5\' 8"  (1.727 m)  Wt 171 lb 4 oz (77.678 kg)  BMI 26.04 kg/m2  SpO2 99% VS noted,  Constitutional:  Pt appears well-developed, well-nourished.  HENT: Head: NCAT.  Right Ear: External ear normal.  Left Ear: External ear normal.  Eyes: . Pupils are equal, round, and reactive to light. Conjunctivae and EOM are normal Neck: Normal range of motion. Neck supple.  Cardiovascular: Normal rate and regular rhythm.   Pulmonary/Chest: Effort normal and breath sounds normal.  Abd:  Soft, NT, ND, + BS Neurological: Pt is alert. Not confused , motor grossly intact Skin: Skin is warm. No rash, no LE edema Psychiatric: Pt behavior is normal. No agitation.     Assessment & Plan:

## 2013-10-29 NOTE — Patient Instructions (Signed)
Please continue all other medications as before, and refills have been done if requested.  Please have the pharmacy call with any other refills you may need.  Please continue your efforts at being more active, low cholesterol diet, and weight control.  You are otherwise up to date with prevention measures today.  Please keep your appointments with your specialists as you may have planned  Please return in 6 months, or sooner if needed, with Lab testing done 3-5 days before  

## 2013-11-01 NOTE — Assessment & Plan Note (Signed)
stable overall by history and exam, recent data reviewed with pt, and pt to continue medical treatment as before,  to f/u any worsening symptoms or concerns BP Readings from Last 3 Encounters:  10/29/13 132/88  05/06/13 142/90  01/06/13 157/83

## 2013-11-01 NOTE — Assessment & Plan Note (Signed)
stable overall by history and exam, recent data reviewed with pt, and pt to continue medical treatment as before,  to f/u any worsening symptoms or concerns Lab Results  Component Value Date   LDLCALC 77 10/29/2013

## 2013-11-01 NOTE — Assessment & Plan Note (Signed)
stable overall by history and exam, and pt to continue medical treatment as before,  to f/u any worsening symptoms or concerns 

## 2013-11-01 NOTE — Assessment & Plan Note (Signed)
stable overall by history and exam, recent data reviewed with pt, and pt to continue medical treatment as before,  to f/u any worsening symptoms or concerns Lab Results  Component Value Date   HGBA1C 5.8 10/29/2013    

## 2013-11-04 ENCOUNTER — Ambulatory Visit: Payer: BC Managed Care – PPO | Admitting: Internal Medicine

## 2013-11-18 ENCOUNTER — Other Ambulatory Visit: Payer: Self-pay | Admitting: Internal Medicine

## 2013-11-25 ENCOUNTER — Other Ambulatory Visit: Payer: Self-pay | Admitting: Internal Medicine

## 2013-11-25 NOTE — Telephone Encounter (Signed)
Hardcopy faxed to [facility]

## 2013-11-25 NOTE — Telephone Encounter (Signed)
Done hardcopy to robin  

## 2013-12-14 ENCOUNTER — Encounter: Payer: Self-pay | Admitting: Internal Medicine

## 2013-12-30 ENCOUNTER — Encounter: Payer: Self-pay | Admitting: Internal Medicine

## 2014-01-15 LAB — HM DIABETES EYE EXAM

## 2014-01-20 ENCOUNTER — Encounter: Payer: Self-pay | Admitting: Internal Medicine

## 2014-02-12 ENCOUNTER — Other Ambulatory Visit: Payer: Self-pay | Admitting: Internal Medicine

## 2014-02-26 ENCOUNTER — Other Ambulatory Visit: Payer: Self-pay | Admitting: Internal Medicine

## 2014-02-26 NOTE — Telephone Encounter (Signed)
Done erx 

## 2014-03-30 ENCOUNTER — Other Ambulatory Visit: Payer: Self-pay | Admitting: Internal Medicine

## 2014-04-04 ENCOUNTER — Other Ambulatory Visit: Payer: Self-pay | Admitting: Internal Medicine

## 2014-05-12 ENCOUNTER — Other Ambulatory Visit: Payer: Self-pay | Admitting: Internal Medicine

## 2014-06-07 ENCOUNTER — Other Ambulatory Visit: Payer: Self-pay | Admitting: Internal Medicine

## 2014-06-08 NOTE — Telephone Encounter (Signed)
Faxed script back to [facility].../lmb 

## 2014-06-08 NOTE — Telephone Encounter (Signed)
Done hardcopy to rachel  

## 2014-06-15 ENCOUNTER — Other Ambulatory Visit: Payer: Self-pay | Admitting: Internal Medicine

## 2014-07-13 ENCOUNTER — Ambulatory Visit (INDEPENDENT_AMBULATORY_CARE_PROVIDER_SITE_OTHER): Payer: BLUE CROSS/BLUE SHIELD | Admitting: Internal Medicine

## 2014-07-13 ENCOUNTER — Other Ambulatory Visit (INDEPENDENT_AMBULATORY_CARE_PROVIDER_SITE_OTHER): Payer: BLUE CROSS/BLUE SHIELD

## 2014-07-13 ENCOUNTER — Encounter: Payer: Self-pay | Admitting: Internal Medicine

## 2014-07-13 VITALS — BP 126/82 | HR 59 | Temp 98.7°F | Resp 18 | Ht 68.0 in | Wt 169.0 lb

## 2014-07-13 DIAGNOSIS — R7302 Impaired glucose tolerance (oral): Secondary | ICD-10-CM | POA: Diagnosis not present

## 2014-07-13 DIAGNOSIS — Z0000 Encounter for general adult medical examination without abnormal findings: Secondary | ICD-10-CM | POA: Diagnosis not present

## 2014-07-13 DIAGNOSIS — Z0189 Encounter for other specified special examinations: Secondary | ICD-10-CM

## 2014-07-13 LAB — BASIC METABOLIC PANEL
BUN: 10 mg/dL (ref 6–23)
CHLORIDE: 104 meq/L (ref 96–112)
CO2: 34 meq/L — AB (ref 19–32)
Calcium: 9.5 mg/dL (ref 8.4–10.5)
Creatinine, Ser: 0.89 mg/dL (ref 0.40–1.20)
GFR: 83 mL/min (ref >60.00)
Glucose, Bld: 85 mg/dL (ref 70–99)
POTASSIUM: 4.2 meq/L (ref 3.5–5.1)
Sodium: 141 mEq/L (ref 135–145)

## 2014-07-13 LAB — CBC WITH DIFFERENTIAL/PLATELET
BASOS ABS: 0.1 10*3/uL (ref 0.0–0.1)
BASOS PCT: 1.2 % (ref 0.0–3.0)
EOS ABS: 0.1 10*3/uL (ref 0.0–0.7)
Eosinophils Relative: 1.8 % (ref 0.0–5.0)
HCT: 37.6 % (ref 36.0–46.0)
HEMOGLOBIN: 12.8 g/dL (ref 12.0–15.0)
LYMPHS ABS: 3.5 10*3/uL (ref 0.7–4.0)
Lymphocytes Relative: 46.6 % — ABNORMAL HIGH (ref 12.0–46.0)
MCHC: 34 g/dL (ref 30.0–36.0)
MCV: 84.9 fl (ref 78.0–100.0)
MONO ABS: 0.5 10*3/uL (ref 0.1–1.0)
Monocytes Relative: 7.1 % (ref 3.0–12.0)
NEUTROS ABS: 3.3 10*3/uL (ref 1.4–7.7)
Neutrophils Relative %: 43.3 % (ref 43.0–77.0)
Platelets: 233 10*3/uL (ref 150.0–400.0)
RBC: 4.43 Mil/uL (ref 3.87–5.11)
RDW: 16.1 % — AB (ref 11.5–15.5)
WBC: 7.6 10*3/uL (ref 4.0–10.5)

## 2014-07-13 LAB — URINALYSIS, ROUTINE W REFLEX MICROSCOPIC
Hgb urine dipstick: NEGATIVE
LEUKOCYTES UA: NEGATIVE
NITRITE: NEGATIVE
PH: 6 (ref 5.0–8.0)
RBC / HPF: NONE SEEN (ref 0–?)
TOTAL PROTEIN, URINE-UPE24: 30 — AB
Urine Glucose: NEGATIVE
Urobilinogen, UA: 0.2 (ref 0.0–1.0)
WBC UA: NONE SEEN (ref 0–?)

## 2014-07-13 LAB — LIPID PANEL
CHOL/HDL RATIO: 2
CHOLESTEROL: 185 mg/dL (ref 0–200)
HDL: 81.7 mg/dL (ref >39.00)
LDL Cholesterol: 94 mg/dL (ref 0–99)
NONHDL: 103.3
TRIGLYCERIDES: 48 mg/dL (ref 0.0–149.0)
VLDL: 9.6 mg/dL (ref 0.0–40.0)

## 2014-07-13 LAB — HEPATIC FUNCTION PANEL
ALBUMIN: 3.8 g/dL (ref 3.5–5.2)
ALT: 12 U/L (ref 0–35)
AST: 13 U/L (ref 0–37)
Alkaline Phosphatase: 96 U/L (ref 39–117)
Bilirubin, Direct: 0.1 mg/dL (ref 0.0–0.3)
TOTAL PROTEIN: 6.9 g/dL (ref 6.0–8.3)
Total Bilirubin: 0.5 mg/dL (ref 0.2–1.2)

## 2014-07-13 LAB — TSH: TSH: 3.87 u[IU]/mL (ref 0.35–4.50)

## 2014-07-13 LAB — HEMOGLOBIN A1C: Hgb A1c MFr Bld: 5.9 % (ref 4.6–6.5)

## 2014-07-13 NOTE — Assessment & Plan Note (Signed)

## 2014-07-13 NOTE — Patient Instructions (Signed)

## 2014-07-13 NOTE — Progress Notes (Signed)
Subjective:    Patient ID: Penny Yates, female    DOB: 24-Jan-1956, 59 y.o.   MRN: [id number]  HPI  Here for wellness and f/u;  Overall doing ok;  Pt denies Chest pain, worsening SOB, DOE, wheezing, orthopnea, PND, worsening LE edema, palpitations, dizziness or syncope.  Pt denies neurological change such as new headache, facial or extremity weakness.  Pt denies polydipsia, polyuria, or low sugar symptoms. Pt states overall good compliance with treatment and medications, good tolerability, and has been trying to follow appropriate diet.  Pt denies worsening depressive symptoms, suicidal ideation or panic. No fever, night sweats, wt loss, loss of appetite, or other constitutional symptoms.  Pt states good ability with ADL's, has low fall risk, home safety reviewed and adequate, no other significant changes in hearing or vision, and only occasionally active with exercise.  Seeing optho for recent onset glaucoma.  No other complaints Past Medical History  Diagnosis Date  . Anemia   . History of colonic polyps     multiple of succeeding years 96, 97, 98, and 99  . Hyperlipidemia   . Hypertension   . Chronic venous insufficiency     LE's  . Low back pain   . Sciatica   . Leg length discrepancy     right leg longer  . Hyperthyroidism     s/p rad Iodine-ellison  . Sacroiliitis     history of  . Depression   . Diabetes mellitus without complication   . Lymphedema     left arm, both legs; primary lymphedema  . Fracture of shaft of right radius 01/06/2013   Past Surgical History  Procedure Laterality Date  . Tubal ligation    . 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 Revision of scar tissue rectus muscle  11/08    right knee  . Eye surgery      both eyes-muscle repair  . Open reduction internal fixation (orif) distal radial fracture Right 01/06/2013    Procedure: RIGHT OPEN REDUCTION INTERNAL FIXATION (ORIF) DISTAL ARTICULATING RADIAL FRACTURE ;  Surgeon: Eulas Post, MD;   Location: [facility];  Service: Orthopedics;  Laterality: Right;    reports that she has never smoked. She has never used smokeless tobacco. She reports that she does not drink alcohol or use illicit drugs. family history includes Cancer in her mother; Colon cancer (age of onset: 37) in her mother; Diabetes in her mother and another family member; Heart disease in her maternal grandmother; Kidney disease in an other family member; Stroke in an other family member. There is no history of Thyroid disease, Esophageal cancer, Rectal cancer, or Stomach cancer. Allergies  Allergen Reactions  . Ace Inhibitors Cough  . Lisinopril     Cough   Current Outpatient Prescriptions on File Prior to Visit  Medication Sig Dispense Refill  . aspirin 81 MG tablet Take 81 mg by mouth daily.      Marland Kitchen atorvastatin (LIPITOR) 10 MG tablet TAKE 1 TABLET (10 MG TOTAL) BY MOUTH DAILY. 90 tablet 2  . Biotin 5000 MCG CAPS Take by mouth.      . Calcium Carbonate-Vitamin D 300-100 MG-UNIT CAPS Take by mouth. Take one daily      . diltiazem (CARDIZEM CD) 360 MG 24 hr capsule TAKE 1 CAPSULE (360 MG TOTAL) BY MOUTH DAILY. 90 capsule 3  . fish oil-omega-3 fatty acids 1000 MG capsule Take 1 capsule by mouth daily.     Marland Kitchen  furosemide (LASIX) 40 MG tablet TAKE 1 TABLET BY MOUTH EVERY DAY 90 tablet 2  . guanFACINE (TENEX) 1 MG tablet TAKE 1 TABLET BY MOUTH AT BEDTIME 30 tablet 5  . irbesartan (AVAPRO) 300 MG tablet TAKE 1 TABLET BY MOUTH EVERY DAY 90 tablet 1  . KLOR-CON M20 20 MEQ tablet TAKE 1 TABLET (20 MEQ TOTAL) BY MOUTH DAILY. 90 tablet 2  . KLOR-CON M20 20 MEQ tablet TAKE 1 TABLET BY MOUTH EVERY DAY 90 tablet 3  . Multiple Vitamin (MULTIVITAMIN) capsule Take 1 capsule by mouth daily.      . timolol (BETIMOL) 0.25 % ophthalmic solution Place 1 drop into both eyes 2 (two) times daily.    . traMADol (ULTRAM-ER) 200 MG 24 hr tablet TAKE 1 TABLET BY MOUTH EVERY DAY AS NEEDED FOR PAIN 90 tablet 1  . venlafaxine XR  (EFFEXOR-XR) 37.5 MG 24 hr capsule TAKE 1 CAPSULE (37.5 MG TOTAL) BY MOUTH DAILY. 90 capsule 2   No current facility-administered medications on file prior to visit.   Review of Systems Constitutional: Negative for increased diaphoresis, other activity, appetite or siginficant weight change other than noted HENT: Negative for worsening hearing loss, ear pain, facial swelling, mouth sores and neck stiffness.   Eyes: Negative for other worsening pain, redness or visual disturbance.  Respiratory: Negative for shortness of breath and wheezing  Cardiovascular: Negative for chest pain and palpitations.  Gastrointestinal: Negative for diarrhea, blood in stool, abdominal distention or other pain Genitourinary: Negative for hematuria, flank pain or change in urine volume.  Musculoskeletal: Negative for myalgias or other joint complaints.  Skin: Negative for color change and wound or drainage.  Neurological: Negative for syncope and numbness. other than noted Hematological: Negative for adenopathy. or other swelling Psychiatric/Behavioral: Negative for hallucinations, SI, self-injury, decreased concentration or other worsening agitation.      Objective:   Physical Exam BP 126/82 mmHg  Pulse 59  Temp(Src) 98.7 F (37.1 C) (Oral)  Resp 18  Ht 5\' 8"  (1.727 m)  Wt 169 lb (76.658 kg)  BMI 25.70 kg/m2  SpO2 96% VS noted,  Constitutional: Pt is oriented to person, place, and time. Appears well-developed and well-nourished, in no significant distress Head: Normocephalic and atraumatic.  Right Ear: External ear normal.  Left Ear: External ear normal.  Nose: Nose normal.  Mouth/Throat: Oropharynx is clear and moist.  Eyes: Conjunctivae and EOM are normal. Pupils are equal, round, and reactive to light.  Neck: Normal range of motion. Neck supple. No JVD present. No tracheal deviation present or significant neck LA or mass Cardiovascular: Normal rate, regular rhythm, normal heart sounds and intact  distal pulses.   Pulmonary/Chest: Effort normal and breath sounds without rales or wheezing  Abdominal: Soft. Bowel sounds are normal. NT. No HSM  Musculoskeletal: Normal range of motion. Exhibits no edema.  Lymphadenopathy:  Has no cervical adenopathy.  Neurological: Pt is alert and oriented to person, place, and time. Pt has normal reflexes. No cranial nerve deficit. Motor grossly intact Skin: Skin is warm and dry. No rash noted.  Psychiatric:  Has normal mood and affect. Behavior is normal.     Assessment & Plan:

## 2014-07-13 NOTE — Progress Notes (Signed)
Pre visit review using our clinic review tool, if applicable. No additional management support is needed unless otherwise documented below in the visit note. 

## 2014-08-10 ENCOUNTER — Other Ambulatory Visit: Payer: Self-pay | Admitting: Internal Medicine

## 2014-08-16 ENCOUNTER — Other Ambulatory Visit: Payer: Self-pay | Admitting: Internal Medicine

## 2014-09-16 ENCOUNTER — Telehealth: Payer: Self-pay

## 2014-09-16 NOTE — Telephone Encounter (Signed)
Pt drop off a Health Screening form for [employer] that required some screening test results. Form was signed by PCP and results printed.  I left several messages on machine for pt to return my call regarding picking up this form. I have mailed the original from with accompanying results to pt's address on file

## 2014-11-03 ENCOUNTER — Other Ambulatory Visit: Payer: Self-pay | Admitting: Internal Medicine

## 2014-11-04 NOTE — Telephone Encounter (Signed)
Can you please advise in dr john's absence---thanks 

## 2014-11-04 NOTE — Telephone Encounter (Signed)
I will provide a 1 month supply until Dr. Raphael GibneyJohn's return.

## 2014-11-04 NOTE — Telephone Encounter (Signed)
Left message advising patient that rx is ready for pick up at front office

## 2014-12-01 ENCOUNTER — Other Ambulatory Visit: Payer: Self-pay | Admitting: Internal Medicine

## 2014-12-06 ENCOUNTER — Other Ambulatory Visit: Payer: Self-pay | Admitting: Family

## 2014-12-27 ENCOUNTER — Other Ambulatory Visit: Payer: Self-pay | Admitting: Internal Medicine

## 2015-01-08 ENCOUNTER — Other Ambulatory Visit: Payer: Self-pay | Admitting: Family

## 2015-01-10 NOTE — Telephone Encounter (Signed)
Last refill 8/16

## 2015-01-12 ENCOUNTER — Encounter: Payer: Self-pay | Admitting: Internal Medicine

## 2015-01-13 ENCOUNTER — Encounter: Payer: Self-pay | Admitting: Internal Medicine

## 2015-01-13 DIAGNOSIS — R739 Hyperglycemia, unspecified: Secondary | ICD-10-CM

## 2015-01-13 DIAGNOSIS — E785 Hyperlipidemia, unspecified: Secondary | ICD-10-CM

## 2015-01-13 NOTE — Telephone Encounter (Signed)
Penny Yates to contact pt - ok for OV at her convenience

## 2015-01-14 ENCOUNTER — Encounter: Payer: Self-pay | Admitting: Internal Medicine

## 2015-01-14 ENCOUNTER — Ambulatory Visit (INDEPENDENT_AMBULATORY_CARE_PROVIDER_SITE_OTHER)
Admission: RE | Admit: 2015-01-14 | Discharge: 2015-01-14 | Disposition: A | Discharge status: Home / Self Care | Payer: BLUE CROSS/BLUE SHIELD | Source: Ambulatory Visit | Attending: Internal Medicine | Admitting: Internal Medicine

## 2015-01-14 ENCOUNTER — Ambulatory Visit (INDEPENDENT_AMBULATORY_CARE_PROVIDER_SITE_OTHER): Payer: BLUE CROSS/BLUE SHIELD | Admitting: Internal Medicine

## 2015-01-14 ENCOUNTER — Other Ambulatory Visit (INDEPENDENT_AMBULATORY_CARE_PROVIDER_SITE_OTHER): Payer: BLUE CROSS/BLUE SHIELD

## 2015-01-14 VITALS — BP 154/96 | HR 72 | Temp 98.1°F | Ht 68.0 in | Wt 164.0 lb

## 2015-01-14 DIAGNOSIS — E785 Hyperlipidemia, unspecified: Secondary | ICD-10-CM | POA: Diagnosis not present

## 2015-01-14 DIAGNOSIS — E119 Type 2 diabetes mellitus without complications: Secondary | ICD-10-CM

## 2015-01-14 DIAGNOSIS — I10 Essential (primary) hypertension: Secondary | ICD-10-CM | POA: Diagnosis not present

## 2015-01-14 DIAGNOSIS — R109 Unspecified abdominal pain: Secondary | ICD-10-CM

## 2015-01-14 DIAGNOSIS — R739 Hyperglycemia, unspecified: Secondary | ICD-10-CM | POA: Diagnosis not present

## 2015-01-14 DIAGNOSIS — Z0189 Encounter for other specified special examinations: Secondary | ICD-10-CM | POA: Diagnosis not present

## 2015-01-14 DIAGNOSIS — R10A1 Flank pain, right side: Secondary | ICD-10-CM

## 2015-01-14 DIAGNOSIS — Z0000 Encounter for general adult medical examination without abnormal findings: Secondary | ICD-10-CM

## 2015-01-14 LAB — CBC WITH DIFFERENTIAL/PLATELET
BASOS PCT: 0.7 % (ref 0.0–3.0)
Basophils Absolute: 0 10*3/uL (ref 0.0–0.1)
EOS PCT: 2.7 % (ref 0.0–5.0)
Eosinophils Absolute: 0.2 10*3/uL (ref 0.0–0.7)
HCT: 37.3 % (ref 36.0–46.0)
Hemoglobin: 12.7 g/dL (ref 12.0–15.0)
LYMPHS ABS: 4 10*3/uL (ref 0.7–4.0)
Lymphocytes Relative: 54.7 % — ABNORMAL HIGH (ref 12.0–46.0)
MCHC: 34 g/dL (ref 30.0–36.0)
MCV: 84.1 fl (ref 78.0–100.0)
MONOS PCT: 6.6 % (ref 3.0–12.0)
Monocytes Absolute: 0.5 10*3/uL (ref 0.1–1.0)
NEUTROS ABS: 2.6 10*3/uL (ref 1.4–7.7)
NEUTROS PCT: 35.3 % — AB (ref 43.0–77.0)
PLATELETS: 265 10*3/uL (ref 150.0–400.0)
RBC: 4.43 Mil/uL (ref 3.87–5.11)
RDW: 15.5 % (ref 11.5–15.5)
WBC: 7.3 10*3/uL (ref 4.0–10.5)

## 2015-01-14 LAB — URINALYSIS, ROUTINE W REFLEX MICROSCOPIC
BILIRUBIN URINE: NEGATIVE
Hgb urine dipstick: NEGATIVE
KETONES UR: NEGATIVE
Leukocytes, UA: NEGATIVE
NITRITE: NEGATIVE
PH: 6 (ref 5.0–8.0)
Total Protein, Urine: NEGATIVE
URINE GLUCOSE: NEGATIVE
UROBILINOGEN UA: 0.2 (ref 0.0–1.0)

## 2015-01-14 LAB — HEPATIC FUNCTION PANEL
ALK PHOS: 89 U/L (ref 39–117)
ALT: 15 U/L (ref 0–35)
AST: 17 U/L (ref 0–37)
Albumin: 3.9 g/dL (ref 3.5–5.2)
BILIRUBIN DIRECT: 0.1 mg/dL (ref 0.0–0.3)
BILIRUBIN TOTAL: 0.3 mg/dL (ref 0.2–1.2)
TOTAL PROTEIN: 7.2 g/dL (ref 6.0–8.3)

## 2015-01-14 LAB — LIPID PANEL
CHOL/HDL RATIO: 2
CHOLESTEROL: 165 mg/dL (ref 0–200)
HDL: 87.4 mg/dL (ref >39.00)
LDL CALC: 70 mg/dL (ref 0–99)
NONHDL: 77.82
Triglycerides: 41 mg/dL (ref 0.0–149.0)
VLDL: 8.2 mg/dL (ref 0.0–40.0)

## 2015-01-14 LAB — BASIC METABOLIC PANEL
BUN: 12 mg/dL (ref 6–23)
CALCIUM: 9.7 mg/dL (ref 8.4–10.5)
CHLORIDE: 103 meq/L (ref 96–112)
CO2: 33 meq/L — AB (ref 19–32)
Creatinine, Ser: 0.85 mg/dL (ref 0.40–1.20)
GFR: 87.38 mL/min (ref >60.00)
Glucose, Bld: 82 mg/dL (ref 70–99)
Potassium: 3.8 mEq/L (ref 3.5–5.1)
SODIUM: 143 meq/L (ref 135–145)

## 2015-01-14 LAB — TSH: TSH: 14.27 u[IU]/mL — ABNORMAL HIGH (ref 0.35–4.50)

## 2015-01-14 LAB — HEMOGLOBIN A1C: Hgb A1c MFr Bld: 5.6 % (ref 4.6–6.5)

## 2015-01-14 MED ORDER — IRBESARTAN 300 MG PO TABS: 300.0000 mg | ORAL_TABLET | Freq: Every day | ORAL | Status: DC

## 2015-01-14 NOTE — Patient Instructions (Addendum)
Please continue all other medications as before, including your pain medication, and re-start the blood pressure medication  Please have the pharmacy call with any other refills you may need.  Please continue your efforts at being more active, low cholesterol diabetic diet, and weight control.  Please keep your appointments with your specialists as you may have planned  You will be contacted regarding the referral for: CT scan to look for kidney stones  Please go to the LAB in the Basement (turn left off the elevator) for the tests to be done today  You will be contacted by phone if any changes need to be made immediately.  Otherwise, you will receive a letter about your results with an explanation, but please check with MyChart first.  Please remember to sign up for MyChart if you have not done so, as this will be important to you in the future with finding out test results, communicating by private email, and scheduling acute appointments online when needed.  Please return in 6 months, or sooner if needed, with Lab testing done 3-5 days before

## 2015-01-14 NOTE — Assessment & Plan Note (Signed)
stable overall by history and exam, recent data reviewed with pt, and pt to continue medical treatment as before,  to f/u any worsening symptoms or concerns Lab Results  Component Value Date   HGBA1C 5.9 07/13/2014   For /fu labs

## 2015-01-14 NOTE — Progress Notes (Signed)
Pre visit review using our clinic review tool, if applicable. No additional management support is needed unless otherwise documented below in the visit note. 

## 2015-01-14 NOTE — Progress Notes (Addendum)
Subjective:    Patient ID: Penny Yates, female    DOB: [birth year], 59 y.o.   MRN: [id number]  HPI  Here after seeing GYN about 3 wks ago, UA with crystals found twice on repeat testing at 2 wks, with a concern for kidney stone, to be seen today, and possible referral to urology.  Denies urinary symptoms such as dysuria, frequency, urgency, flank pain, hematuria or n/v, fever, chills, but also with 2 wks onset right groin and right flank pains, mild intermittent.  Takes tramadol prn for othe pain. Pt denies chest pain, increased sob or doe, wheezing, orthopnea, PND, increased LE swelling, palpitations, dizziness or syncope.  Pt denies new neurological symptoms such as new headache, or facial or extremity weakness or numbness  Did stop her avapro b/c she is tired of taking meds and wants to be off all meds by retirement time.  Due for labs as well, recent random cbg 157.   Pt denies polydipsia, polyuria, or low sugar symptoms such as weakness or confusion improved with po intake.  Past Medical History  Diagnosis Date  . Anemia   . History of colonic polyps     multiple of succeeding years 96, 97, 98, and 99  . Hyperlipidemia   . Hypertension   . Chronic venous insufficiency     LE's  . Low back pain   . Sciatica   . Leg length discrepancy     right leg longer  . Hyperthyroidism     s/p rad Iodine-ellison  . Sacroiliitis     history of  . Depression   . Diabetes mellitus without complication   . Lymphedema     left arm, both legs; primary lymphedema  . Fracture of shaft of right radius 01/06/2013   Past Surgical History  Procedure Laterality Date  . Tubal ligation    . 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 Revision of scar tissue rectus muscle  11/08    right knee  . Eye surgery      both eyes-muscle repair  . Open reduction internal fixation (orif) distal radial fracture Right 01/06/2013    Procedure: RIGHT OPEN REDUCTION INTERNAL FIXATION (ORIF) DISTAL ARTICULATING  RADIAL FRACTURE ;  Surgeon: Eulas Post, MD;  Location: [facility];  Service: Orthopedics;  Laterality: Right;    reports that she has never smoked. She has never used smokeless tobacco. She reports that she does not drink alcohol or use illicit drugs. family history includes Cancer in her mother; Colon cancer (age of onset: 45) in her mother; Diabetes in her mother and another family member; Heart disease in her maternal grandmother; Kidney disease in an other family member; Stroke in an other family member. There is no history of Thyroid disease, Esophageal cancer, Rectal cancer, or Stomach cancer. Allergies  Allergen Reactions  . Ace Inhibitors Cough  . Lisinopril     Cough   Current Outpatient Prescriptions on File Prior to Visit  Medication Sig Dispense Refill  . aspirin 81 MG tablet Take 81 mg by mouth daily.      Marland Kitchen atorvastatin (LIPITOR) 10 MG tablet TAKE 1 TABLET (10 MG TOTAL) BY MOUTH DAILY. 90 tablet 2  . Biotin 5000 MCG CAPS Take by mouth.      . Calcium Carbonate-Vitamin D 300-100 MG-UNIT CAPS Take by mouth. Take one daily      . diltiazem (CARDIZEM CD) 360 MG 24 hr capsule TAKE 1 CAPSULE (360  MG TOTAL) BY MOUTH DAILY. 90 capsule 3  . fish oil-omega-3 fatty acids 1000 MG capsule Take 1 capsule by mouth daily.     . furosemide (LASIX) 40 MG tablet TAKE 1 TABLET BY MOUTH EVERY DAY 90 tablet 2  . KLOR-CON M20 20 MEQ tablet TAKE 1 TABLET (20 MEQ TOTAL) BY MOUTH DAILY. 90 tablet 2  . Multiple Vitamin (MULTIVITAMIN) capsule Take 1 capsule by mouth daily.      . timolol (BETIMOL) 0.25 % ophthalmic solution Place 1 drop into both eyes 2 (two) times daily.    . traMADol (ULTRAM-ER) 200 MG 24 hr tablet TAKE 1 TABLET BY MOUTH EVERY DAY AS NEEDED FOR PAIN 30 tablet 0  . venlafaxine XR (EFFEXOR-XR) 37.5 MG 24 hr capsule TAKE 1 CAPSULE (37.5 MG TOTAL) BY MOUTH DAILY. 90 capsule 1  . Dorzolamide HCl-Timolol Mal PF 22.3-6.8 MG/ML SOLN Apply 1 drop to eye 2 (two) times daily. 1  drop in each eye twice a day.    . guanFACINE (TENEX) 1 MG tablet TAKE 1 TABLET BY MOUTH AT BEDTIME (Patient not taking: Reported on 01/14/2015) 30 tablet 5  . irbesartan (AVAPRO) 300 MG tablet TAKE 1 TABLET BY MOUTH EVERY DAY (Patient not taking: Reported on 01/14/2015) 90 tablet 3   No current facility-administered medications on file prior to visit.   Review of Systems  Constitutional: Negative for unusual diaphoresis or night sweats HENT: Negative for ringing in ear or discharge Eyes: Negative for double vision or worsening visual disturbance.  Respiratory: Negative for choking and stridor.   Gastrointestinal: Negative for vomiting or other signifcant bowel change Genitourinary: Negative for hematuria or change in urine volume.  Musculoskeletal: Negative for other MSK pain or swelling Skin: Negative for color change and worsening wound.  Neurological: Negative for tremors and numbness other than noted  Psychiatric/Behavioral: Negative for decreased concentration or agitation other than above       Objective:   Physical Exam BP 154/96 mmHg  Pulse 72  Temp(Src) 98.1 F (36.7 C) (Oral)  Ht  (1.727 m)  Wt 164 lb (74.39 kg)  BMI 24.94 kg/m2  SpO2 99% VS noted,  Constitutional: Pt appears in no significant distress HENT: Head: NCAT.  Right Ear: External ear normal.  Left Ear: External ear normal.  Eyes: . Pupils are equal, round, and reactive to light. Conjunctivae and EOM are normal Neck: Normal range of motion. Neck supple.  Cardiovascular: Normal rate and regular rhythm.   Pulmonary/Chest: Effort normal and breath sounds without rales or wheezing.  Abd:  Soft, NT, ND, + BS, no flank or groin tender or swelling or rash Neurological: Pt is alert. Not confused , motor grossly intact Skin: Skin is warm. No rash, no LE edema Psychiatric: Pt behavior is normal. No agitation.     Assessment & Plan:

## 2015-01-14 NOTE — Assessment & Plan Note (Signed)
D/w pt , BP uncontrolled,  urged compliance with meds, to restart avapro BP Readings from Last 3 Encounters:  01/14/15 154/96  07/13/14 126/82  10/29/13 132/88

## 2015-01-14 NOTE — Assessment & Plan Note (Signed)
Exam benign, but with pain, and recent abnormal urine by report with crystals, cant r/o renal stone, for CT stat today if possible - stone protocol, to consider urology referral if abnormal or with obstruction; also for UA f/u today, noted pt afeb

## 2015-01-19 ENCOUNTER — Encounter: Payer: Self-pay | Admitting: Internal Medicine

## 2015-01-19 ENCOUNTER — Other Ambulatory Visit: Payer: Self-pay | Admitting: Internal Medicine

## 2015-01-19 DIAGNOSIS — E039 Hypothyroidism, unspecified: Secondary | ICD-10-CM

## 2015-01-19 HISTORY — DX: Hypothyroidism, unspecified: E03.9

## 2015-01-19 MED ORDER — LEVOTHYROXINE SODIUM 50 MCG PO TABS: 50.0000 ug | ORAL_TABLET | Freq: Every day | ORAL | Status: DC

## 2015-02-07 ENCOUNTER — Other Ambulatory Visit: Payer: Self-pay | Admitting: Family

## 2015-02-08 NOTE — Telephone Encounter (Signed)
Done hardcopy to Dahlia  

## 2015-02-08 NOTE — Telephone Encounter (Signed)
Rx faxed to pharmacy  

## 2015-02-10 ENCOUNTER — Other Ambulatory Visit: Payer: Self-pay | Admitting: Internal Medicine

## 2015-02-16 ENCOUNTER — Ambulatory Visit (INDEPENDENT_AMBULATORY_CARE_PROVIDER_SITE_OTHER): Payer: BLUE CROSS/BLUE SHIELD | Admitting: Internal Medicine

## 2015-02-16 ENCOUNTER — Encounter: Payer: Self-pay | Admitting: Internal Medicine

## 2015-02-16 ENCOUNTER — Other Ambulatory Visit (INDEPENDENT_AMBULATORY_CARE_PROVIDER_SITE_OTHER): Payer: BLUE CROSS/BLUE SHIELD

## 2015-02-16 VITALS — BP 132/80 | HR 72 | Temp 97.9°F | Wt 164.0 lb

## 2015-02-16 DIAGNOSIS — E039 Hypothyroidism, unspecified: Secondary | ICD-10-CM | POA: Diagnosis not present

## 2015-02-16 NOTE — Progress Notes (Signed)
   Subjective:    Patient ID: Penny Yates, female    DOB: 04/21/1956, 59 y.o.   MRN: [id number]  HPI  Pt inadvertently made OV when I had recently suggested lab only.  Pt exam not done, Ok for labs.     Review of Systems     Objective:   Physical Exam        Assessment & Plan:

## 2015-02-17 LAB — TSH: TSH: 3.11 u[IU]/mL (ref 0.35–4.50)

## 2015-02-17 LAB — T4, FREE: Free T4: 1.02 ng/dL (ref 0.60–1.60)

## 2015-02-20 NOTE — Assessment & Plan Note (Signed)
Ok for f/u labs, no OV needed

## 2015-02-20 NOTE — Patient Instructions (Signed)
OK for labs only  No charge today

## 2015-03-07 ENCOUNTER — Other Ambulatory Visit: Payer: Self-pay

## 2015-03-07 ENCOUNTER — Other Ambulatory Visit: Payer: Self-pay | Admitting: Internal Medicine

## 2015-03-07 MED ORDER — ATORVASTATIN CALCIUM 10 MG PO TABS
ORAL_TABLET | ORAL | Status: DC
Start: 2015-03-07 — End: 2015-07-13

## 2015-04-28 ENCOUNTER — Other Ambulatory Visit: Payer: Self-pay | Admitting: Internal Medicine

## 2015-06-06 ENCOUNTER — Other Ambulatory Visit: Payer: Self-pay | Admitting: Internal Medicine

## 2015-06-06 NOTE — Telephone Encounter (Signed)
Done hardcopy to Corinne  

## 2015-06-07 NOTE — Telephone Encounter (Signed)
RX faxed to [facility]

## 2015-06-28 ENCOUNTER — Encounter: Payer: Self-pay | Admitting: Internal Medicine

## 2015-06-28 ENCOUNTER — Ambulatory Visit (INDEPENDENT_AMBULATORY_CARE_PROVIDER_SITE_OTHER): Payer: BLUE CROSS/BLUE SHIELD | Admitting: Internal Medicine

## 2015-06-28 ENCOUNTER — Other Ambulatory Visit (INDEPENDENT_AMBULATORY_CARE_PROVIDER_SITE_OTHER): Payer: BLUE CROSS/BLUE SHIELD

## 2015-06-28 VITALS — BP 138/80 | HR 76 | Temp 98.9°F | Resp 20 | Wt 158.0 lb

## 2015-06-28 DIAGNOSIS — R51 Headache: Secondary | ICD-10-CM | POA: Diagnosis not present

## 2015-06-28 DIAGNOSIS — Z23 Encounter for immunization: Secondary | ICD-10-CM | POA: Diagnosis not present

## 2015-06-28 DIAGNOSIS — E119 Type 2 diabetes mellitus without complications: Secondary | ICD-10-CM

## 2015-06-28 DIAGNOSIS — M5441 Lumbago with sciatica, right side: Secondary | ICD-10-CM

## 2015-06-28 DIAGNOSIS — Z0000 Encounter for general adult medical examination without abnormal findings: Secondary | ICD-10-CM | POA: Diagnosis not present

## 2015-06-28 DIAGNOSIS — R519 Headache, unspecified: Secondary | ICD-10-CM

## 2015-06-28 DIAGNOSIS — Z0001 Encounter for general adult medical examination with abnormal findings: Secondary | ICD-10-CM | POA: Diagnosis not present

## 2015-06-28 DIAGNOSIS — I10 Essential (primary) hypertension: Secondary | ICD-10-CM | POA: Diagnosis not present

## 2015-06-28 DIAGNOSIS — E039 Hypothyroidism, unspecified: Secondary | ICD-10-CM

## 2015-06-28 DIAGNOSIS — G8929 Other chronic pain: Secondary | ICD-10-CM

## 2015-06-28 LAB — CBC WITH DIFFERENTIAL/PLATELET
BASOS PCT: 0.7 % (ref 0.0–3.0)
Basophils Absolute: 0.1 10*3/uL (ref 0.0–0.1)
EOS ABS: 0.1 10*3/uL (ref 0.0–0.7)
EOS PCT: 1.6 % (ref 0.0–5.0)
HEMATOCRIT: 41.1 % (ref 36.0–46.0)
Hemoglobin: 13.9 g/dL (ref 12.0–15.0)
Lymphs Abs: 4.6 10*3/uL — ABNORMAL HIGH (ref 0.7–4.0)
MCHC: 33.9 g/dL (ref 30.0–36.0)
MCV: 83.7 fl (ref 78.0–100.0)
Monocytes Absolute: 0.5 10*3/uL (ref 0.1–1.0)
Monocytes Relative: 6.8 % (ref 3.0–12.0)
NEUTROS ABS: 2.3 10*3/uL (ref 1.4–7.7)
Neutrophils Relative %: 30.7 % — ABNORMAL LOW (ref 43.0–77.0)
PLATELETS: 258 10*3/uL (ref 150.0–400.0)
RBC: 4.92 Mil/uL (ref 3.87–5.11)
RDW: 15.7 % — AB (ref 11.5–15.5)
WBC: 7.6 10*3/uL (ref 4.0–10.5)

## 2015-06-28 LAB — BASIC METABOLIC PANEL
BUN: 16 mg/dL (ref 6–23)
CALCIUM: 10.7 mg/dL — AB (ref 8.4–10.5)
CO2: 35 meq/L — AB (ref 19–32)
Chloride: 99 mEq/L (ref 96–112)
Creatinine, Ser: 1 mg/dL (ref 0.40–1.20)
GFR: 72.33 mL/min (ref >60.00)
GLUCOSE: 95 mg/dL (ref 70–99)
POTASSIUM: 4.1 meq/L (ref 3.5–5.1)
SODIUM: 144 meq/L (ref 135–145)

## 2015-06-28 LAB — URINALYSIS, ROUTINE W REFLEX MICROSCOPIC
BILIRUBIN URINE: NEGATIVE
HGB URINE DIPSTICK: NEGATIVE
Ketones, ur: NEGATIVE
Nitrite: NEGATIVE
RBC / HPF: NONE SEEN (ref 0–?)
Specific Gravity, Urine: 1.01 (ref 1.000–1.030)
Total Protein, Urine: NEGATIVE
Urine Glucose: NEGATIVE
Urobilinogen, UA: 0.2 (ref 0.0–1.0)
pH: 5.5 (ref 5.0–8.0)

## 2015-06-28 LAB — HEPATIC FUNCTION PANEL
ALBUMIN: 4.2 g/dL (ref 3.5–5.2)
ALK PHOS: 98 U/L (ref 39–117)
ALT: 14 U/L (ref 0–35)
AST: 17 U/L (ref 0–37)
BILIRUBIN DIRECT: 0.1 mg/dL (ref 0.0–0.3)
TOTAL PROTEIN: 7.8 g/dL (ref 6.0–8.3)
Total Bilirubin: 0.5 mg/dL (ref 0.2–1.2)

## 2015-06-28 LAB — LIPID PANEL
CHOLESTEROL: 188 mg/dL (ref 0–200)
HDL: 83.1 mg/dL (ref >39.00)
LDL Cholesterol: 90 mg/dL (ref 0–99)
NONHDL: 105.14
Total CHOL/HDL Ratio: 2
Triglycerides: 78 mg/dL (ref 0.0–149.0)
VLDL: 15.6 mg/dL (ref 0.0–40.0)

## 2015-06-28 LAB — MICROALBUMIN / CREATININE URINE RATIO
CREATININE, U: 54.3 mg/dL
MICROALB/CREAT RATIO: 1.3 mg/g (ref 0.0–30.0)

## 2015-06-28 LAB — SEDIMENTATION RATE: SED RATE: 38 mm/h — AB (ref 0–22)

## 2015-06-28 LAB — TSH: TSH: 6.31 u[IU]/mL — ABNORMAL HIGH (ref 0.35–4.50)

## 2015-06-28 LAB — HEMOGLOBIN A1C: HEMOGLOBIN A1C: 5.8 % (ref 4.6–6.5)

## 2015-06-28 NOTE — Patient Instructions (Addendum)
You had the flu shot today  Please return in 2 weeks for the new Prevnar pneumonia shot (with a Nurse Visit appt)   Please continue all other medications as before, and refills have been done if requested.  Please have the pharmacy call with any other refills you may need.  Please continue your efforts at being more active, low cholesterol diet, and weight control.  You are otherwise up to date with prevention measures today.  Please keep your appointments with your specialists as you may have planned  You will be contacted regarding the referral for: Dr Katrinka BlazingSmith in this office regarding the back pain  Please go to the LAB in the Basement (turn left off the elevator) for the tests to be done today  You will be contacted by phone if any changes need to be made immediately.  Otherwise, you will receive a letter about your results with an explanation, but please check with MyChart first.  Please remember to sign up for MyChart if you have not done so, as this will be important to you in the future with finding out test results, communicating by private email, and scheduling acute appointments online when needed.  Please return in 6 months, or sooner if needed, with Lab testing done 3-5 days before

## 2015-06-28 NOTE — Progress Notes (Signed)
Pre visit review using our clinic review tool, if applicable. No additional management support is needed unless otherwise documented below in the visit note. 

## 2015-06-28 NOTE — Progress Notes (Signed)
Subjective:    Patient ID: Penny Yates, female    DOB: 10/15/1955, 60 y.o.   MRN: [id number]  HPI   Here for wellness and f/u;  Overall doing ok;  Pt denies Chest pain, worsening SOB, DOE, wheezing, orthopnea, PND, worsening LE edema, palpitations, dizziness or syncope.  Pt denies neurological change such as new headache, facial or extremity weakness.  Pt denies polydipsia, polyuria, or low sugar symptoms. Pt states overall good compliance with treatment and medications, good tolerability, and has been trying to follow appropriate diet.  Pt denies worsening depressive symptoms, suicidal ideation or panic. No fever, night sweats, wt loss, loss of appetite, or other constitutional symptoms.  Pt states good ability with ADL's, has low fall risk, home safety reviewed and adequate, no other significant changes in hearing or vision, and only occasionally active with exercise.   Also Here with c/o 1 wk marked right temple area HA with some 'vein swelling" that kept her in bed for 1 wk, but Pt denies new neurological symptoms such as new facial or extremity weakness or numbness. Fortunately all pain resolved spontaneously yesterday, and none since then.  Pt also continues to have recurring right LBP with pain to the right post thigh,  bUt without change in severity, bowel or bladder change, fever, wt loss,  worsening LE pain/numbness/weakness, gait change or falls.  Does have chronic right leg longer, but still pain despite left shoe lift. Denies hyper or hypo thyroid symptoms such as voice, skin or hair change, still wondering if thyroid med adequate, just never feels hungry, has recurring hot spells she cant explain.  Pt denies fever,  night sweats, loss of appetite, or other constitutional symptoms, but had lost several lbs recently.  Denies worsening depressive symptoms, suicidal ideation, or panic, though "my family might say I'm depressed."  Hard to find a new job at her age, frustrated over not finding a  new job. Wt Readings from Last 3 Encounters:  06/28/15 158 lb (71.668 kg)  02/16/15 164 lb (74.39 kg)  01/14/15 164 lb (74.39 kg)  Cont's to grind teeth at night, wears mouth guard at night. Past Medical History  Diagnosis Date  . Anemia   . History of colonic polyps     multiple of succeeding years 96, 97, 98, and 99  . Hyperlipidemia   . Hypertension   . Chronic venous insufficiency     LE's  . Low back pain   . Sciatica   . Leg length discrepancy     right leg longer  . Hyperthyroidism     s/p rad Iodine-ellison  . Sacroiliitis (HCC)     history of  . Depression   . Diabetes mellitus without complication (HCC)   . Lymphedema     left arm, both legs; primary lymphedema  . Fracture of shaft of right radius 01/06/2013  . Hypothyroidism 01/19/2015   Past Surgical History  Procedure Laterality Date  . Tubal ligation    . 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 Revision of scar tissue rectus muscle  11/08    right knee  . Eye surgery      both eyes-muscle repair  . Open reduction internal fixation (orif) distal radial fracture Right 01/06/2013    Procedure: RIGHT OPEN REDUCTION INTERNAL FIXATION (ORIF) DISTAL ARTICULATING RADIAL FRACTURE ;  Surgeon: Eulas PostJoshua P Landau, MD;  Location: [facility];  Service: Orthopedics;  Laterality: Right;    reports that she  has never smoked. She has never used smokeless tobacco. She reports that she does not drink alcohol or use illicit drugs. family history includes Cancer in her mother; Colon cancer (age of onset: 43) in her mother; Diabetes in her mother; Heart disease in her maternal grandmother. There is no history of Thyroid disease, Esophageal cancer, Rectal cancer, or Stomach cancer. Allergies  Allergen Reactions  . Ace Inhibitors Cough  . Lisinopril     Cough   Current Outpatient Prescriptions on File Prior to Visit  Medication Sig Dispense Refill  . aspirin 81 MG tablet Take 81 mg by mouth daily.      Marland Kitchen  atorvastatin (LIPITOR) 10 MG tablet TAKE 1 TABLET BY MOUTH EVERY DAY 90 tablet 2  . atorvastatin (LIPITOR) 10 MG tablet TAKE 1 TABLET (10 MG TOTAL) BY MOUTH DAILY. 90 tablet 2  . Biotin 5000 MCG CAPS Take by mouth.      . Calcium Carbonate-Vitamin D 300-100 MG-UNIT CAPS Take by mouth. Take one daily      . diltiazem (CARDIZEM CD) 360 MG 24 hr capsule TAKE 1 CAPSULE (360 MG TOTAL) BY MOUTH DAILY. 90 capsule 1  . Dorzolamide HCl-Timolol Mal PF 22.3-6.8 MG/ML SOLN Apply 1 drop to eye 2 (two) times daily. 1 drop in each eye twice a day.    . fish oil-omega-3 fatty acids 1000 MG capsule Take 1 capsule by mouth daily.     . furosemide (LASIX) 40 MG tablet TAKE 1 TABLET BY MOUTH EVERY DAY 90 tablet 2  . guanFACINE (TENEX) 1 MG tablet TAKE 1 TABLET BY MOUTH AT BEDTIME 30 tablet 5  . irbesartan (AVAPRO) 300 MG tablet Take 1 tablet (300 mg total) by mouth daily. 90 tablet 3  . KLOR-CON M20 20 MEQ tablet TAKE 1 TABLET (20 MEQ TOTAL) BY MOUTH DAILY. 90 tablet 2  . levothyroxine (SYNTHROID, LEVOTHROID) 50 MCG tablet Take 1 tablet (50 mcg total) by mouth daily. 90 tablet 3  . Multiple Vitamin (MULTIVITAMIN) capsule Take 1 capsule by mouth daily.      . traMADol (ULTRAM-ER) 200 MG 24 hr tablet TAKE 1 TABLET BY MOUTH EVERY DAY AS NEEDED FOR PAIN 30 tablet 2  . venlafaxine XR (EFFEXOR-XR) 37.5 MG 24 hr capsule TAKE 1 CAPSULE (37.5 MG TOTAL) BY MOUTH DAILY. 90 capsule 1   No current facility-administered medications on file prior to visit.   Review of Systems Constitutional: Negative for increased diaphoresis, other activity, appetite or siginficant weight change other than noted HENT: Negative for worsening hearing loss, ear pain, facial swelling, mouth sores and neck stiffness.   Eyes: Negative for other worsening pain, redness or visual disturbance.  Respiratory: Negative for shortness of breath and wheezing  Cardiovascular: Negative for chest pain and palpitations.  Gastrointestinal: Negative for  diarrhea, blood in stool, abdominal distention or other pain Genitourinary: Negative for hematuria, flank pain or change in urine volume.  Musculoskeletal: Negative for myalgias or other joint complaints.  Skin: Negative for color change and wound or drainage.  Neurological: Negative for syncope and numbness. other than noted Hematological: Negative for adenopathy. or other swelling Psychiatric/Behavioral: Negative for hallucinations, SI, self-injury, decreased concentration or other worsening agitation.      Objective:   Physical Exam BP 138/80 mmHg  Pulse 76  Temp(Src) 98.9 F (37.2 C) (Oral)  Resp 20  Wt 158 lb (71.668 kg)  SpO2 97% VS noted,  Constitutional: Pt is oriented to person, place, and time. Appears well-developed and well-nourished, in no significant  distress Head: Normocephalic and atraumatic. No right temple swelling or tender Right Ear: External ear normal.  Left Ear: External ear normal.  Nose: Nose normal.  Mouth/Throat: Oropharynx is clear and moist.  Eyes: Conjunctivae and EOM are normal. Pupils are equal, round, and reactive to light.  Neck: Normal range of motion. Neck supple. No JVD present. No tracheal deviation present or significant neck LA or mass Cardiovascular: Normal rate, regular rhythm, normal heart sounds and intact distal pulses.   Pulmonary/Chest: Effort normal and breath sounds without rales or wheezing  Abdominal: Soft. Bowel sounds are normal. NT. No HSM  Musculoskeletal: Normal range of motion. Exhibits no edema.  Lymphadenopathy:  Has no cervical adenopathy.  Neurological: Pt is alert and oriented to person, place, and time. Pt has normal reflexes. No cranial nerve deficit. Motor 5/5 intact  Spine nontender Skin: Skin is warm and dry. No rash noted.  Psychiatric:  Has normal mood and affect. Behavior is normal.     Assessment & Plan:

## 2015-06-28 NOTE — Assessment & Plan Note (Signed)

## 2015-06-28 NOTE — Assessment & Plan Note (Signed)
Etiology unclear, suspect stress or depression related possible migraine, now resolved, neuro ok, will cont to monitor, check esr for less likely giant cell arteritis

## 2015-06-28 NOTE — Assessment & Plan Note (Signed)
stable overall by history and exam, recent data reviewed with pt, and pt to continue medical treatment as before,  to f/u any worsening symptoms or concerns Lab Results  Component Value Date   HGBA1C 5.6 01/14/2015    

## 2015-06-28 NOTE — Assessment & Plan Note (Signed)
stable overall by history and exam, recent data reviewed with pt, and pt to continue medical treatment as before,  to f/u any worsening symptoms or concerns Lab Results  Component Value Date   TSH 3.11 02/16/2015

## 2015-06-28 NOTE — Assessment & Plan Note (Signed)
stable overall by history and exam, recent data reviewed with pt, and pt to continue medical treatment as before,  to f/u any worsening symptoms or concerns BP Readings from Last 3 Encounters:  06/28/15 138/80  02/16/15 132/80  01/14/15 154/96

## 2015-06-28 NOTE — Addendum Note (Signed)
Addended by: Etheleen MayhewOX, Emiliano Welshans C on: 06/28/2015 02:55 PM   Modules accepted: Orders

## 2015-06-28 NOTE — Assessment & Plan Note (Signed)
Chronic recurrent stable, but pt requests second opinion, will refer to [facility] in this office

## 2015-06-29 LAB — HM DIABETES EYE EXAM

## 2015-06-29 LAB — HEPATITIS C ANTIBODY: HCV Ab: NEGATIVE

## 2015-06-30 ENCOUNTER — Encounter: Payer: Self-pay | Admitting: Internal Medicine

## 2015-07-05 ENCOUNTER — Other Ambulatory Visit: Payer: Self-pay | Admitting: Internal Medicine

## 2015-07-06 DIAGNOSIS — Z23 Encounter for immunization: Secondary | ICD-10-CM

## 2015-07-13 ENCOUNTER — Encounter: Payer: Self-pay | Admitting: Family Medicine

## 2015-07-13 ENCOUNTER — Ambulatory Visit (INDEPENDENT_AMBULATORY_CARE_PROVIDER_SITE_OTHER): Payer: BLUE CROSS/BLUE SHIELD | Admitting: Family Medicine

## 2015-07-13 VITALS — BP 136/82 | HR 83 | Ht 68.0 in | Wt 161.0 lb

## 2015-07-13 DIAGNOSIS — T8484XA Pain due to internal orthopedic prosthetic devices, implants and grafts, initial encounter: Secondary | ICD-10-CM | POA: Diagnosis not present

## 2015-07-13 DIAGNOSIS — Z96659 Presence of unspecified artificial knee joint: Secondary | ICD-10-CM

## 2015-07-13 DIAGNOSIS — M2170 Unequal limb length (acquired), unspecified site: Secondary | ICD-10-CM

## 2015-07-13 MED ORDER — MONTELUKAST SODIUM 10 MG PO TABS: 10.0000 mg | ORAL_TABLET | Freq: Every day | ORAL | Status: DC

## 2015-07-13 NOTE — Progress Notes (Signed)
[facility] [address] [place], [place] [address] Phone: [phone] Subjective:    I'm seeing this patient by the request  of:  Oliver Barre, MD   CC: right sided back pain   [id number] Penny Yates is a 60 y.o. female coming in with complaint of right-sided back pain. Been going on for proximal wing 10 years. Patient is not recovering true injury. Patient did have a knee replacement around this time. Patient states continues to have trouble with the knee. Patient states that when she walks she seems to do relatively well but was standing she has significant worse discomfort. Patient states that it can be severe enough that stops her from different activities. Denies any radiation down the leg but states that she can have right-sided buttocks pain. Patient has tried multiple different things such as switching shoes, formal physical therapy, and icing regimen. Has seen many different providers and has not had any significant relief. Patient has been taking tramadol fairly regularly with no significant relief. No pain at night though. Rates the severity of pain when it does occur as 8 out of 10.     Past Medical History  Diagnosis Date  . Anemia   . History of colonic polyps     multiple of succeeding years 96, 97, 98, and 99  . Hyperlipidemia   . Hypertension   . Chronic venous insufficiency     LE's  . Low back pain   . Sciatica   . Leg length discrepancy     right leg longer  . Hyperthyroidism     s/p rad Iodine-ellison  . Sacroiliitis (HCC)     history of  . Depression   . Diabetes mellitus without complication (HCC)   . Lymphedema     left arm, both legs; primary lymphedema  . Fracture of shaft of right radius 01/06/2013  . Hypothyroidism 01/19/2015   Past Surgical History  Procedure Laterality Date  . Tubal ligation    . 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 Revision of scar tissue rectus muscle  11/08    right knee  .  Eye surgery      both eyes-muscle repair  . Open reduction internal fixation (orif) distal radial fracture Right 01/06/2013    Procedure: RIGHT OPEN REDUCTION INTERNAL FIXATION (ORIF) DISTAL ARTICULATING RADIAL FRACTURE ;  Surgeon: Eulas Post, MD;  Location: [facility];  Service: Orthopedics;  Laterality: Right;   Social History   Social History  . Marital Status: Married    Spouse Name: N/A  . Number of Children: N/A  . Years of Education: N/A   Social History Main Topics  . Smoking status: Never Smoker   . Smokeless tobacco: Never Used  . Alcohol Use: No  . Drug Use: No  . Sexual Activity: Not Asked   Other Topics Concern  . None   Social History Narrative   Moved to [place] in Jan 2009. Married with children. 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 Looking for a new job.   Allergies  Allergen Reactions  . Ace Inhibitors Cough  . Lisinopril     Cough   Family History  Problem Relation Age of Onset  . Colon cancer Mother 22  . Diabetes Mother   . Cancer Mother     Colon Cancer  . Diabetes  mother and several others in family  . Stroke    . Kidney disease    . Heart disease Maternal Grandmother   . Thyroid disease Neg Hx   . Esophageal cancer Neg Hx   . Rectal cancer Neg Hx   . Stomach cancer Neg Hx     Past medical history, social, surgical and family history all reviewed in electronic medical record.  No pertanent information unless stated regarding to the chief complaint.   Review of Systems: No headache, visual changes, nausea, vomiting, diarrhea, constipation, dizziness, abdominal pain, skin rash, fevers, chills, night sweats, weight loss, swollen lymph nodes, body aches, joint swelling, muscle aches, chest pain, shortness of breath, mood changes.   Objective Blood pressure 136/82, pulse 83, height 5\' 8"  (1.727 m), weight 161 lb (73.029 kg), SpO2 99 %.  General: No  apparent distress alert and oriented x3 mood and affect normal, dressed appropriately.  HEENT: Pupils equal, extraocular movements intact  Respiratory: Patient's speak in full sentences and does not appear short of breath  Cardiovascular: No lower extremity edema, non tender, no erythema  Skin: Warm dry intact with no signs of infection or rash on extremities or on axial skeleton.  Abdomen: Soft nontender  Neuro: Cranial nerves II through XII are intact, neurovascularly intact in all extremities with 2+ DTRs and 2+ pulses.  Lymph: No lymphadenopathy of posterior or anterior cervical chain or axillae bilaterally.  Gait antalgic gait but good balance and coordination. MSK:  Non tender with full range of motion and good stability and symmetric strength and tone of shoulders, elbows, wrist, hip, knee and ankles bilaterally. Patient is a replacement of her knee.patient's right knee replacement does have some mild clunking with range of motion. Patient does have an effusion of the knee as well. Leg length discrepancy noted with patient's right leg eighth of an inch shorter. Back Exam:  Inspection: Unremarkable  Motion: Flexion 35 deg, Extension 20 deg, Side Bending to 35 deg bilaterally,  Rotation to 30 deg bilaterally  SLR laying: Negative  XSLR laying: Negative  Palpable tenderness: tender at the right paraspinal musculature as well as the right piriformis. FABER: positive right side. Sensory change: Gross sensation intact to all lumbar and sacral dermatomes.  Reflexes: 2+ at both patellar tendons, 2+ at achilles tendons, Babinski's downgoing.  Strength at foot  Plantar-flexion: 5/5 Dorsi-flexion: 5/5 Eversion: 5/5 Inversion: 5/5  Leg strength  Quad: 5/5 Hamstring: 5/5 Hip flexor: 5/5 Hip abductors: 3+/5      Impression and Recommendations:     This case required medical decision making of moderate complexity.      Note: This dictation was prepared with Dragon dictation along with  smaller phrase technology. Any transcriptional errors that result from this process are unintentional.

## 2015-07-13 NOTE — Assessment & Plan Note (Signed)
Patient does have low back pain. I do think that this is likely multifactorial and more secondary to biomechanics. We discussed icing regimen, home exercises, we discussed patient does have a leg length discrepancy and this could also be contribute in. We discussed which activities to do an which was potentially avoid. Patient will try and changes in shoes. Patient and will come back and see me again in 4 weeks. At that time if having worsening pain we'll consider repeat x-rays, formal physical therapy, as well as laboratory workup.

## 2015-07-13 NOTE — Patient Instructions (Addendum)
Nice to meet you  Would try colace 100mg  daily with miralax 17grams daily to help with bowel habits.  Ice 20 minutes 2 times daily. Usually after activity and before bed. Exercises 3 times a week. Any of the ones you have had from PT will be great  Heel lift either 1/16 or 1/8 inch would be great.  [facility], [facility] etc or online at [employer] pennsaid pinkie amount topically 2 times daily as needed.  If you take the tramadol take it with a tylenol 325-650mg .  This will make it last longer Singulair nightly Vitamins to add Vitamin D 1000-2000 IU daily  Continue the turmeric  Tart cherry extract any dose at night Iron 65mg  with 500mg  of vitamin c 3 times a week  And if constipation consider colace with it as well.  See me again in 4 weeks to make sure we are making progress.

## 2015-07-13 NOTE — Assessment & Plan Note (Signed)
Patient does have pain as well as swelling of this knee. Patient does have history of lymphedema. I believe the patient could be having more of an allergic reaction. She has had this difficulty for 11 years. Patient did have a bone scan that did show loosening. Patient did have removal scar tissue but they did not do any type or revision. If continuing have trouble possible repeat bone scan or CT scan may be necessary. Follow-up in 4 weeks.

## 2015-07-13 NOTE — Progress Notes (Signed)
Pre visit review using our clinic review tool, if applicable. No additional management support is needed unless otherwise documented below in the visit note. 

## 2015-07-29 ENCOUNTER — Other Ambulatory Visit: Payer: Self-pay | Admitting: Internal Medicine

## 2015-08-05 ENCOUNTER — Other Ambulatory Visit: Payer: Self-pay | Admitting: Internal Medicine

## 2015-08-09 ENCOUNTER — Encounter: Payer: Self-pay | Admitting: Family Medicine

## 2015-08-09 ENCOUNTER — Ambulatory Visit (INDEPENDENT_AMBULATORY_CARE_PROVIDER_SITE_OTHER): Payer: BLUE CROSS/BLUE SHIELD | Admitting: Family Medicine

## 2015-08-09 VITALS — BP 128/82 | HR 76 | Wt 162.0 lb

## 2015-08-09 DIAGNOSIS — M5441 Lumbago with sciatica, right side: Secondary | ICD-10-CM

## 2015-08-09 DIAGNOSIS — G8929 Other chronic pain: Secondary | ICD-10-CM | POA: Diagnosis not present

## 2015-08-09 DIAGNOSIS — Z96659 Presence of unspecified artificial knee joint: Secondary | ICD-10-CM

## 2015-08-09 DIAGNOSIS — T8484XD Pain due to internal orthopedic prosthetic devices, implants and grafts, subsequent encounter: Secondary | ICD-10-CM

## 2015-08-09 DIAGNOSIS — M2170 Unequal limb length (acquired), unspecified site: Secondary | ICD-10-CM | POA: Diagnosis not present

## 2015-08-09 NOTE — Assessment & Plan Note (Signed)
Patient given heel lifts. We discussed over-the-counter orthotics that could be just is beneficial.

## 2015-08-09 NOTE — Assessment & Plan Note (Signed)
Improvement with decreased swelling.

## 2015-08-09 NOTE — Assessment & Plan Note (Signed)
Do believe the patient will do very well to conservative therapy. Encourage her to continue home exercises and the core strength. Patient can follow-up as needed.

## 2015-08-09 NOTE — Patient Instructions (Signed)
Good to see you You are doing amazing Ice when you need it Exercises 2-3 times a week  Continue the vitamins Spenco orthotics "total support" online would be great  Do not lace last eye on shoe See me again when you need me

## 2015-08-09 NOTE — Progress Notes (Signed)
Penny Yates D.O. [facility] [address] [place], [place] [address] Phone: [phone] Subjective:    I'm seeing this patient by the request  of:  Oliver BarreJames [name], MD   CC: right sided back pain  F/u   ONG:[id number]:Subjective Penny Yates is a [age] y.o. female coming in with complaint of right-sided back pain. Patient was found to have a leg length discrepancy and was having lower back pain. Patient was given conservative therapy including home exercises, icing protocol, topical anti-inflammatories. Patient states overall she is doing 99% better. Patient did take a trip to [place] where she did a significant amount walking for 5 days and states that her back was feeling significantly better. States that since she's been taking the anti-inflammatory and the over-the-counter vitamins patient has also noticed decreased swelling of the right knee. Overall patient is very happy with the results.      Past Medical History  Diagnosis Date  . Anemia   . History of colonic polyps     multiple of succeeding years 96, 97, 98, and 99  . Hyperlipidemia   . Hypertension   . Chronic venous insufficiency     LE's  . Low back pain   . Sciatica   . Leg length discrepancy     right leg longer  . Hyperthyroidism     s/p rad Iodine-ellison  . Sacroiliitis (HCC)     history of  . Depression   . Diabetes mellitus without complication (HCC)   . Lymphedema     left arm, both legs; primary lymphedema  . Fracture of shaft of right radius 01/06/2013  . Hypothyroidism 01/19/2015   Past Surgical History  Procedure Laterality Date  . Tubal ligation    . 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 Revision of scar tissue rectus muscle  11/08    right knee  . Eye surgery      both eyes-muscle repair  . Open reduction internal fixation (orif) distal radial fracture Right 01/06/2013    Procedure: RIGHT OPEN REDUCTION INTERNAL FIXATION (ORIF) DISTAL ARTICULATING RADIAL FRACTURE ;  Surgeon: Eulas PostJoshua P  Landau, MD;  Location: [facility];  Service: Orthopedics;  Laterality: Right;   Social History   Social History  . Marital Status: Married    Spouse Name: N/A  . Number of Children: N/A  . Years of Education: N/A   Social History Main Topics  . Smoking status: Never Smoker   . Smokeless tobacco: Never Used  . Alcohol Use: No  . Drug Use: No  . Sexual Activity: Not Asked   Other Topics Concern  . None   Social History Narrative   Moved to [place] in Jan 2009. Married with children. 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 Looking for a new job.   Allergies  Allergen Reactions  . Ace Inhibitors Cough  . Lisinopril     Cough   Family History  Problem Relation Age of Onset  . Colon cancer Mother 1454  . Diabetes Mother   . Cancer Mother     Colon Cancer  . Diabetes      mother and several others in family  . Stroke    . Kidney disease    . Heart disease Maternal Grandmother   . Thyroid disease Neg Hx   . Esophageal cancer Neg Hx   . Rectal cancer Neg  Hx   . Stomach cancer Neg Hx     Past medical history, social, surgical and family history all reviewed in electronic medical record.  No pertanent information unless stated regarding to the chief complaint.   Review of Systems: No headache, visual changes, nausea, vomiting, diarrhea, constipation, dizziness, abdominal pain, skin rash, fevers, chills, night sweats, weight loss, swollen lymph nodes, body aches, joint swelling, muscle aches, chest pain, shortness of breath, mood changes.   Objective Blood pressure 128/82, pulse 76, weight 162 lb (73.483 kg).  General: No apparent distress alert and oriented x3 mood and affect normal, dressed appropriately.  HEENT: Pupils equal, extraocular movements intact  Respiratory: Patient's speak in full sentences and does not appear short of breath  Cardiovascular: No lower extremity edema, non tender, no  erythema  Skin: Warm dry intact with no signs of infection or rash on extremities or on axial skeleton.  Abdomen: Soft nontender  Neuro: Cranial nerves II through XII are intact, neurovascularly intact in all extremities with 2+ DTRs and 2+ pulses.  Lymph: No lymphadenopathy of posterior or anterior cervical chain or axillae bilaterally.  Gait antalgic gait but good balance and coordination. MSK:  Non tender with full range of motion and good stability and symmetric strength and tone of shoulders, elbows, wrist, hip, knee and ankles bilaterally. Patient is a replacement of her knee.patient's right knee replacement does have some mild clunking with range of motion. Improvement in effusion that was noted on the right knee by approximately 50%. Leg length discrepancy noted with patient's right leg eighth of an inch shorter. Back Exam:  Inspection: Unremarkable  Motion: Flexion 35 deg, Extension 25 deg, Side Bending to 35 deg bilaterally,  Rotation to 30 deg bilaterally  SLR laying: Negative  XSLR laying: Negative  Palpable tenderness: very minimal tenderness still remaining over the paraspinal musculature FABER: positive right side less severe Sensory change: Gross sensation intact to all lumbar and sacral dermatomes.  Reflexes: 2+ at both patellar tendons, 2+ at achilles tendons, Babinski's downgoing.  Strength at foot  Plantar-flexion: 5/5 Dorsi-flexion: 5/5 Eversion: 5/5 Inversion: 5/5  Leg strength  Quad: 5/5 Hamstring: 5/5 Hip flexor: 5/5 Hip abductors: 4/5  Improvement from previous exam    Impression and Recommendations:     This case required medical decision making of moderate complexity.      Note: This dictation was prepared with Dragon dictation along with smaller phrase technology. Any transcriptional errors that result from this process are unintentional.

## 2015-09-10 ENCOUNTER — Other Ambulatory Visit: Payer: Self-pay | Admitting: Internal Medicine

## 2015-09-26 ENCOUNTER — Other Ambulatory Visit: Payer: Self-pay | Admitting: Internal Medicine

## 2015-09-27 NOTE — Telephone Encounter (Signed)
Please advise as Dr [name] is out of the office.  

## 2015-09-30 ENCOUNTER — Other Ambulatory Visit: Payer: Self-pay | Admitting: Family

## 2015-09-30 ENCOUNTER — Other Ambulatory Visit: Payer: Self-pay | Admitting: Internal Medicine

## 2015-10-02 ENCOUNTER — Encounter: Payer: Self-pay | Admitting: Internal Medicine

## 2015-10-03 ENCOUNTER — Other Ambulatory Visit: Payer: Self-pay | Admitting: Internal Medicine

## 2015-10-04 NOTE — Telephone Encounter (Signed)
Medication refill sent to pharmacy  

## 2015-10-04 NOTE — Telephone Encounter (Signed)
Tramadol er too soon for this note, as it was already refilled earlier today

## 2015-10-04 NOTE — Telephone Encounter (Signed)
Done hardcopy to Corinne  

## 2015-11-12 ENCOUNTER — Other Ambulatory Visit: Payer: Self-pay | Admitting: Internal Medicine

## 2015-12-29 ENCOUNTER — Other Ambulatory Visit (INDEPENDENT_AMBULATORY_CARE_PROVIDER_SITE_OTHER): Payer: BLUE CROSS/BLUE SHIELD

## 2015-12-29 ENCOUNTER — Encounter: Payer: Self-pay | Admitting: Internal Medicine

## 2015-12-29 ENCOUNTER — Ambulatory Visit (INDEPENDENT_AMBULATORY_CARE_PROVIDER_SITE_OTHER): Payer: BLUE CROSS/BLUE SHIELD | Admitting: Internal Medicine

## 2015-12-29 VITALS — BP 132/70 | HR 73 | Temp 98.0°F | Resp 20 | Wt 164.0 lb

## 2015-12-29 DIAGNOSIS — E119 Type 2 diabetes mellitus without complications: Secondary | ICD-10-CM

## 2015-12-29 DIAGNOSIS — I10 Essential (primary) hypertension: Secondary | ICD-10-CM | POA: Diagnosis not present

## 2015-12-29 DIAGNOSIS — Z0001 Encounter for general adult medical examination with abnormal findings: Secondary | ICD-10-CM

## 2015-12-29 DIAGNOSIS — E785 Hyperlipidemia, unspecified: Secondary | ICD-10-CM | POA: Diagnosis not present

## 2015-12-29 DIAGNOSIS — E039 Hypothyroidism, unspecified: Secondary | ICD-10-CM

## 2015-12-29 DIAGNOSIS — R6889 Other general symptoms and signs: Secondary | ICD-10-CM

## 2015-12-29 LAB — T4, FREE: FREE T4: 1.14 ng/dL (ref 0.60–1.60)

## 2015-12-29 LAB — URINALYSIS, ROUTINE W REFLEX MICROSCOPIC
Bilirubin Urine: NEGATIVE
Hgb urine dipstick: NEGATIVE
Leukocytes, UA: NEGATIVE
Nitrite: NEGATIVE
RBC / HPF: NONE SEEN (ref 0–?)
SPECIFIC GRAVITY, URINE: 1.02 (ref 1.000–1.030)
Total Protein, Urine: NEGATIVE
Urine Glucose: NEGATIVE
Urobilinogen, UA: 0.2 (ref 0.0–1.0)
pH: 7 (ref 5.0–8.0)

## 2015-12-29 LAB — BASIC METABOLIC PANEL
BUN: 16 mg/dL (ref 6–23)
CHLORIDE: 104 meq/L (ref 96–112)
CO2: 32 mEq/L (ref 19–32)
Calcium: 9.5 mg/dL (ref 8.4–10.5)
Creatinine, Ser: 0.84 mg/dL (ref 0.40–1.20)
GFR: 88.3 mL/min (ref >60.00)
Glucose, Bld: 95 mg/dL (ref 70–99)
POTASSIUM: 4.4 meq/L (ref 3.5–5.1)
Sodium: 141 mEq/L (ref 135–145)

## 2015-12-29 LAB — HEPATIC FUNCTION PANEL
ALK PHOS: 95 U/L (ref 39–117)
ALT: 14 U/L (ref 0–35)
AST: 16 U/L (ref 0–37)
Albumin: 4 g/dL (ref 3.5–5.2)
BILIRUBIN DIRECT: 0.1 mg/dL (ref 0.0–0.3)
BILIRUBIN TOTAL: 0.5 mg/dL (ref 0.2–1.2)
Total Protein: 7.4 g/dL (ref 6.0–8.3)

## 2015-12-29 LAB — LIPID PANEL
CHOL/HDL RATIO: 2
Cholesterol: 191 mg/dL (ref 0–200)
HDL: 89.7 mg/dL (ref >39.00)
LDL Cholesterol: 91 mg/dL (ref 0–99)
NonHDL: 101.61
TRIGLYCERIDES: 55 mg/dL (ref 0.0–149.0)
VLDL: 11 mg/dL (ref 0.0–40.0)

## 2015-12-29 LAB — HEMOGLOBIN A1C: HEMOGLOBIN A1C: 5.7 % (ref 4.6–6.5)

## 2015-12-29 NOTE — Progress Notes (Signed)
Pre visit review using our clinic review tool, if applicable. No additional management support is needed unless otherwise documented below in the visit note. 

## 2015-12-29 NOTE — Progress Notes (Signed)
Subjective:    Patient ID: Penny Yates, female    DOB: 18-Feb-1956, 60 y.o.   MRN: [id number]  HPI  Here to f/u; overall doing ok,  Pt denies chest pain, increasing sob or doe, wheezing, orthopnea, PND, increased LE swelling, palpitations, dizziness or syncope.  Pt denies new neurological symptoms such as new headache, or facial or extremity weakness or numbness.  Pt denies polydipsia, polyuria   Pt denies new neurological symptoms such as new headache, or facial or extremity weakness or numbness.   Pt states overall good compliance with meds, mostly trying to follow appropriate diet,  Gained 3 lbs with some dietary indiscretion per pt.  Pt denies polydipsia, polyuria, or low sugar symptoms such as weakness or confusion improved with po intake.  Pt states overall good compliance with meds, trying to follow lower cholesterol, diabetic diet, wt overall stable but little exercise however.     Wt Readings from Last 3 Encounters:  12/29/15 164 lb (74.4 kg)  08/09/15 162 lb (73.5 kg)  07/13/15 161 lb (73 kg)  Denies worsening depressive symptoms, suicidal ideation, or panic; has ongoing anxiety, not increased recently.  Stopped taking the effexor ER as she became concerned it might make her glaucoma worse, was started by GYN. No longer having hot flashes, does not need other med for now such as zoloft.  Denies hyper or hypo thyroid symptoms such as voice, skin or hair change. Past Medical History:  Diagnosis Date  . Anemia   . Chronic venous insufficiency    LE's  . Depression   . Diabetes mellitus without complication (HCC)   . Fracture of shaft of right radius 01/06/2013  . History of colonic polyps    multiple of succeeding years 96, 97, 98, and 99  . Hyperlipidemia   . Hypertension   . Hyperthyroidism    s/p rad Iodine-ellison  . Hypothyroidism 01/19/2015  . Leg length discrepancy    right leg longer  . Low back pain   . Lymphedema    left arm, both legs; primary lymphedema  .  Sacroiliitis (HCC)    history of  . Sciatica    Past Surgical History:  Procedure Laterality Date  . EYE SURGERY     both eyes-muscle repair  . OPEN REDUCTION INTERNAL FIXATION (ORIF) DISTAL RADIAL FRACTURE Right 01/06/2013   Procedure: RIGHT OPEN REDUCTION INTERNAL FIXATION (ORIF) DISTAL ARTICULATING RADIAL FRACTURE ;  Surgeon: Eulas Post, MD;  Location: [facility];  Service: Orthopedics;  Laterality: Right;  . 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 REVISION OF SCAR TISSUE RECTUS MUSCLE  11/08   right knee  . TUBAL LIGATION      reports that she has never smoked. She has never used smokeless tobacco. She reports that she does not drink alcohol or use drugs. family history includes Cancer in her mother; Colon cancer (age of onset: 7) in her mother; Diabetes in her mother; Heart disease in her maternal grandmother. Allergies  Allergen Reactions  . Ace Inhibitors Cough  . Lisinopril     Cough   Current Outpatient Prescriptions on File Prior to Visit  Medication Sig Dispense Refill  . aspirin 81 MG tablet Take 81 mg by mouth daily.      Marland Kitchen atorvastatin (LIPITOR) 10 MG tablet TAKE 1 TABLET BY MOUTH EVERY DAY 90 tablet 2  . Biotin 5000 MCG CAPS Take by mouth.      . Calcium Carbonate-Vitamin D 300-100  MG-UNIT CAPS Take by mouth. Take one daily      . diltiazem (CARDIZEM CD) 360 MG 24 hr capsule TAKE 1 CAPSULE (360 MG TOTAL) BY MOUTH DAILY. 90 capsule 1  . Dorzolamide HCl-Timolol Mal PF 22.3-6.8 MG/ML SOLN Apply 1 drop to eye 2 (two) times daily. 1 drop in each eye twice a day.    . fish oil-omega-3 fatty acids 1000 MG capsule Take 1 capsule by mouth daily.     . furosemide (LASIX) 40 MG tablet TAKE 1 TABLET BY MOUTH EVERY DAY 90 tablet 2  . guanFACINE (TENEX) 1 MG tablet TAKE 1 TABLET BY MOUTH AT BEDTIME 30 tablet 4  . irbesartan (AVAPRO) 300 MG tablet TAKE 1 TABLET BY MOUTH EVERY DAY 90 tablet 2  . KLOR-CON M20 20 MEQ tablet TAKE 1 TABLET (20 MEQ TOTAL) BY  MOUTH DAILY. 90 tablet 2  . KLOR-CON M20 20 MEQ tablet TAKE 1 TABLET BY MOUTH EVERY DAY 90 tablet 2  . levothyroxine (SYNTHROID, LEVOTHROID) 50 MCG tablet Take 1 tablet (50 mcg total) by mouth daily. 90 tablet 3  . montelukast (SINGULAIR) 10 MG tablet Take 1 tablet (10 mg total) by mouth at bedtime. 90 tablet 3  . Multiple Vitamin (MULTIVITAMIN) capsule Take 1 capsule by mouth daily.      . traMADol (ULTRAM-ER) 200 MG 24 hr tablet TAKE 1 TABLET BY MOUTH EVERY DAY AS NEEDED FOR PAIN 30 tablet 3   No current facility-administered medications on file prior to visit.     Review of Systems  Constitutional: Negative for unusual diaphoresis or night sweats HENT: Negative for ear swelling or discharge Eyes: Negative for worsening visual haziness  Respiratory: Negative for choking and stridor.   Gastrointestinal: Negative for distension or worsening eructation Genitourinary: Negative for retention or change in urine volume.  Musculoskeletal: Negative for other MSK pain or swelling Skin: Negative for color change and worsening wound Neurological: Negative for tremors and numbness other than noted  Psychiatric/Behavioral: Negative for decreased concentration or agitation other than above       Objective:   Physical Exam BP 132/70   Pulse 73   Temp 98 F (36.7 C) (Oral)   Resp 20   Wt 164 lb (74.4 kg)   SpO2 97%   BMI 24.94 kg/m  VS noted,  Constitutional: Pt appears in no apparent distress HENT: Head: NCAT.  Right Ear: External ear normal.  Left Ear: External ear normal.  Eyes: . Pupils are equal, round, and reactive to light. Conjunctivae and EOM are normal Neck: Normal range of motion. Neck supple.  Cardiovascular: Normal rate and regular rhythm.   Pulmonary/Chest: Effort normal and breath sounds without rales or wheezing.  Neurological: Pt is alert. Not confused , motor grossly intact Skin: Skin is warm. No rash, no LE edema Psychiatric: Pt behavior is normal. No agitation.       Assessment & Plan:

## 2015-12-29 NOTE — Patient Instructions (Signed)

## 2016-01-01 ENCOUNTER — Other Ambulatory Visit: Payer: Self-pay | Admitting: Internal Medicine

## 2016-01-01 NOTE — Assessment & Plan Note (Signed)
stable overall by history and exam, recent data reviewed with pt, and pt to continue medical treatment as before,  to f/u any worsening symptoms or concerns Lab Results  Component Value Date   LDLCALC 91 12/29/2015

## 2016-01-01 NOTE — Assessment & Plan Note (Signed)
stable overall by history and exam, recent data reviewed with pt, and pt to continue medical treatment as before,  to f/u any worsening symptoms or concerns Lab Results  Component Value Date   TSH 6.31 (H) 06/28/2015

## 2016-01-01 NOTE — Assessment & Plan Note (Signed)
stable overall by history and exam, recent data reviewed with pt, and pt to continue medical treatment as before,  to f/u any worsening symptoms or concerns BP Readings from Last 3 Encounters:  12/29/15 132/70  08/09/15 128/82  07/13/15 136/82

## 2016-01-01 NOTE — Assessment & Plan Note (Signed)
stable overall by history and exam, recent data reviewed with pt, and pt to continue medical treatment as before,  to f/u any worsening symptoms or concerns Lab Results  Component Value Date   HGBA1C 5.7 12/29/2015    

## 2016-01-24 ENCOUNTER — Other Ambulatory Visit: Payer: Self-pay | Admitting: Internal Medicine

## 2016-01-31 ENCOUNTER — Encounter: Payer: Self-pay | Admitting: Internal Medicine

## 2016-01-31 ENCOUNTER — Other Ambulatory Visit: Payer: Self-pay | Admitting: Internal Medicine

## 2016-01-31 DIAGNOSIS — F411 Generalized anxiety disorder: Secondary | ICD-10-CM

## 2016-01-31 DIAGNOSIS — F4329 Adjustment disorder with other symptoms: Secondary | ICD-10-CM

## 2016-02-02 ENCOUNTER — Other Ambulatory Visit: Payer: Self-pay | Admitting: Internal Medicine

## 2016-02-02 NOTE — Addendum Note (Signed)
Addended by: Corwin LevinsJOHN, JAMES W on: 02/02/2016 10:36 PM   Modules accepted: Orders

## 2016-02-06 DIAGNOSIS — H401131 Primary open-angle glaucoma, bilateral, mild stage: Secondary | ICD-10-CM | POA: Insufficient documentation

## 2016-02-06 DIAGNOSIS — H21541 Posterior synechiae (iris), right eye: Secondary | ICD-10-CM | POA: Insufficient documentation

## 2016-03-01 ENCOUNTER — Other Ambulatory Visit: Payer: Self-pay | Admitting: Internal Medicine

## 2016-03-08 ENCOUNTER — Other Ambulatory Visit: Payer: Self-pay | Admitting: Family

## 2016-03-08 ENCOUNTER — Encounter: Payer: Self-pay | Admitting: Internal Medicine

## 2016-03-08 MED ORDER — TRAMADOL HCL ER 200 MG PO TB24: 200.0000 mg | ORAL_TABLET | Freq: Every day | ORAL | 2 refills | Status: DC | PRN

## 2016-03-08 MED ORDER — AMOXICILLIN 500 MG PO CAPS: ORAL_CAPSULE | ORAL | 0 refills | Status: DC

## 2016-03-08 NOTE — Addendum Note (Signed)
Addended by: Corwin LevinsJOHN, Jalene Lacko W on: 03/08/2016 01:03 PM   Modules accepted: Orders

## 2016-03-08 NOTE — Telephone Encounter (Signed)
This rx was already done eariier today hardcopy to corinne

## 2016-03-08 NOTE — Telephone Encounter (Signed)
Rx faxed to pharmacy  

## 2016-03-09 ENCOUNTER — Ambulatory Visit (INDEPENDENT_AMBULATORY_CARE_PROVIDER_SITE_OTHER): Payer: BLUE CROSS/BLUE SHIELD

## 2016-03-09 DIAGNOSIS — Z23 Encounter for immunization: Secondary | ICD-10-CM | POA: Diagnosis not present

## 2016-03-09 NOTE — Telephone Encounter (Signed)
Already addressed, let me know if somehow the prescriptoin I gave to corinne cannot be found

## 2016-03-12 ENCOUNTER — Ambulatory Visit: Payer: BLUE CROSS/BLUE SHIELD | Admitting: Licensed Clinical Social Worker

## 2016-03-13 NOTE — Telephone Encounter (Signed)
Done hardcopy to Corinne  

## 2016-03-13 NOTE — Telephone Encounter (Signed)
Do not have prescription for this patient.

## 2016-03-20 ENCOUNTER — Ambulatory Visit (INDEPENDENT_AMBULATORY_CARE_PROVIDER_SITE_OTHER): Payer: BLUE CROSS/BLUE SHIELD | Admitting: Licensed Clinical Social Worker

## 2016-03-20 DIAGNOSIS — F330 Major depressive disorder, recurrent, mild: Secondary | ICD-10-CM

## 2016-03-29 ENCOUNTER — Other Ambulatory Visit: Payer: Self-pay | Admitting: Internal Medicine

## 2016-04-10 ENCOUNTER — Ambulatory Visit (INDEPENDENT_AMBULATORY_CARE_PROVIDER_SITE_OTHER): Payer: BLUE CROSS/BLUE SHIELD | Admitting: Licensed Clinical Social Worker

## 2016-04-10 DIAGNOSIS — F3341 Major depressive disorder, recurrent, in partial remission: Secondary | ICD-10-CM

## 2016-04-12 ENCOUNTER — Encounter: Payer: Self-pay | Admitting: Family Medicine

## 2016-04-13 MED ORDER — DICLOFENAC SODIUM 1 % TD GEL: 2.0000 g | Freq: Four times a day (QID) | TRANSDERMAL | 11 refills | Status: DC

## 2016-04-13 MED ORDER — MELOXICAM 7.5 MG PO TABS: 7.5000 mg | ORAL_TABLET | Freq: Every day | ORAL | 0 refills | Status: DC

## 2016-04-29 ENCOUNTER — Other Ambulatory Visit: Payer: Self-pay | Admitting: Internal Medicine

## 2016-05-08 ENCOUNTER — Ambulatory Visit: Payer: BLUE CROSS/BLUE SHIELD | Admitting: Licensed Clinical Social Worker

## 2016-05-10 ENCOUNTER — Encounter: Payer: Self-pay | Admitting: Internal Medicine

## 2016-05-10 DIAGNOSIS — N63 Unspecified lump in unspecified breast: Secondary | ICD-10-CM

## 2016-05-12 ENCOUNTER — Other Ambulatory Visit: Payer: Self-pay | Admitting: Family Medicine

## 2016-05-14 NOTE — Telephone Encounter (Signed)
Refill done.  

## 2016-05-16 ENCOUNTER — Encounter: Payer: Self-pay | Admitting: Internal Medicine

## 2016-05-16 NOTE — Addendum Note (Signed)
Addended by: Corwin LevinsJOHN, Lakeith Careaga W on: 05/16/2016 05:28 PM   Modules accepted: Orders

## 2016-05-16 NOTE — Telephone Encounter (Signed)
Staff to contact pt - Is due for ROV mar 2018 - f/u DM

## 2016-05-22 ENCOUNTER — Encounter: Payer: Self-pay | Admitting: Internal Medicine

## 2016-06-02 ENCOUNTER — Other Ambulatory Visit: Payer: Self-pay | Admitting: Internal Medicine

## 2016-06-04 ENCOUNTER — Other Ambulatory Visit: Payer: Self-pay | Admitting: Internal Medicine

## 2016-06-04 NOTE — Telephone Encounter (Signed)
faxed

## 2016-06-15 ENCOUNTER — Encounter: Payer: Self-pay | Admitting: Internal Medicine

## 2016-06-15 LAB — HM DIABETES EYE EXAM

## 2016-06-18 ENCOUNTER — Encounter: Payer: Self-pay | Admitting: *Deleted

## 2016-06-21 ENCOUNTER — Encounter: Payer: Self-pay | Admitting: Internal Medicine

## 2016-06-22 ENCOUNTER — Encounter: Payer: Self-pay | Admitting: Internal Medicine

## 2016-06-25 ENCOUNTER — Other Ambulatory Visit: Payer: Self-pay | Admitting: Internal Medicine

## 2016-06-25 ENCOUNTER — Other Ambulatory Visit (INDEPENDENT_AMBULATORY_CARE_PROVIDER_SITE_OTHER): Payer: BLUE CROSS/BLUE SHIELD

## 2016-06-25 DIAGNOSIS — Z0001 Encounter for general adult medical examination with abnormal findings: Secondary | ICD-10-CM | POA: Diagnosis not present

## 2016-06-25 DIAGNOSIS — E119 Type 2 diabetes mellitus without complications: Secondary | ICD-10-CM | POA: Diagnosis not present

## 2016-06-25 LAB — URINALYSIS, ROUTINE W REFLEX MICROSCOPIC
Bilirubin Urine: NEGATIVE
Hgb urine dipstick: NEGATIVE
Ketones, ur: NEGATIVE
Leukocytes, UA: NEGATIVE
Nitrite: NEGATIVE
RBC / HPF: NONE SEEN
Specific Gravity, Urine: 1.02
Total Protein, Urine: NEGATIVE
Urine Glucose: NEGATIVE
Urobilinogen, UA: 0.2
pH: 7 (ref 5.0–8.0)

## 2016-06-25 LAB — BASIC METABOLIC PANEL WITH GFR
BUN: 14 mg/dL (ref 6–23)
CO2: 32 meq/L (ref 19–32)
Calcium: 10.1 mg/dL (ref 8.4–10.5)
Chloride: 104 meq/L (ref 96–112)
Creatinine, Ser: 0.95 mg/dL (ref 0.40–1.20)
GFR: 76.49 mL/min
Glucose, Bld: 108 mg/dL — ABNORMAL HIGH (ref 70–99)
Potassium: 5.4 meq/L — ABNORMAL HIGH (ref 3.5–5.1)
Sodium: 142 meq/L (ref 135–145)

## 2016-06-25 LAB — LIPID PANEL
CHOL/HDL RATIO: 2
Cholesterol: 183 mg/dL (ref 0–200)
HDL: 87.6 mg/dL (ref >39.00)
LDL CALC: 85 mg/dL (ref 0–99)
NonHDL: 95.27
Triglycerides: 50 mg/dL (ref 0.0–149.0)
VLDL: 10 mg/dL (ref 0.0–40.0)

## 2016-06-25 LAB — CBC WITH DIFFERENTIAL/PLATELET
Basophils Absolute: 0.1 10*3/uL (ref 0.0–0.1)
Basophils Relative: 0.9 % (ref 0.0–3.0)
Eosinophils Absolute: 0.2 10*3/uL (ref 0.0–0.7)
Eosinophils Relative: 3.7 % (ref 0.0–5.0)
HCT: 38.1 % (ref 36.0–46.0)
Hemoglobin: 13.1 g/dL (ref 12.0–15.0)
Lymphocytes Relative: 50.2 % — ABNORMAL HIGH (ref 12.0–46.0)
Lymphs Abs: 3 10*3/uL (ref 0.7–4.0)
MCHC: 34.5 g/dL (ref 30.0–36.0)
MCV: 83.9 fl (ref 78.0–100.0)
Monocytes Absolute: 0.4 10*3/uL (ref 0.1–1.0)
Monocytes Relative: 7.3 % (ref 3.0–12.0)
Neutro Abs: 2.3 10*3/uL (ref 1.4–7.7)
Neutrophils Relative %: 37.9 % — ABNORMAL LOW (ref 43.0–77.0)
Platelets: 255 10*3/uL (ref 150.0–400.0)
RBC: 4.53 Mil/uL (ref 3.87–5.11)
RDW: 15.5 % (ref 11.5–15.5)
WBC: 6 10*3/uL (ref 4.0–10.5)

## 2016-06-25 LAB — MICROALBUMIN / CREATININE URINE RATIO
Creatinine,U: 268.5 mg/dL
Microalb Creat Ratio: 0.6 mg/g (ref 0.0–30.0)
Microalb, Ur: 1.5 mg/dL (ref 0.0–1.9)

## 2016-06-25 LAB — HEPATIC FUNCTION PANEL
ALT: 13 U/L (ref 0–35)
AST: 15 U/L (ref 0–37)
Albumin: 3.7 g/dL (ref 3.5–5.2)
Alkaline Phosphatase: 103 U/L (ref 39–117)
Bilirubin, Direct: 0.1 mg/dL (ref 0.0–0.3)
Total Bilirubin: 0.4 mg/dL (ref 0.2–1.2)
Total Protein: 6.6 g/dL (ref 6.0–8.3)

## 2016-06-25 LAB — HEMOGLOBIN A1C: Hgb A1c MFr Bld: 5.7 % (ref 4.6–6.5)

## 2016-06-25 LAB — TSH: TSH: 6.55 u[IU]/mL — ABNORMAL HIGH (ref 0.35–4.50)

## 2016-06-25 NOTE — Telephone Encounter (Signed)
Routing to dr john, please advise, thanks 

## 2016-06-26 ENCOUNTER — Ambulatory Visit (INDEPENDENT_AMBULATORY_CARE_PROVIDER_SITE_OTHER): Payer: BC Managed Care – PPO | Admitting: Internal Medicine

## 2016-06-26 ENCOUNTER — Encounter: Payer: Self-pay | Admitting: Internal Medicine

## 2016-06-26 VITALS — BP 146/92 | HR 63 | Temp 97.6°F | Ht 68.0 in | Wt 170.0 lb

## 2016-06-26 DIAGNOSIS — I890 Lymphedema, not elsewhere classified: Secondary | ICD-10-CM

## 2016-06-26 DIAGNOSIS — E875 Hyperkalemia: Secondary | ICD-10-CM | POA: Insufficient documentation

## 2016-06-26 DIAGNOSIS — Z23 Encounter for immunization: Secondary | ICD-10-CM

## 2016-06-26 DIAGNOSIS — E119 Type 2 diabetes mellitus without complications: Secondary | ICD-10-CM

## 2016-06-26 DIAGNOSIS — E039 Hypothyroidism, unspecified: Secondary | ICD-10-CM

## 2016-06-26 DIAGNOSIS — E876 Hypokalemia: Secondary | ICD-10-CM | POA: Diagnosis not present

## 2016-06-26 DIAGNOSIS — Z0001 Encounter for general adult medical examination with abnormal findings: Secondary | ICD-10-CM

## 2016-06-26 DIAGNOSIS — I10 Essential (primary) hypertension: Secondary | ICD-10-CM

## 2016-06-26 DIAGNOSIS — H9193 Unspecified hearing loss, bilateral: Secondary | ICD-10-CM | POA: Diagnosis not present

## 2016-06-26 MED ORDER — POTASSIUM CHLORIDE CRYS ER 10 MEQ PO TBCR: 10.0000 meq | EXTENDED_RELEASE_TABLET | Freq: Every day | ORAL | 3 refills | Status: DC

## 2016-06-26 MED ORDER — LEVOTHYROXINE SODIUM 75 MCG PO TABS: 75.0000 ug | ORAL_TABLET | Freq: Every day | ORAL | 3 refills | Status: DC

## 2016-06-26 MED ORDER — PNEUMOCOCCAL 13-VAL CONJ VACC IM SUSP
0.5000 mL | INTRAMUSCULAR | Status: AC
  Administered 2016-07-02: 0.5 mL via INTRAMUSCULAR

## 2016-06-26 NOTE — Patient Instructions (Addendum)
You had the Tdap (tetanus) and Prevnar pneumonia shot today  OK to increase the thyroid medication to 75 mcg per day  OK to decrease the potassium from 20 to 10 per day  Please continue all other medications as before, and refills have been done if requested.  Please have the pharmacy call with any other refills you may need.  Please continue your efforts at being more active, low cholesterol diet, and weight control.  You are otherwise up to date with prevention measures today.  Please keep your appointments with your specialists as you may have planned  You will be contacted regarding the referral for: ENT, and lymphedema clinic  Please return in 6 months, or sooner if needed, with Lab testing done 3-5 days before

## 2016-06-26 NOTE — Progress Notes (Addendum)
Subjective:    Patient ID: Penny Yates, female    DOB: 03/01/1956, 61 y.o.   MRN: [id number]    HPI   Here for wellness and f/u;  Overall doing ok;  Pt denies Chest pain, worsening SOB, DOE, wheezing, orthopnea, PND, worsening LE edema, palpitations, dizziness or syncope.  Pt denies neurological change such as new headache, facial or extremity weakness.  Pt denies polydipsia, polyuria, or low sugar symptoms. Pt states overall good compliance with treatment and medications, good tolerability, and has been trying to follow appropriate diet.  Pt denies worsening depressive symptoms, suicidal ideation or panic. No fever, night sweats, wt loss, loss of appetite, or other constitutional symptoms.  Pt states good ability with ADL's, has low fall risk, home safety reviewed and adequate, no other significant changes in vision, and only occasionally active with exercise. 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  Has gained 8 lbs with less activity.   Wt Readings from Last 3 Encounters:  06/26/16 170 lb (77.1 kg)  12/29/15 164 lb (74.4 kg)  08/09/15 162 lb (73.5 kg)   BP Readings from Last 3 Encounters:  06/26/16 (!) 146/92  12/29/15 132/70  08/09/15 128/82  Has not yet taken bp med today and more stress in addition to wt gain.  Denies hyper or hypo thyroid symptoms such as voice, skin or hair change.  Asks for bilat LE swelling.  Also with worsening hearing loss in last 6 mo.   Past Medical History:  Diagnosis Date  . Anemia   . Chronic venous insufficiency    LE's  . Depression   . Diabetes mellitus without complication (HCC)   . Fracture of shaft of right radius 01/06/2013  . History of colonic polyps    multiple of succeeding years 96, 97, 98, and 99  . Hyperlipidemia   . Hypertension   . Hyperthyroidism    s/p rad Iodine-ellison  . Hypothyroidism 01/19/2015  . Leg length discrepancy    right leg longer  . Low back pain   . Lymphedema    left arm, both legs; primary lymphedema  . Sacroiliitis (HCC)    history of  . Sciatica    Past Surgical History:  Procedure Laterality Date  . EYE SURGERY     both eyes-muscle repair  . OPEN REDUCTION INTERNAL FIXATION (ORIF) DISTAL RADIAL FRACTURE Right 01/06/2013   Procedure: RIGHT OPEN REDUCTION INTERNAL FIXATION (ORIF) DISTAL ARTICULATING RADIAL FRACTURE ;  Surgeon: Eulas Post, MD;  Location: [facility];  Service: Orthopedics;  Laterality: Right;  . 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 REVISION OF SCAR TISSUE RECTUS MUSCLE  11/08   right knee  . TUBAL LIGATION      reports that she has never smoked. She has never used smokeless tobacco. She reports that she does not drink alcohol or use drugs. family history includes Cancer in her mother; Colon cancer (age of onset: 32) in her mother; Diabetes in her mother; Heart disease in her maternal grandmother. Allergies  Allergen Reactions  . Ace Inhibitors Cough  . Lisinopril     Cough   Current Outpatient Prescriptions on File Prior to Visit  Medication Sig Dispense Refill  . amoxicillin (AMOXIL) 500 MG capsule 2 tabs by mouth 1 hour prior to dental procedure, then 2 tabs by mouth 4 hrs after 30 capsule 0  . aspirin 81 MG tablet Take 81 mg  by mouth daily.      Marland Kitchen atorvastatin (LIPITOR) 10 MG tablet TAKE 1 TABLET BY MOUTH EVERY DAY 90 tablet 2  . Biotin 5000 MCG CAPS Take by mouth.      . Calcium Carbonate-Vitamin D 300-100 MG-UNIT CAPS Take by mouth. Take one daily      . diclofenac sodium (VOLTAREN) 1 % GEL Apply 2 g topically 4 (four) times daily. To affected joint. 100 g 11  . diltiazem (CARDIZEM CD) 360 MG 24 hr capsule TAKE 1 CAPSULE (360 MG TOTAL) BY MOUTH DAILY. 90 capsule 1  . Dorzolamide HCl-Timolol Mal PF 22.3-6.8 MG/ML SOLN Apply 1 drop to eye 2 (two) times daily. 1 drop in each eye twice a day.    . fish oil-omega-3 fatty acids 1000 MG capsule Take 1 capsule by mouth daily.     . furosemide  (LASIX) 40 MG tablet TAKE 1 TABLET BY MOUTH EVERY DAY 90 tablet 2  . guanFACINE (TENEX) 1 MG tablet TAKE 1 TABLET BY MOUTH AT BEDTIME 30 tablet 1  . irbesartan (AVAPRO) 300 MG tablet TAKE 1 TABLET BY MOUTH EVERY DAY 90 tablet 2  . meloxicam (MOBIC) 7.5 MG tablet TAKE 1 TABLET BY MOUTH EVERY DAY 30 tablet 0  . montelukast (SINGULAIR) 10 MG tablet Take 1 tablet (10 mg total) by mouth at bedtime. 90 tablet 3  . Multiple Vitamin (MULTIVITAMIN) capsule Take 1 capsule by mouth daily.      . traMADol (ULTRAM-ER) 200 MG 24 hr tablet TAKE 1 TABLET BY MOUTH DAILY AS NEEDED FOR PAIN 30 tablet 0   No current facility-administered medications on file prior to visit.    Review of Systems Constitutional: Negative for increased diaphoresis, or other activity, appetite or siginficant weight change other than noted HENT: Negative for worsening hearing loss, ear pain, facial swelling, mouth sores and neck stiffness.   Eyes: Negative for other worsening pain, redness or visual disturbance.  Respiratory: Negative for choking or stridor Cardiovascular: Negative for other chest pain and palpitations.  Gastrointestinal: Negative for worsening diarrhea, blood in stool, or abdominal distention Genitourinary: Negative for hematuria, flank pain or change in urine volume.  Musculoskeletal: Negative for myalgias or other joint complaints.  Skin: Negative for other color change and wound or drainage.  Neurological: Negative for syncope and numbness. other than noted Hematological: Negative for adenopathy. or other swelling Psychiatric/Behavioral: Negative for hallucinations, SI, self-injury, decreased concentration or other worsening agitation.  All other system neg per pt    Objective:   Physical Exam BP (!) 146/92   Pulse 63   Temp 97.6 F (36.4 C)   Ht 5\' 8"  (1.727 m)   Wt 170 lb (77.1 kg)   SpO2 98%   BMI 25.85 kg/m  VS noted,  Constitutional: Pt is oriented to person, place, and time. Appears  well-developed and well-nourished, in no significant distress Head: Normocephalic and atraumatic  Eyes: Conjunctivae and EOM are normal. Pupils are equal, round, and reactive to light Right Ear: External ear normal.  Left Ear: External ear normal Nose: Nose normal.  Mouth/Throat: Oropharynx is clear and moist  Neck: Normal range of motion. Neck supple. No JVD present. No tracheal deviation present or significant neck LA or mass Cardiovascular: Normal rate, regular rhythm, normal heart sounds and intact distal pulses.   Pulmonary/Chest: Effort normal and breath sounds without rales or wheezing  Abdominal: Soft. Bowel sounds are normal. NT. No HSM  Musculoskeletal: Normal range of motion. Exhibits 2-3+ bilat chronic lymphedema Lymphadenopathy:  Has no cervical adenopathy.  Neurological: Pt is alert and oriented to person, place, and time. Pt has normal reflexes. No cranial nerve deficit. Motor grossly intact Skin: Skin is warm and dry. No rash noted or new ulcers Psychiatric:  Has normal mood and affect. Behavior is normal.  No other new exam findings    Assessment & Plan:

## 2016-06-30 ENCOUNTER — Other Ambulatory Visit: Payer: Self-pay | Admitting: Family

## 2016-07-01 NOTE — Assessment & Plan Note (Signed)
Ok for referral to lymphedema if accepted, cont compresses

## 2016-07-01 NOTE — Assessment & Plan Note (Signed)
Mild uncontrolled, for increased levothryoxine to 75 qd, f/u lab next visit

## 2016-07-01 NOTE — Assessment & Plan Note (Signed)
Overcontrolled, to reduce the Kdur to 10 qd, f/u next visit

## 2016-07-01 NOTE — Assessment & Plan Note (Signed)
stable overall by history and exam, recent data reviewed with pt, and pt to continue medical treatment as before,  to f/u any worsening symptoms or concerns Lab Results  Component Value Date   HGBA1C 5.7 06/25/2016

## 2016-07-01 NOTE — Assessment & Plan Note (Signed)
stable overall by history and exam, recent data reviewed with pt, and pt to continue medical treatment as before,  to f/u any worsening symptoms or concerns BP Readings from Last 3 Encounters:  06/26/16 (!) 146/92  12/29/15 132/70  08/09/15 128/82

## 2016-07-01 NOTE — Assessment & Plan Note (Signed)

## 2016-07-01 NOTE — Assessment & Plan Note (Signed)
With mild recent worsening, for ENT referral, exam o/w benign

## 2016-07-03 NOTE — Telephone Encounter (Signed)
Faxed script back to [facility].../[facility] 

## 2016-07-03 NOTE — Telephone Encounter (Signed)
Done hardcopy to Anna  

## 2016-07-08 ENCOUNTER — Other Ambulatory Visit: Payer: Self-pay | Admitting: Family

## 2016-07-09 ENCOUNTER — Encounter: Payer: Self-pay | Admitting: Internal Medicine

## 2016-07-10 NOTE — Telephone Encounter (Signed)
I have redone the tramadol - Done hardcopy to Shirron  Not sure what happened to the rx from mar 13  OK to call pharmacy to let then know we do not need the mar 13 rx filled, only today's rx

## 2016-07-11 NOTE — Telephone Encounter (Signed)
What diagnosis is the tramadol prescribed for?

## 2016-07-11 NOTE — Progress Notes (Signed)
error 

## 2016-07-11 NOTE — Telephone Encounter (Signed)
m54.5

## 2016-07-11 NOTE — Telephone Encounter (Signed)
ythis has already been addressed

## 2016-07-20 ENCOUNTER — Ambulatory Visit: Payer: BC Managed Care – PPO | Attending: Internal Medicine | Admitting: Occupational Therapy

## 2016-07-20 ENCOUNTER — Encounter: Payer: Self-pay | Admitting: Occupational Therapy

## 2016-07-20 DIAGNOSIS — I890 Lymphedema, not elsewhere classified: Secondary | ICD-10-CM

## 2016-07-20 NOTE — Patient Instructions (Signed)

## 2016-07-20 NOTE — Therapy (Addendum)
[facility] MAIN [facility] SERVICES [address] [place], [place], [address] Phone: [phone]   Fax:  [phone]  Occupational Therapy Evaluation  Patient Details  Name: Penny Yates MRN: [id number] Date of Birth: [birth year] Referring Provider: Corwin Levins, MD  Encounter Date: 07/20/2016    Past Medical History:  Diagnosis Date  . Anemia   . Chronic venous insufficiency    LE's  . Depression   . Diabetes mellitus without complication (HCC)   . Fracture of shaft of right radius 01/06/2013  . History of colonic polyps    multiple of succeeding years 96, 97, 98, and 99  . Hyperlipidemia   . Hypertension   . Hyperthyroidism    s/p rad Iodine-ellison  . Hypothyroidism 01/19/2015  . Leg length discrepancy    right leg longer  . Low back pain   . Lymphedema    left arm, both legs; primary lymphedema  . Sacroiliitis (HCC)    history of  . Sciatica     Past Surgical History:  Procedure Laterality Date  . EYE SURGERY     both eyes-muscle repair  . OPEN REDUCTION INTERNAL FIXATION (ORIF) DISTAL RADIAL FRACTURE Right 01/06/2013   Procedure: RIGHT OPEN REDUCTION INTERNAL FIXATION (ORIF) DISTAL ARTICULATING RADIAL FRACTURE ;  Surgeon: Penny Post, MD;  Location: [facility];  Service: Orthopedics;  Laterality: Right;  . 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 REVISION OF SCAR TISSUE RECTUS MUSCLE  11/08   right knee  . TUBAL LIGATION      There were no vitals filed for this visit.      Subjective Assessment - 07/24/16 0916    Subjective  Pt is referred by Penny Levins, MD for Occupational Therapy evaluation and treatment of BLE lymphedema w/ onset reportedly shortly after the birth of her second child. 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  and lipo-lymphedema components   Referring Provider Penny Levins, MD   Onset Date --  > 20 years after birth of 2nd child   Prior Therapy BLE CDT w/ wrapping, MLD and ccl 2 OTS pantyhose     Precautions   Precautions --  HYPOTHYROID- NO MLD to neck     Balance Screen   Has the patient fallen in the past 6 months No     Home  Environment   Additional Comments -     Prior Function   Level of Independence Independent with basic ADLs;Independent with household mobility without device;Independent with community mobility without device;Independent with homemaking with ambulation;Independent with gait;Independent with transfers   Vocation Full time employment;Other (comment)   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   Leisure family     IADL   Prior Level of Geophysicist/field seismologist independently for [employer]   Prior Level of Function Light Housekeeping Independent   Light Housekeeping Performs light daily tasks such as dishwashing, bed making   Prior Level of Function Meal Prep Independent   Meal Prep Plans, prepares and serves adequate meals independently   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 Activities requiring extended standing, walking and sitting are limitted by BLE pain and swelling     Cognition   Overall Cognitive Status Within Functional Limits for tasks assessed     Observation/Other Assessments   Skin Integrity Light, BLE hemocidrine staining at distal legs and ankles. Mild creases at base of toes bilaterally. Mild, soft, fatty, doughy, non-pitting swelling distributed from  toes to groin  L>R. Ankles w/ slight "pantaloon" w/ B feet involved. Strong positive Stemmer sign. Skin well hydrated overall. Skin temp WNL. Pt hypersensative to light palpation and reports easy bruising,.     Coordination   Gross Motor Movements are Fluid and Coordinated Yes   Fine Motor Movements are Fluid and Coordinated Yes     ROM / Strength   AROM / PROM / Strength Strength;AROM     AROM   Overall AROM  Within functional limits for tasks performed   Overall AROM Comments B ankle and knee AROM limiited by swelling and tissue tightness     Strength   Overall Strength Within functional limits for tasks performed          LYMPHEDEMA/ONCOLOGY QUESTIONNAIRE - 07/24/16 0950      What other symptoms do you have   Are you Having Heaviness or Tightness Yes   Are you having Pain Yes   Are you having pitting edema No   Is it Hard or Difficult finding clothes that fit Yes   Do you have infections No   Is there Decreased scar mobility Yes   Stemmer Sign Yes     Lymphedema Stage   Stage STAGE 2 SPONTANEOUSLY IRREVERSIBLE     Lymphedema Assessments   Lymphedema Assessments Lower extremities     Right Lower Extremity Lymphedema   Other BLE comparative limb volumetrics TBA at initial Rx visit. Visually appears L>R by ~ 10%                OT Treatments/Exercises (OP) - 07/24/16 0001      Transfers   Transfers Sit to Stand   Sit to Stand With armrests     ADLs   LB Dressing limited by BLE swelling and pain   Functional Mobility limited by BLE swelling and pain   Cooking limited by BLE swelling and pain   Home Maintenance limited by BLE swelling and pain   Work limited by BLE swelling and pain   Leisure limited by BLE swelling and pain   ADL Education Given Yes     Manual Therapy   Manual Therapy Edema management                    OT Long Term Goals - 07/24/16 1610      OT LONG TERM GOAL #1   Title Pt independent w/ lymphedema precautions and  prevention principals and strategies to limit LE progression and infection risk.   Baseline dependent   Time 1   Period Weeks   Status New     OT LONG TERM GOAL #2   Title Lymphedema (LE) management/ self-care: Pt able to apply multi layered, gradient compression wraps w independently using proper techniques within 2 weeks to achieve optimal limb volume reductions bilaterally.   Baseline dependent   Time 2   Period Weeks  Status New     OT LONG TERM GOAL #3   Title Lymphedema (LE) management/ self-care:  Pt to achieve at least 10% limb volume reductions bilaterally during Intensive CDT to limit LE progression, to reduce pain, and to improve safe ambulation and functional mobility.   Baseline dependent   Time 12   Period Weeks   Status New     OT LONG TERM GOAL #4   Title Lymphedema (LE) management/ self-care:  Pt to tolerate daily compression wraps, garments and devices in keeping w/ prescribed wear regime within 1 week of issue date to progress and retain clinical and functional gains and to limit LE progression.   Baseline dependent   Time 12   Period Weeks   Status New     OT LONG TERM GOAL #5   Title Lymphedema (LE) management/ self-care:  During Management Phase CDT Pt to sustain current limb volumes within 5%, and all other clinical gains achieved during OT treatment with needed level of caregiver assistance to limit LE progression, infection risk and further functional decline.   Baseline dependent   Time 6   Period Months   Status New     OT LONG TERM GOAL #6   Baseline dependent   Time 6             Patient will benefit from skilled therapeutic intervention in order to improve the following deficits and impairments:  Abnormal gait, Decreased skin integrity, Decreased knowledge of precautions, Decreased activity tolerance, Decreased knowledge of use of DME, Impaired flexibility, Decreased mobility, Difficulty walking, Decreased range of motion, Increased edema,  Pain  Visit Diagnosis: Lymphedema, not elsewhere classified - Plan: Ot plan of care cert/re-cert    Problem List Patient Active Problem List   Diagnosis Date Noted  . Hyperkalemia 06/26/2016  . Acquired lymphedema of leg 06/26/2016  . Leg length discrepancy 07/13/2015  . 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 Headache 06/28/2015  . Hypothyroidism 01/19/2015  . Right flank pain 01/14/2015  . Fracture of shaft of right radius 01/06/2013  . Palpitations 02/12/2012  . Diabetes (HCC) 01/08/2011  . Encounter for well adult exam with abnormal findings 01/05/2011  . Hypopotassemia 10/18/2010  . Edema 07/26/2010  . SYNCOPE, HX OF 07/03/2010  . OSTEOPENIA 11/01/2009  . Bilateral hearing loss 06/17/2009  . DEPRESSION 01/07/2009  . FATIGUE 01/07/2009  . OBSTRUCTIVE SLEEP APNEA 11/17/2008  . HYPERSOMNIA 10/27/2008  . DIVERTICULOSIS, COLON 06/04/2008  . KNEE PAIN, RIGHT 01/27/2008  . BACK PAIN 01/27/2008  . GOITER, MULTINODULAR 10/08/2007  . HYPERTHYROIDISM 10/08/2007  . MENOPAUSAL SYNDROME 10/08/2007  . Hyperlipidemia 09/19/2007  . ANEMIA-IRON DEFICIENCY 09/19/2007  . Essential hypertension 09/19/2007  . LOW BACK PAIN 09/19/2007  . WEIGHT LOSS 09/19/2007  . COLONIC POLYPS, HX OF 09/19/2007    Loel Dubonnet, MS, OTR/L, [facility] 07/24/16 10:09 AM  [place] [facility] MAIN [facility] SERVICES [address] [place], [place], [address] Phone: [phone]   Fax:  [phone]  Name: Penny Yates MRN: [id number] Date of Birth: [birth year]

## 2016-07-25 NOTE — Therapy (Signed)
[place] [facility] MAIN [facility] SERVICES [address] [place], [place], [address] Phone: [phone]   Fax:  [phone]  Occupational Therapy Evaluation  Patient Details  Name: Penny Yates MRN: [id number] Date of Birth: [birth year] Referring Provider: Corwin Levins, MD  Encounter Date: 07/20/2016    Past Medical History:  Diagnosis Date  . Anemia   . Chronic venous insufficiency    LE's  . Depression   . Diabetes mellitus without complication (HCC)   . Fracture of shaft of right radius 01/06/2013  . History of colonic polyps    multiple of succeeding years 96, 97, 98, and 99  . Hyperlipidemia   . Hypertension   . Hyperthyroidism    s/p rad Iodine-ellison  . Hypothyroidism 01/19/2015  . Leg length discrepancy    right leg longer  . Low back pain   . Lymphedema    left arm, both legs; primary lymphedema  . Sacroiliitis (HCC)    history of  . Sciatica     Past Surgical History:  Procedure Laterality Date  . EYE SURGERY     both eyes-muscle repair  . OPEN REDUCTION INTERNAL FIXATION (ORIF) DISTAL RADIAL FRACTURE Right 01/06/2013   Procedure: RIGHT OPEN REDUCTION INTERNAL FIXATION (ORIF) DISTAL ARTICULATING RADIAL FRACTURE ;  Surgeon: Eulas Post, MD;  Location: [facility];  Service: Orthopedics;  Laterality: Right;  . 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 REVISION OF SCAR TISSUE RECTUS MUSCLE  11/08   right knee  . TUBAL LIGATION      There were no vitals filed for this visit.      Subjective Assessment - 07/24/16 0916    Subjective  Pt is referred by Corwin Levins, MD for Occupational Therapy evaluation and treatment of BLE lymphedema w/ onset reportedly shortly after the birth of her second child. She previously underwent LE treatmentand currently utilizes              LYMPHEDEMA/ONCOLOGY QUESTIONNAIRE - 07/24/16 0950      What other symptoms do you have   Are you Having Heaviness or Tightness  Yes   Are you having Pain Yes   Are you having pitting edema No   Is it Hard or Difficult finding clothes that fit Yes   Do you have infections No   Is there Decreased scar mobility Yes   Stemmer Sign Yes     Lymphedema Stage   Stage STAGE 2 SPONTANEOUSLY IRREVERSIBLE     Lymphedema Assessments   Lymphedema Assessments Lower extremities     Right Lower Extremity Lymphedema   Other BLE comparative limb volumetrics TBA at initial Rx visit. Visually appears L>R by ~ 10%                            OT Long Term Goals - 07/24/16 0952      OT LONG TERM GOAL #1   Title Pt independent w/ lymphedema precautions and prevention principals and strategies to limit LE progression and infection risk.   Baseline dependent   Time 1   Period Weeks   Status New     OT LONG TERM GOAL #2   Title Lymphedema (LE) management/ self-care: Pt able to apply multi layered, gradient compression wraps w independently using proper techniques within 2 weeks to achieve optimal limb volume reductions bilaterally.   Baseline dependent   Time 2   Period  Weeks   Status New     OT LONG TERM GOAL #3   Title Lymphedema (LE) management/ self-care:  Pt to achieve at least 10% limb volume reductions bilaterally during Intensive CDT to limit LE progression, to reduce pain, and to improve safe ambulation and functional mobility.   Baseline dependent   Time 12   Period Weeks   Status New     OT LONG TERM GOAL #4   Title Lymphedema (LE) management/ self-care:  Pt to tolerate daily compression wraps, garments and devices in keeping w/ prescribed wear regime within 1 week of issue date to progress and retain clinical and functional gains and to limit LE progression.   Baseline dependent   Time 12   Period Weeks   Status New     OT LONG TERM GOAL #5   Title Lymphedema (LE) management/ self-care:  During Management Phase CDT Pt to sustain current limb volumes within 5%, and all other clinical gains  achieved during OT treatment with needed level of caregiver assistance to limit LE progression, infection risk and further functional decline.   Baseline dependent   Time 6   Period Months   Status New     OT LONG TERM GOAL #6   Baseline dependent   Time 6             Patient will benefit from skilled therapeutic intervention in order to improve the following deficits and impairments:  Abnormal gait, Decreased skin integrity, Decreased knowledge of precautions, Decreased activity tolerance, Decreased knowledge of use of DME, Impaired flexibility, Decreased mobility, Difficulty walking, Decreased range of motion, Increased edema, Pain  Visit Diagnosis: Lymphedema, not elsewhere classified - Plan: Ot plan of care cert/re-cert    Problem List Patient Active Problem List   Diagnosis Date Noted  . Hyperkalemia 06/26/2016  . Acquired lymphedema of leg 06/26/2016  . Leg length discrepancy 07/13/2015  . 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 Headache 06/28/2015  . Hypothyroidism 01/19/2015  . Right flank pain 01/14/2015  . Fracture of shaft of right radius 01/06/2013  . Palpitations 02/12/2012  . Diabetes (HCC) 01/08/2011  . Encounter for well adult exam with abnormal findings 01/05/2011  . Hypopotassemia 10/18/2010  . Edema 07/26/2010  . SYNCOPE, HX OF 07/03/2010  . OSTEOPENIA 11/01/2009  . Bilateral hearing loss 06/17/2009  . DEPRESSION 01/07/2009  . FATIGUE 01/07/2009  . OBSTRUCTIVE SLEEP APNEA 11/17/2008  . HYPERSOMNIA 10/27/2008  . DIVERTICULOSIS, COLON 06/04/2008  . KNEE PAIN, RIGHT 01/27/2008  . BACK PAIN 01/27/2008  . GOITER, MULTINODULAR 10/08/2007  . HYPERTHYROIDISM 10/08/2007  . MENOPAUSAL SYNDROME 10/08/2007  . Hyperlipidemia 09/19/2007  . ANEMIA-IRON DEFICIENCY 09/19/2007  . Essential hypertension 09/19/2007  . LOW BACK PAIN 09/19/2007  . WEIGHT LOSS 09/19/2007  . COLONIC POLYPS, HX OF 09/19/2007    Loel Dubonnet, MS, OTR/L,  [facility] 07/25/16 4:17 PM  [place] [facility] MAIN [facility] SERVICES [address] [place], [place], [address] Phone: [phone]   Fax:  [phone]  Name: VICI NOVICK MRN: [id number] Date of Birth: Aug 29, 1955

## 2016-07-27 ENCOUNTER — Encounter: Payer: Self-pay | Admitting: Internal Medicine

## 2016-08-01 DIAGNOSIS — H903 Sensorineural hearing loss, bilateral: Secondary | ICD-10-CM | POA: Insufficient documentation

## 2016-08-01 DIAGNOSIS — J3089 Other allergic rhinitis: Secondary | ICD-10-CM | POA: Insufficient documentation

## 2016-08-10 ENCOUNTER — Encounter: Payer: BC Managed Care – PPO | Admitting: Occupational Therapy

## 2016-08-15 ENCOUNTER — Encounter: Payer: BC Managed Care – PPO | Admitting: Occupational Therapy

## 2016-08-17 ENCOUNTER — Encounter: Payer: BC Managed Care – PPO | Admitting: Occupational Therapy

## 2016-08-22 ENCOUNTER — Ambulatory Visit: Payer: BC Managed Care – PPO | Attending: Internal Medicine | Admitting: Occupational Therapy

## 2016-08-22 ENCOUNTER — Encounter: Payer: Self-pay | Admitting: Occupational Therapy

## 2016-08-22 DIAGNOSIS — I890 Lymphedema, not elsewhere classified: Secondary | ICD-10-CM | POA: Diagnosis present

## 2016-08-22 NOTE — Patient Instructions (Signed)

## 2016-08-22 NOTE — Therapy (Signed)
[place] [facility] MAIN [facility] SERVICES [address] [place], [place], [address] Phone: [phone]   Fax:  [phone]  Occupational Therapy Treatment  Patient Details  Name: Penny Yates MRN: [id number] Date of Birth: 08-24-1955 Referring Provider: Corwin Levins, MD  Encounter Date: 08/22/2016      OT End of Session - 08/22/16 1654    Visit Number 1   Number of Visits 36   Date for OT Re-Evaluation 10/18/16   OT Start Time 0307   OT Stop Time 0420   OT Time Calculation (min) 73 min   Activity Tolerance Patient tolerated treatment well;No increased pain   Behavior During Therapy WFL for tasks assessed/performed      Past Medical History:  Diagnosis Date  . Anemia   . Chronic venous insufficiency    LE's  . Depression   . Diabetes mellitus without complication (HCC)   . Fracture of shaft of right radius 01/06/2013  . History of colonic polyps    multiple of succeeding years 96, 97, 98, and 99  . Hyperlipidemia   . Hypertension   . Hyperthyroidism    s/p rad Iodine-ellison  . Hypothyroidism 01/19/2015  . Leg length discrepancy    right leg longer  . Low back pain   . Lymphedema    left arm, both legs; primary lymphedema  . Sacroiliitis (HCC)    history of  . Sciatica     Past Surgical History:  Procedure Laterality Date  . EYE SURGERY     both eyes-muscle repair  . OPEN REDUCTION INTERNAL FIXATION (ORIF) DISTAL RADIAL FRACTURE Right 01/06/2013   Procedure: RIGHT OPEN REDUCTION INTERNAL FIXATION (ORIF) DISTAL ARTICULATING RADIAL FRACTURE ;  Surgeon: Eulas Post, MD;  Location: [facility];  Service: Orthopedics;  Laterality: Right;  . 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 REVISION OF SCAR TISSUE RECTUS MUSCLE  11/08   right knee  . TUBAL LIGATION      There were no vitals filed for this visit.      Subjective Assessment - 08/22/16 1640    Subjective  Pt last seen 07/20/2016 for OT evaluation for  lymphedema care. Pt has opted out of standard CDT which includes MLD, compression wraps to one leg at a time, ther ex, and skin care. Instead she returns today to explore new technology for sequential pneumatic compression devices (hers is worn out) and to explore options for compression garment replacements.    Pertinent History CVA, DM,    Limitations difficulty walking, decreased sitting and standing tolerance, leg pain and swelling, HYPOTHYROID, impaired instrumental ADLs performance, decreased productive activities at work, restricted social participation; difficulty climbing  and descending stairs and stepping up onto curbs   Patient Stated Goals reduce lymphedema and keep it from getting worse   Currently in Pain? Yes  no change since initial eval   Pain Onset Other (comment)  chronic leg pain, > 20 years             LYMPHEDEMA/ONCOLOGY QUESTIONNAIRE - 08/22/16 1645      Right Lower Extremity Lymphedema   Other RLE ankle-below knee (A-D) limb volume = 4879.46 ml. Knee to groin (D-G) volume = 8557.73. Ankle to groin (A-G)= 13437.19 ml.    Other Limb Volume Differential measures slightly larger at all  landmarks. A-D LVD= 3.77%, R>L. D-G LVD= 0.59%, R>L. A-G LVD= 1.71%, R>L.     Left Lower Extremity Lymphedema   Other  LLE ankle-below knee (A-D) limb volume = 4695.42 ml. Knee to groin (D-G) volume =8512.13 ml. Ankle to groin (A-G)= 13207.54 ml.                  OT Treatments/Exercises (OP) - 08/22/16 0001      ADLs   ADL Education Given Yes     Manual Therapy   Manual Therapy Edema management   Manual therapy comments BLE comparative limb volumetrics                OT Education - 08/22/16 1651    Education provided Yes   Education Details Emphasis of Pt edu for LE self care on indications and contraindications for sequential pneumatic compression devices ( pumps). Pt edu for variety of devices and whi Flexitouch is this OT's device of choice. Educated on  process of trialing device and insurance requirements. PT OKd sharing demographics and insurance info with DME provider.     Person(s) Educated Patient   Methods Explanation;Demonstration;Handout   Comprehension Verbalized understanding;Need further instruction             OT Long Term Goals - 07/24/16 4540      OT LONG TERM GOAL #1   Title Pt independent w/ lymphedema precautions and prevention principals and strategies to limit LE progression and infection risk.   Baseline dependent   Time 1   Period Weeks   Status New     OT LONG TERM GOAL #2   Title Lymphedema (LE) management/ self-care: Pt able to apply multi layered, gradient compression wraps w independently using proper techniques within 2 weeks to achieve optimal limb volume reductions bilaterally.   Baseline dependent   Time 2   Period Weeks   Status New     OT LONG TERM GOAL #3   Title Lymphedema (LE) management/ self-care:  Pt to achieve at least 10% limb volume reductions bilaterally during Intensive CDT to limit LE progression, to reduce pain, and to improve safe ambulation and functional mobility.   Baseline dependent   Time 12   Period Weeks   Status New     OT LONG TERM GOAL #4   Title Lymphedema (LE) management/ self-care:  Pt to tolerate daily compression wraps, garments and devices in keeping w/ prescribed wear regime within 1 week of issue date to progress and retain clinical and functional gains and to limit LE progression.   Baseline dependent   Time 12   Period Weeks   Status New     OT LONG TERM GOAL #5   Title Lymphedema (LE) management/ self-care:  During Management Phase CDT Pt to sustain current limb volumes within 5%, and all other clinical gains achieved during OT treatment with needed level of caregiver assistance to limit LE progression, infection risk and further functional decline.   Baseline dependent   Time 6   Period Months   Status New     OT LONG TERM GOAL #6   Baseline  dependent   Time 6               Plan - 08/22/16 1655    Clinical Impression Statement Having opted out of CDT, Pt returns to OT today to explore latest technology for sequential pneumatic compression devices and custom compression garments. Majority of session devoted to skilled Pt education for  Flexitouch device, which most closely duplicates MLD by incorporating abdominal/ trunkal components when decongesting lymphatic congestion towards core watersheds. Provided printed information and web site for  furth research. BLE comparative limb volumetrics volumetrics reveal nearly symetrical swelling from ankles to groin beteen R and L lower extrameties at all segments.  A-D measurements reveal 3.77% LVD. D-G measurements reveal 0.59% LVD, and A-G volumetrics reveal 1.71% LVD. If both legs were not both equally moderately swollen from ankle to groin, then these values would be WNL.  Cont as per POC. Fit w/ custom compression garments after pump trial.   OT Frequency 1x / week  and PRN   OT Duration 12 weeks   OT Treatment/Interventions Self-care/ADL training;Therapeutic exercise;Patient/family education;Manual Therapy;Manual lymph drainage;Therapeutic exercises;DME and/or AE instruction;Compression bandaging;Therapeutic activities   Consulted and Agree with Plan of Care Patient      Patient will benefit from skilled therapeutic intervention in order to improve the following deficits and impairments:  Abnormal gait, Decreased skin integrity, Decreased knowledge of precautions, Decreased activity tolerance, Decreased knowledge of use of DME, Impaired flexibility, Decreased mobility, Difficulty walking, Decreased range of motion, Increased edema, Pain  Visit Diagnosis: Lymphedema, not elsewhere classified    Problem List Patient Active Problem List   Diagnosis Date Noted  . Hyperkalemia 06/26/2016  . Acquired lymphedema of leg 06/26/2016  . Leg length discrepancy 07/13/2015  . Pain due to  knee joint prosthesis (HCC) 07/13/2015  . Headache 06/28/2015  . Hypothyroidism 01/19/2015  . Right flank pain 01/14/2015  . Fracture of shaft of right radius 01/06/2013  . Palpitations 02/12/2012  . Diabetes (HCC) 01/08/2011  . Encounter for well adult exam with abnormal findings 01/05/2011  . Hypopotassemia 10/18/2010  . Edema 07/26/2010  . SYNCOPE, HX OF 07/03/2010  . OSTEOPENIA 11/01/2009  . Bilateral hearing loss 06/17/2009  . DEPRESSION 01/07/2009  . FATIGUE 01/07/2009  . OBSTRUCTIVE SLEEP APNEA 11/17/2008  . HYPERSOMNIA 10/27/2008  . DIVERTICULOSIS, COLON 06/04/2008  . KNEE PAIN, RIGHT 01/27/2008  . BACK PAIN 01/27/2008  . GOITER, MULTINODULAR 10/08/2007  . HYPERTHYROIDISM 10/08/2007  . MENOPAUSAL SYNDROME 10/08/2007  . Hyperlipidemia 09/19/2007  . ANEMIA-IRON DEFICIENCY 09/19/2007  . Essential hypertension 09/19/2007  . LOW BACK PAIN 09/19/2007  . WEIGHT LOSS 09/19/2007  . COLONIC POLYPS, HX OF 09/19/2007    Loel Dubonnet, MS, OTR/L, [facility] 08/22/16 5:04 PM   [facility] MAIN [facility] SERVICES [address] [place], [place], [address] Phone: [phone]   Fax:  [phone]  Name: Penny Yates MRN: [id number] Date of Birth: [birth year]

## 2016-08-24 ENCOUNTER — Encounter: Payer: Self-pay | Admitting: Occupational Therapy

## 2016-08-28 ENCOUNTER — Telehealth: Payer: Self-pay | Admitting: General Practice

## 2016-08-28 ENCOUNTER — Other Ambulatory Visit: Payer: Self-pay | Admitting: Internal Medicine

## 2016-08-28 NOTE — Telephone Encounter (Signed)
Opened in error

## 2016-08-28 NOTE — Telephone Encounter (Signed)
Patient is requesting Guanfacine 1 mg tablets, 90 day supply.  Last filled 4/6.  Please advise.

## 2016-08-29 MED ORDER — GUANFACINE HCL 1 MG PO TABS: 1.0000 mg | ORAL_TABLET | Freq: Every day | ORAL | 3 refills | Status: DC

## 2016-08-29 NOTE — Telephone Encounter (Signed)
Rec'd fax for refill on Guanfacine 1 mg. Pls advise on refill...Raechel Chute/lmb

## 2016-08-29 NOTE — Telephone Encounter (Signed)
Done erx to [facility] - 90 day

## 2016-09-20 ENCOUNTER — Ambulatory Visit: Payer: BC Managed Care – PPO | Admitting: Occupational Therapy

## 2016-09-20 DIAGNOSIS — I890 Lymphedema, not elsewhere classified: Secondary | ICD-10-CM | POA: Diagnosis not present

## 2016-09-20 NOTE — Therapy (Signed)
[place] [facility]IN [facility] [address] [place], [place], [address] Phone: [phone]   Fax:  [phone]  Occupational Therapy Treatment  Patient Details  Name: Penny BeardsDeborah S Wainwright MRN: [id number] Date of Birth: 01/30/1956 Referring Provider: Corwin LevinsJames W [name], MD  Encounter Date: 09/20/2016      OT End of Session - 09/20/16 1717    Visit Number 2   Number of Visits 36   Date for OT Re-Evaluation 10/18/16   OT Start Time 0215   OT Stop Time 0315   OT Time Calculation (min) 60 min   Activity Tolerance Patient tolerated treatment well;No increased pain   Behavior During Therapy WFL for tasks assessed/performed      Past Medical History:  Diagnosis Date  . Anemia   . Chronic venous insufficiency    LE's  . Depression   . Diabetes mellitus without complication (HCC)   . Fracture of shaft of right radius 01/06/2013  . History of colonic polyps    multiple of succeeding years 96, 97, 98, and 99  . Hyperlipidemia   . Hypertension   . Hyperthyroidism    s/p rad Iodine-ellison  . Hypothyroidism 01/19/2015  . Leg length discrepancy    right leg longer  . Low back pain   . Lymphedema    left arm, both legs; primary lymphedema  . Sacroiliitis (HCC)    history of  . Sciatica     Past Surgical History:  Procedure Laterality Date  . EYE SURGERY     both eyes-muscle repair  . OPEN REDUCTION INTERNAL FIXATION (ORIF) DISTAL RADIAL FRACTURE Right 01/06/2013   Procedure: RIGHT OPEN REDUCTION INTERNAL FIXATION (ORIF) DISTAL ARTICULATING RADIAL FRACTURE ;  Surgeon: Eulas PostJoshua P Landau, MD;  Location: [facility];  Service: Orthopedics;  Laterality: Right;  . 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 REVISION OF SCAR TISSUE RECTUS MUSCLE  11/08   right knee  . TUBAL LIGATION      There were no vitals filed for this visit.      Subjective Assessment - 09/20/16 1714    Subjective  Pt presents for OT vtreatment visit 2/   Pertinent History CVA, DM,    Limitations difficulty walking, decreased sitting and standing tolerance, leg pain and swelling, HYPOTHYROID, impaired instrumental ADLs performance, decreased productive activities at work, restricted social participation; difficulty climbing  and descending stairs and stepping up onto curbs   Patient Stated Goals reduce lymphedema and keep it from getting worse   Pain Onset Other (comment)  chronic leg pain, > 20 years                              OT Education - 09/20/16 1715    Education provided Yes   Education Details discussed reliability and validity of limb measurements for comparative limb circumferences at multiple land marks to measure progress towards volume reduction goals w/ Flexitouch pump.   Person(s) Educated Patient   Methods Explanation   Comprehension Verbalized understanding             OT Long Term Goals - 07/24/16 [id number]      OT LONG TERM GOAL #1   Title Pt independent w/ lymphedema precautions and prevention principals and strategies to limit LE progression and infection risk.   Baseline dependent   Time 1   Period Weeks   Status New     OT  LONG TERM GOAL #2   Title Lymphedema (LE) management/ self-care: Pt able to apply multi layered, gradient compression wraps w independently using proper techniques within 2 weeks to achieve optimal limb volume reductions bilaterally.   Baseline dependent   Time 2   Period Weeks   Status New     OT LONG TERM GOAL #3   Title Lymphedema (LE) management/ self-care:  Pt to achieve at least 10% limb volume reductions bilaterally during Intensive CDT to limit LE progression, to reduce pain, and to improve safe ambulation and functional mobility.   Baseline dependent   Time 12   Period Weeks   Status New     OT LONG TERM GOAL #4   Title Lymphedema (LE) management/ self-care:  Pt to tolerate daily compression wraps, garments and devices in keeping w/ prescribed wear  regime within 1 week of issue date to progress and retain clinical and functional gains and to limit LE progression.   Baseline dependent   Time 12   Period Weeks   Status New     OT LONG TERM GOAL #5   Title Lymphedema (LE) management/ self-care:  During Management Phase CDT Pt to sustain current limb volumes within 5%, and all other clinical gains achieved during OT treatment with needed level of caregiver assistance to limit LE progression, infection risk and further functional decline.   Baseline dependent   Time 6   Period Months   Status New     OT LONG TERM GOAL #6   Baseline dependent   Time 6               Plan - 09/20/16 1719    Clinical Impression Statement Pt brings Flexitouch sequential pneumatic compression pump to clinic for therapist assessment of garment fit. Pt reporting device trial was held in her home several weeks ago and was delivered to her around May 15. She has not yet had trainer contact her  to instruct her on use of device. After speaking to DME regional manager  by phone  vendor promised to have trainer contact Pt ASAP to schedule training. OT expressed concern to vendo that trial was help in home vs clinic , and OT was never informed of trial or decision to order device, let alone ship it to Patient. After Pt edu for proper pump precautions, remainder of session devoted to MLD to LLE in supine using in tact inguinal pathways and deep abdominal lymphatics to move edema to terminus. Pt tolerated without difficulty. Next visit will complete measurements for custom Elvarex compression garments.   Occupational performance deficits (Please refer to evaluation for details): ADL's;Work;Social Participation   OT Frequency 1x / week  and PRN   OT Duration 12 weeks   OT Treatment/Interventions Self-care/ADL training;Therapeutic exercise;Patient/family education;Manual Therapy;Manual lymph drainage;Therapeutic exercises;DME and/or AE instruction;Compression  bandaging;Therapeutic activities   Consulted and Agree with Plan of Care Patient      Patient will benefit from skilled therapeutic intervention in order to improve the following deficits and impairments:  Abnormal gait, Decreased skin integrity, Decreased knowledge of precautions, Decreased activity tolerance, Decreased knowledge of use of DME, Impaired flexibility, Decreased mobility, Difficulty walking, Decreased range of motion, Increased edema, Pain  Visit Diagnosis: Lymphedema, not elsewhere classified    Problem List Patient Active Problem List   Diagnosis Date Noted  . Hyperkalemia 06/26/2016  . Acquired lymphedema of leg 06/26/2016  . Leg length discrepancy 07/13/2015  . 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  Headache 06/28/2015  . Hypothyroidism 01/19/2015  . Right flank pain 01/14/2015  . Fracture of shaft of right radius 01/06/2013  . Palpitations 02/12/2012  . Diabetes (HCC) 01/08/2011  . Encounter for well adult exam with abnormal findings 01/05/2011  . Hypopotassemia 10/18/2010  . Edema 07/26/2010  . SYNCOPE, HX OF 07/03/2010  . OSTEOPENIA 11/01/2009  . Bilateral hearing loss 06/17/2009  . DEPRESSION 01/07/2009  . FATIGUE 01/07/2009  . OBSTRUCTIVE SLEEP APNEA 11/17/2008  . HYPERSOMNIA 10/27/2008  . DIVERTICULOSIS, COLON 06/04/2008  . KNEE PAIN, RIGHT 01/27/2008  . BACK PAIN 01/27/2008  . GOITER, MULTINODULAR 10/08/2007  . HYPERTHYROIDISM 10/08/2007  . MENOPAUSAL SYNDROME 10/08/2007  . Hyperlipidemia 09/19/2007  . ANEMIA-IRON DEFICIENCY 09/19/2007  . Essential hypertension 09/19/2007  . LOW BACK PAIN 09/19/2007  . WEIGHT LOSS 09/19/2007  . COLONIC POLYPS, HX OF 09/19/2007    Loel Dubonnet, MS, OTR/L, [facility] 09/20/16 5:25 PM  [place] [facility] MAIN [facility] SERVICES [address] [place], [place], [address] Phone: [phone]   Fax:  [phone]  Name: ELVERTA DIMICELI MRN: [id number] Date of Birth:  November 11, 1955

## 2016-09-24 ENCOUNTER — Other Ambulatory Visit: Payer: Self-pay | Admitting: Internal Medicine

## 2016-10-02 ENCOUNTER — Ambulatory Visit: Payer: BC Managed Care – PPO | Attending: Internal Medicine | Admitting: Occupational Therapy

## 2016-10-02 DIAGNOSIS — I890 Lymphedema, not elsewhere classified: Secondary | ICD-10-CM | POA: Insufficient documentation

## 2016-10-02 NOTE — Therapy (Signed)
[facility] MAIN [facility] [address] [place], [place], [address] Phone: [phone]   Fax:  [phone]  Occupational Therapy Treatment  Patient Details  Name: Penny BeardsDeborah S Seeber MRN: [id number] Date of Birth: 11/02/1955 Referring Provider: Corwin LevinsJames W [name], MD  Encounter Date: 10/02/2016      OT End of Session - 10/02/16 1631    Visit Number 4   Number of Visits 36   Date for OT Re-Evaluation 10/18/16   OT Start Time 0210   OT Stop Time 0316   OT Time Calculation (min) 66 min   Activity Tolerance Patient tolerated treatment well;No increased pain   Behavior During Therapy WFL for tasks assessed/performed      Past Medical History:  Diagnosis Date  . Anemia   . Chronic venous insufficiency    LE's  . Depression   . Diabetes mellitus without complication (HCC)   . Fracture of shaft of right radius 01/06/2013  . History of colonic polyps    multiple of succeeding years 96, 97, 98, and 99  . Hyperlipidemia   . Hypertension   . Hyperthyroidism    s/p rad Iodine-ellison  . Hypothyroidism 01/19/2015  . Leg length discrepancy    right leg longer  . Low back pain   . Lymphedema    left arm, both legs; primary lymphedema  . Sacroiliitis (HCC)    history of  . Sciatica     Past Surgical History:  Procedure Laterality Date  . EYE SURGERY     both eyes-muscle repair  . OPEN REDUCTION INTERNAL FIXATION (ORIF) DISTAL RADIAL FRACTURE Right 01/06/2013   Procedure: RIGHT OPEN REDUCTION INTERNAL FIXATION (ORIF) DISTAL ARTICULATING RADIAL FRACTURE ;  Surgeon: Eulas PostJoshua P Landau, MD;  Location: [facility];  Service: Orthopedics;  Laterality: Right;  . 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 REVISION OF SCAR TISSUE RECTUS MUSCLE  11/08   right knee  . TUBAL LIGATION      There were no vitals filed for this visit.      Subjective Assessment - 10/02/16 1633    Subjective  Pt presents for OT treatment vist 4 for BLE  lymphedema care. Pt tells me that Flexitouch pump trainer came to her home and set up her pump. We discussed best regime for her being 1 x daily on alternate lags for 1 full cycle. Pt in agreement. Pt pleased with device and vendor customer service.   Pertinent History CVA, DM,    Limitations difficulty walking, decreased sitting and standing tolerance, leg pain and swelling, HYPOTHYROID, impaired instrumental ADLs performance, decreased productive activities at work, restricted social participation; difficulty climbing  and descending stairs and stepping up onto curbs   Patient Stated Goals reduce lymphedema and keep it from getting worse   Currently in Pain? Yes  unchanged leg pain since initial eval. not numerically rated   Pain Onset Other (comment)  chronic leg pain, > 20 years   Pain Frequency Intermittent                              OT Education - 10/02/16 1632    Education provided Yes   Education Details Emphasis of skilled LE self-care training today on  Introductory level edu on individualized compression garment/ device recommendations,  proper fit and function, wear and care regimes, and assistive device options for donning and doffing w/ less physical effort.  Person(s) Educated Patient   Methods Explanation;Demonstration;Handout   Comprehension Verbalized understanding;Need further instruction             OT Long Term Goals - 07/24/16 9604      OT LONG TERM GOAL #1   Title Pt independent w/ lymphedema precautions and prevention principals and strategies to limit LE progression and infection risk.   Baseline dependent   Time 1   Period Weeks   Status New     OT LONG TERM GOAL #2   Title Lymphedema (LE) management/ self-care: Pt able to apply multi layered, gradient compression wraps w independently using proper techniques within 2 weeks to achieve optimal limb volume reductions bilaterally.   Baseline dependent   Time 2   Period Weeks    Status New     OT LONG TERM GOAL #3   Title Lymphedema (LE) management/ self-care:  Pt to achieve at least 10% limb volume reductions bilaterally during Intensive CDT to limit LE progression, to reduce pain, and to improve safe ambulation and functional mobility.   Baseline dependent   Time 12   Period Weeks   Status New     OT LONG TERM GOAL #4   Title Lymphedema (LE) management/ self-care:  Pt to tolerate daily compression wraps, garments and devices in keeping w/ prescribed wear regime within 1 week of issue date to progress and retain clinical and functional gains and to limit LE progression.   Baseline dependent   Time 12   Period Weeks   Status New     OT LONG TERM GOAL #5   Title Lymphedema (LE) management/ self-care:  During Management Phase CDT Pt to sustain current limb volumes within 5%, and all other clinical gains achieved during OT treatment with needed level of caregiver assistance to limit LE progression, infection risk and further functional decline.   Baseline dependent   Time 6   Period Months   Status New     OT LONG TERM GOAL #6   Baseline dependent   Time 6               Plan - 10/02/16 1636    Clinical Impression Statement Completed anatomical measurements for custom Jobst Elvarex compression pantyhose today. Completed leg exctensions, but will take hip, waste and rise for panty portion, as well as complete toe wrap and HOS device measurements next visit. Pt tolerated education and participated in practical aspects of garment specifications.   Occupational performance deficits (Please refer to evaluation for details): ADL's;IADL's;Work;Leisure;Social Participation;Other  body image   OT Frequency 1x / week  and PRN   OT Duration 12 weeks   OT Treatment/Interventions Self-care/ADL training;Therapeutic exercise;Patient/family education;Manual Therapy;Manual lymph drainage;Therapeutic exercises;DME and/or AE instruction;Compression bandaging;Therapeutic  activities   Consulted and Agree with Plan of Care Patient      Patient will benefit from skilled therapeutic intervention in order to improve the following deficits and impairments:  Abnormal gait, Decreased skin integrity, Decreased knowledge of precautions, Decreased activity tolerance, Decreased knowledge of use of DME, Impaired flexibility, Decreased mobility, Difficulty walking, Decreased range of motion, Increased edema, Pain  Visit Diagnosis: Lymphedema, not elsewhere classified    Problem List Patient Active Problem List   Diagnosis Date Noted  . Hyperkalemia 06/26/2016  . Acquired lymphedema of leg 06/26/2016  . Leg length discrepancy 07/13/2015  . 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 Headache 06/28/2015  . Hypothyroidism 01/19/2015  . Right flank pain 01/14/2015  . Fracture of  shaft of right radius 01/06/2013  . Palpitations 02/12/2012  . Diabetes (HCC) 01/08/2011  . Encounter for well adult exam with abnormal findings 01/05/2011  . Hypopotassemia 10/18/2010  . Edema 07/26/2010  . SYNCOPE, HX OF 07/03/2010  . OSTEOPENIA 11/01/2009  . Bilateral hearing loss 06/17/2009  . DEPRESSION 01/07/2009  . FATIGUE 01/07/2009  . OBSTRUCTIVE SLEEP APNEA 11/17/2008  . HYPERSOMNIA 10/27/2008  . DIVERTICULOSIS, COLON 06/04/2008  . KNEE PAIN, RIGHT 01/27/2008  . BACK PAIN 01/27/2008  . GOITER, MULTINODULAR 10/08/2007  . HYPERTHYROIDISM 10/08/2007  . MENOPAUSAL SYNDROME 10/08/2007  . Hyperlipidemia 09/19/2007  . ANEMIA-IRON DEFICIENCY 09/19/2007  . Essential hypertension 09/19/2007  . LOW BACK PAIN 09/19/2007  . WEIGHT LOSS 09/19/2007  . COLONIC POLYPS, HX OF 09/19/2007    Loel Dubonnet, MS, OTR/L, [facility] 10/02/16 4:40 PM  [facility] [place] [facility] MAIN [facility] SERVICES [address] [place], [place], [address] Phone: [phone]   Fax:  [phone]  Name: TAMMERA ENGERT MRN: [id number] Date of Birth: [birth year]

## 2016-10-04 ENCOUNTER — Other Ambulatory Visit: Payer: Self-pay | Admitting: Internal Medicine

## 2016-10-05 ENCOUNTER — Encounter: Payer: Self-pay | Admitting: Internal Medicine

## 2016-10-05 MED ORDER — AMOXICILLIN 500 MG PO CAPS: ORAL_CAPSULE | ORAL | 1 refills | Status: DC

## 2016-10-05 NOTE — Telephone Encounter (Signed)
Done erx 

## 2016-10-09 ENCOUNTER — Ambulatory Visit: Payer: BC Managed Care – PPO | Admitting: Occupational Therapy

## 2016-10-09 DIAGNOSIS — I890 Lymphedema, not elsewhere classified: Secondary | ICD-10-CM | POA: Diagnosis not present

## 2016-10-09 NOTE — Therapy (Signed)
[facility] MAIN [facility] [address] [place], [place], [address] Phone: [phone]   Fax:  [phone]  Occupational Therapy Treatment  Patient Details  Name: Penny BeardsDeborah S Ream MRN: [id number] Date of Birth: 11/09/1955 Referring Provider: Corwin LevinsJames W [name], MD  Encounter Date: 10/09/2016      OT End of Session - 10/09/16 1744    Visit Number 5   Number of Visits 36   Date for OT Re-Evaluation 10/18/16   OT Start Time 0215   OT Stop Time 0320   OT Time Calculation (min) 65 min   Activity Tolerance Patient tolerated treatment well;No increased pain   Behavior During Therapy WFL for tasks assessed/performed      Past Medical History:  Diagnosis Date  . Anemia   . Chronic venous insufficiency    LE's  . Depression   . Diabetes mellitus without complication (HCC)   . Fracture of shaft of right radius 01/06/2013  . History of colonic polyps    multiple of succeeding years 96, 97, 98, and 99  . Hyperlipidemia   . Hypertension   . Hyperthyroidism    s/p rad Iodine-ellison  . Hypothyroidism 01/19/2015  . Leg length discrepancy    right leg longer  . Low back pain   . Lymphedema    left arm, both legs; primary lymphedema  . Sacroiliitis (HCC)    history of  . Sciatica     Past Surgical History:  Procedure Laterality Date  . EYE SURGERY     both eyes-muscle repair  . OPEN REDUCTION INTERNAL FIXATION (ORIF) DISTAL RADIAL FRACTURE Right 01/06/2013   Procedure: RIGHT OPEN REDUCTION INTERNAL FIXATION (ORIF) DISTAL ARTICULATING RADIAL FRACTURE ;  Surgeon: Eulas PostJoshua P Landau, MD;  Location: [facility];  Service: Orthopedics;  Laterality: Right;  . 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 REVISION OF SCAR TISSUE RECTUS MUSCLE  11/08   right knee  . TUBAL LIGATION      There were no vitals filed for this visit.      Subjective Assessment - 10/09/16 1740    Subjective  Pt presents for OT treatment vist 5 for BLE  lymphedema care. Pt tells me that Flexitouch device vendor called to check in with her a few days ago.   Pertinent History CVA, DM,    Limitations difficulty walking, decreased sitting and standing tolerance, leg pain and swelling, HYPOTHYROID, impaired instrumental ADLs performance, decreased productive activities at work, restricted social participation; difficulty climbing  and descending stairs and stepping up onto curbs   Patient Stated Goals reduce lymphedema and keep it from getting worse   Currently in Pain? No/denies   Pain Onset Other (comment)  chronic leg pain, > 20 years                      OT Treatments/Exercises (OP) - 10/09/16 0001      ADLs   ADL Education Given Yes     Manual Therapy   Manual Therapy Edema management   Manual therapy comments Completed BLE custom compression garment and HOS devices measurments for BLE , ccl 2, custom flat knit, compression pantyhose, BLE custon , ccl 2 toe caps, and BL, custom Jobst relax thigh high HOS devices needed to decrease tissue fibrosis and to facilitate improved lymphatic circulation during periods of low activity , including HOS.  OT Education - 10/09/16 1741    Education provided Yes   Education Details Provided Pt/caregiver skilled education and ADL training throughout visit for lymphedema etiology, progression, and treatment including Intensive and Management Phase Complete Decongestive Therapy (CDT)  Discussed lymphedema precautions, cellulitis risk, and all CDT and LE self-care components, including compression wrapping/ garments & devices, lymphatic pumping ther ex, simple self-MLD, and skin care. Provided printed Lymphedema Workbook for reference.   Person(s) Educated Patient   Methods Explanation;Demonstration   Comprehension Verbalized understanding;Returned demonstration             OT Long Term Goals - 07/24/16 0952      OT LONG TERM GOAL #1   Title Pt independent w/  lymphedema precautions and prevention principals and strategies to limit LE progression and infection risk.   Baseline dependent   Time 1   Period Weeks   Status New     OT LONG TERM GOAL #2   Title Lymphedema (LE) management/ self-care: Pt able to apply multi layered, gradient compression wraps w independently using proper techniques within 2 weeks to achieve optimal limb volume reductions bilaterally.   Baseline dependent   Time 2   Period Weeks   Status New     OT LONG TERM GOAL #3   Title Lymphedema (LE) management/ self-care:  Pt to achieve at least 10% limb volume reductions bilaterally during Intensive CDT to limit LE progression, to reduce pain, and to improve safe ambulation and functional mobility.   Baseline dependent   Time 12   Period Weeks   Status New     OT LONG TERM GOAL #4   Title Lymphedema (LE) management/ self-care:  Pt to tolerate daily compression wraps, garments and devices in keeping w/ prescribed wear regime within 1 week of issue date to progress and retain clinical and functional gains and to limit LE progression.   Baseline dependent   Time 12   Period Weeks   Status New     OT LONG TERM GOAL #5   Title Lymphedema (LE) management/ self-care:  During Management Phase CDT Pt to sustain current limb volumes within 5%, and all other clinical gains achieved during OT treatment with needed level of caregiver assistance to limit LE progression, infection risk and further functional decline.   Baseline dependent   Time 6   Period Months   Status New     OT LONG TERM GOAL #6   Baseline dependent   Time 6               Plan - 10/09/16 1744    Clinical Impression Statement Finished  anatomical measurements for BLE custom, ccl 2 toe caps and Jobst Relax HOS devices.  Pt tolerated and engaged in all aspects of OT without difficulty. Cont as per POC.   OT Frequency 1x / week  and PRN   OT Duration 12 weeks   OT Treatment/Interventions Self-care/ADL  training;Therapeutic exercise;Patient/family education;Manual Therapy;Manual lymph drainage;Therapeutic exercises;DME and/or AE instruction;Compression bandaging;Therapeutic activities   Consulted and Agree with Plan of Care Patient      Patient will benefit from skilled therapeutic intervention in order to improve the following deficits and impairments:  Abnormal gait, Decreased skin integrity, Decreased knowledge of precautions, Decreased activity tolerance, Decreased knowledge of use of DME, Impaired flexibility, Decreased mobility, Difficulty walking, Decreased range of motion, Increased edema, Pain  Visit Diagnosis: Lymphedema, not elsewhere classified    Problem List Patient Active Problem List   Diagnosis Date Noted  .  Hyperkalemia 06/26/2016  . Acquired lymphedema of leg 06/26/2016  . Leg length discrepancy 07/13/2015  . 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 Headache 06/28/2015  . Hypothyroidism 01/19/2015  . Right flank pain 01/14/2015  . Fracture of shaft of right radius 01/06/2013  . Palpitations 02/12/2012  . Diabetes (HCC) 01/08/2011  . Encounter for well adult exam with abnormal findings 01/05/2011  . Hypopotassemia 10/18/2010  . Edema 07/26/2010  . SYNCOPE, HX OF 07/03/2010  . OSTEOPENIA 11/01/2009  . Bilateral hearing loss 06/17/2009  . DEPRESSION 01/07/2009  . FATIGUE 01/07/2009  . OBSTRUCTIVE SLEEP APNEA 11/17/2008  . HYPERSOMNIA 10/27/2008  . DIVERTICULOSIS, COLON 06/04/2008  . KNEE PAIN, RIGHT 01/27/2008  . BACK PAIN 01/27/2008  . GOITER, MULTINODULAR 10/08/2007  . HYPERTHYROIDISM 10/08/2007  . MENOPAUSAL SYNDROME 10/08/2007  . Hyperlipidemia 09/19/2007  . ANEMIA-IRON DEFICIENCY 09/19/2007  . Essential hypertension 09/19/2007  . LOW BACK PAIN 09/19/2007  . WEIGHT LOSS 09/19/2007  . COLONIC POLYPS, HX OF 09/19/2007    Loel Dubonnet, MS, OTR/L, [facility] 10/09/16 5:46 PM  [facility] MAIN [facility]  SERVICES [address] [place], [place], [address] Phone: [phone]   Fax:  [phone]  Name: NARIAH MORGANO MRN: [id number] Date of Birth: 02-14-1956

## 2016-10-16 ENCOUNTER — Ambulatory Visit: Payer: BC Managed Care – PPO | Admitting: Occupational Therapy

## 2016-10-29 ENCOUNTER — Ambulatory Visit: Payer: BC Managed Care – PPO | Attending: Internal Medicine | Admitting: Occupational Therapy

## 2016-10-29 DIAGNOSIS — I890 Lymphedema, not elsewhere classified: Secondary | ICD-10-CM | POA: Insufficient documentation

## 2016-10-29 NOTE — Therapy (Signed)
[place] [facility] MAIN [facility] SERVICES [address] [place], [place], [address] Phone: [phone]   Fax:  [phone]  Occupational Therapy Treatment  Patient Details  Name: Penny Yates MRN: [id number] Date of Birth: 12/11/1955 Referring Provider: Corwin Levins, MD  Encounter Date: 10/29/2016      OT End of Session - 10/29/16 1703    Visit Number 6   Number of Visits 36   Date for OT Re-Evaluation 10/18/16   OT Start Time 0322   OT Stop Time 0422   OT Time Calculation (min) 60 min   Activity Tolerance Patient tolerated treatment well;No increased pain   Behavior During Therapy WFL for tasks assessed/performed      Past Medical History:  Diagnosis Date  . Anemia   . Chronic venous insufficiency    LE's  . Depression   . Diabetes mellitus without complication (HCC)   . Fracture of shaft of right radius 01/06/2013  . History of colonic polyps    multiple of succeeding years 96, 97, 98, and 99  . Hyperlipidemia   . Hypertension   . Hyperthyroidism    s/p rad Iodine-ellison  . Hypothyroidism 01/19/2015  . Leg length discrepancy    right leg longer  . Low back pain   . Lymphedema    left arm, both legs; primary lymphedema  . Sacroiliitis (HCC)    history of  . Sciatica     Past Surgical History:  Procedure Laterality Date  . EYE SURGERY     both eyes-muscle repair  . OPEN REDUCTION INTERNAL FIXATION (ORIF) DISTAL RADIAL FRACTURE Right 01/06/2013   Procedure: RIGHT OPEN REDUCTION INTERNAL FIXATION (ORIF) DISTAL ARTICULATING RADIAL FRACTURE ;  Surgeon: Eulas Post, MD;  Location: [facility];  Service: Orthopedics;  Laterality: Right;  . 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 REVISION OF SCAR TISSUE RECTUS MUSCLE  11/08   right knee  . TUBAL LIGATION      There were no vitals filed for this visit.      Subjective Assessment - 10/29/16 1659    Subjective  Pt presents for OT treatment vist 6 for BLE  lymphedema care. Pt reports that she has decreased frequency of Flexitouch use from everyother day to alternating legs, to q 2 days on, 2 days off. "I feel like it's too much sometimes, like it's backing up."   Pertinent History CVA, DM,    Limitations difficulty walking, decreased sitting and standing tolerance, leg pain and swelling, HYPOTHYROID, impaired instrumental ADLs performance, decreased productive activities at work, restricted social participation; difficulty climbing  and descending stairs and stepping up onto curbs   Patient Stated Goals reduce lymphedema and keep it from getting worse   Currently in Pain? No/denies   Pain Onset Other (comment)  chronic leg pain, > 20 years                      OT Treatments/Exercises (OP) - 10/29/16 0001      ADLs   ADL Education Given Yes     Manual Therapy   Manual Therapy Edema management;Manual Lymphatic Drainage (MLD)   Manual Lymphatic Drainage (MLD) Manual lymph drainage (MLD) to RLE in supine utilizing functional inguinal lymph nodes and deep abdominal lymphatics as is customary for non-cancer related lower extremity LE, including bilateral "short neck" sequence, J strokes to sub and supraclavicular LN, deep abdominal pathways, functional inguinal LN, lower extremity proximal  to distal w/ emphasis on medial knee bottleneck and politeal LN. Performed fibrosis technique to B maleoli and distal  legs to address fatty fibrosis.                OT Education - 10/29/16 1702    Education provided Yes   Education Details Pt edu for LE self care re how to incorporate short neck sequence, deep abdominal core lymphatics MLD and lymphatic pumping into pump use at home to increase effectivenness.   Person(s) Educated Patient   Methods Explanation;Demonstration   Comprehension Verbalized understanding             OT Long Term Goals - 07/24/16 [id number]      OT LONG TERM GOAL #1   Title Pt independent w/ lymphedema  precautions and prevention principals and strategies to limit LE progression and infection risk.   Baseline dependent   Time 1   Period Weeks   Status New     OT LONG TERM GOAL #2   Title Lymphedema (LE) management/ self-care: Pt able to apply multi layered, gradient compression wraps w independently using proper techniques within 2 weeks to achieve optimal limb volume reductions bilaterally.   Baseline dependent   Time 2   Period Weeks   Status New     OT LONG TERM GOAL #3   Title Lymphedema (LE) management/ self-care:  Pt to achieve at least 10% limb volume reductions bilaterally during Intensive CDT to limit LE progression, to reduce pain, and to improve safe ambulation and functional mobility.   Baseline dependent   Time 12   Period Weeks   Status New     OT LONG TERM GOAL #4   Title Lymphedema (LE) management/ self-care:  Pt to tolerate daily compression wraps, garments and devices in keeping w/ prescribed wear regime within 1 week of issue date to progress and retain clinical and functional gains and to limit LE progression.   Baseline dependent   Time 12   Period Weeks   Status New     OT LONG TERM GOAL #5   Title Lymphedema (LE) management/ self-care:  During Management Phase CDT Pt to sustain current limb volumes within 5%, and all other clinical gains achieved during OT treatment with needed level of caregiver assistance to limit LE progression, infection risk and further functional decline.   Baseline dependent   Time 6   Period Months   Status New     OT LONG TERM GOAL #6   Baseline dependent   Time 6               Plan - 10/29/16 1704    Clinical Impression Statement Pt has yet to get quote from DME vendor for custom daytime compression garments and HOS devices, so called dem vendor and requested documents faxed to clinic. These were reviewed w/ Pt and she will complete and fax from home. Pt tolerated MLD to RLE without difficulty. Pt verbalized  understanding of incorporating MLD and ther ex into pump use to improved effectiveness. Pt OK w/ alrernating legs q 2 days. Cont as per POC.   OT Frequency 1x / week  and PRN   OT Duration 12 weeks   OT Treatment/Interventions Self-care/ADL training;Therapeutic exercise;Patient/family education;Manual Therapy;Manual lymph drainage;Therapeutic exercises;DME and/or AE instruction;Compression bandaging;Therapeutic activities   Consulted and Agree with Plan of Care Patient      Patient will benefit from skilled therapeutic intervention in order to improve the following deficits and impairments:  Abnormal  gait, Decreased skin integrity, Decreased knowledge of precautions, Decreased activity tolerance, Decreased knowledge of use of DME, Impaired flexibility, Decreased mobility, Difficulty walking, Decreased range of motion, Increased edema, Pain  Visit Diagnosis: Lymphedema, not elsewhere classified    Problem List Patient Active Problem List   Diagnosis Date Noted  . Hyperkalemia 06/26/2016  . Acquired lymphedema of leg 06/26/2016  . Leg length discrepancy 07/13/2015  . 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 Headache 06/28/2015  . Hypothyroidism 01/19/2015  . Right flank pain 01/14/2015  . Fracture of shaft of right radius 01/06/2013  . Palpitations 02/12/2012  . Diabetes (HCC) 01/08/2011  . Encounter for well adult exam with abnormal findings 01/05/2011  . Hypopotassemia 10/18/2010  . Edema 07/26/2010  . SYNCOPE, HX OF 07/03/2010  . OSTEOPENIA 11/01/2009  . Bilateral hearing loss 06/17/2009  . DEPRESSION 01/07/2009  . FATIGUE 01/07/2009  . OBSTRUCTIVE SLEEP APNEA 11/17/2008  . HYPERSOMNIA 10/27/2008  . DIVERTICULOSIS, COLON 06/04/2008  . KNEE PAIN, RIGHT 01/27/2008  . BACK PAIN 01/27/2008  . GOITER, MULTINODULAR 10/08/2007  . HYPERTHYROIDISM 10/08/2007  . MENOPAUSAL SYNDROME 10/08/2007  . Hyperlipidemia 09/19/2007  . ANEMIA-IRON DEFICIENCY 09/19/2007  .  Essential hypertension 09/19/2007  . LOW BACK PAIN 09/19/2007  . WEIGHT LOSS 09/19/2007  . COLONIC POLYPS, HX OF 09/19/2007   Loel Dubonnet, MS, OTR/L, [facility] 10/29/16 5:07 PM   [place] [facility] MAIN [facility] SERVICES [address] [place], [place], [address] Phone: [phone]   Fax:  [phone]  Name: ZORAIDA HAVRILLA MRN: [id number] Date of Birth: Jun 29, 1955

## 2016-11-13 ENCOUNTER — Ambulatory Visit: Payer: BC Managed Care – PPO | Admitting: Occupational Therapy

## 2016-11-29 ENCOUNTER — Encounter: Payer: Self-pay | Admitting: Internal Medicine

## 2016-11-29 ENCOUNTER — Other Ambulatory Visit: Payer: Self-pay | Admitting: Internal Medicine

## 2016-11-29 NOTE — Telephone Encounter (Signed)
shirron to see above 

## 2016-12-13 ENCOUNTER — Encounter: Payer: Self-pay | Admitting: Internal Medicine

## 2016-12-13 NOTE — Telephone Encounter (Signed)
Please assist pt with obtaining Nurse Visit appt at her appt time requested

## 2016-12-14 ENCOUNTER — Other Ambulatory Visit (INDEPENDENT_AMBULATORY_CARE_PROVIDER_SITE_OTHER): Payer: BC Managed Care – PPO

## 2016-12-14 DIAGNOSIS — E119 Type 2 diabetes mellitus without complications: Secondary | ICD-10-CM

## 2016-12-14 DIAGNOSIS — E039 Hypothyroidism, unspecified: Secondary | ICD-10-CM | POA: Diagnosis not present

## 2016-12-14 LAB — BASIC METABOLIC PANEL
BUN: 15 mg/dL (ref 6–23)
CO2: 34 meq/L — AB (ref 19–32)
CREATININE: 1 mg/dL (ref 0.40–1.20)
Calcium: 10 mg/dL (ref 8.4–10.5)
Chloride: 103 mEq/L (ref 96–112)
GFR: 71.98 mL/min (ref >60.00)
GLUCOSE: 124 mg/dL — AB (ref 70–99)
Potassium: 4.2 mEq/L (ref 3.5–5.1)
SODIUM: 142 meq/L (ref 135–145)

## 2016-12-14 LAB — HEPATIC FUNCTION PANEL
ALT: 13 U/L (ref 0–35)
AST: 15 U/L (ref 0–37)
Albumin: 3.8 g/dL (ref 3.5–5.2)
Alkaline Phosphatase: 84 U/L (ref 39–117)
BILIRUBIN DIRECT: 0.2 mg/dL (ref 0.0–0.3)
TOTAL PROTEIN: 6.9 g/dL (ref 6.0–8.3)
Total Bilirubin: 0.6 mg/dL (ref 0.2–1.2)

## 2016-12-14 LAB — LIPID PANEL
CHOL/HDL RATIO: 2
CHOLESTEROL: 169 mg/dL (ref 0–200)
HDL: 81.1 mg/dL (ref >39.00)
LDL Cholesterol: 78 mg/dL (ref 0–99)
NonHDL: 88.35
TRIGLYCERIDES: 51 mg/dL (ref 0.0–149.0)
VLDL: 10.2 mg/dL (ref 0.0–40.0)

## 2016-12-14 LAB — TSH: TSH: 2.71 u[IU]/mL (ref 0.35–4.50)

## 2016-12-14 LAB — T4, FREE: FREE T4: 1.3 ng/dL (ref 0.60–1.60)

## 2016-12-14 LAB — HEMOGLOBIN A1C: HEMOGLOBIN A1C: 5.7 % (ref 4.6–6.5)

## 2016-12-18 ENCOUNTER — Ambulatory Visit (INDEPENDENT_AMBULATORY_CARE_PROVIDER_SITE_OTHER): Payer: BC Managed Care – PPO | Admitting: General Practice

## 2016-12-18 VITALS — BP 178/98

## 2016-12-18 DIAGNOSIS — Z23 Encounter for immunization: Secondary | ICD-10-CM | POA: Diagnosis not present

## 2016-12-18 NOTE — Progress Notes (Signed)
Medical screening examination/treatment/procedure(s) were performed by non-physician practitioner and as supervising physician I was immediately available for consultation/collaboration. I agree with above. Fiona Coto, MD   

## 2016-12-27 ENCOUNTER — Encounter: Payer: Self-pay | Admitting: Internal Medicine

## 2016-12-27 ENCOUNTER — Ambulatory Visit (INDEPENDENT_AMBULATORY_CARE_PROVIDER_SITE_OTHER): Payer: BC Managed Care – PPO | Admitting: Internal Medicine

## 2016-12-27 VITALS — BP 156/98 | HR 60 | Temp 98.2°F | Ht 68.0 in | Wt 167.0 lb

## 2016-12-27 DIAGNOSIS — I10 Essential (primary) hypertension: Secondary | ICD-10-CM | POA: Diagnosis not present

## 2016-12-27 DIAGNOSIS — E119 Type 2 diabetes mellitus without complications: Secondary | ICD-10-CM

## 2016-12-27 DIAGNOSIS — E785 Hyperlipidemia, unspecified: Secondary | ICD-10-CM | POA: Diagnosis not present

## 2016-12-27 DIAGNOSIS — E876 Hypokalemia: Secondary | ICD-10-CM | POA: Diagnosis not present

## 2016-12-27 MED ORDER — GUANFACINE HCL 2 MG PO TABS: 2.0000 mg | ORAL_TABLET | Freq: Every day | ORAL | 3 refills | Status: DC

## 2016-12-27 NOTE — Assessment & Plan Note (Signed)
Mild uncontrolled - for increased tenex to 2 mg daily, cont to follow BP at home and next visit

## 2016-12-27 NOTE — Patient Instructions (Addendum)
Ok to increase the tenex to 2 mg per day  Please continue all other medications as before, and refills have been done if requested.  Please have the pharmacy call with any other refills you may need.  Please continue your efforts at being more active, low cholesterol diet, and weight control.  You are otherwise up to date with prevention measures today.  Please keep your appointments with your specialists as you may have planned  Please return in 6 months, or sooner if needed, with Lab testing done 3-5 days before

## 2016-12-27 NOTE — Assessment & Plan Note (Signed)
stable overall by history and exam, recent data reviewed with pt, and pt to continue medical treatment as before,  to f/u any worsening symptoms or concerns  

## 2016-12-27 NOTE — Progress Notes (Signed)
Subjective:    Patient ID: Penny Yates, female    DOB: 11/30/1955, 61 y.o.   MRN: [id number]  HPI  Here to f/u; overall doing ok,  Pt denies chest pain, increasing sob or doe, wheezing, orthopnea, PND, increased LE swelling, palpitations, dizziness or syncope.  Pt denies new neurological symptoms such as new headache, or facial or extremity weakness or numbness.  Pt denies polydipsia, polyuria, or low sugar episode.  Pt states overall good compliance with meds, mostly trying to follow appropriate diet, with wt overall stable, did see the lymphedema tx provider oin [place], seems to be helping, but cost to her is over $200 per session.  But now has it worked out with [employer] insurance company that she has been able to get lymphedema pump, and the compression stockings.  Mentions Bp at flu shot last wk was 170/102, today elevated as well.  Does have some increased pop and click of the right knee s/p TKR x 11 yrs Past Medical History:  Diagnosis Date  . Anemia   . Chronic venous insufficiency    LE's  . Depression   . Diabetes mellitus without complication (HCC)   . Fracture of shaft of right radius 01/06/2013  . History of colonic polyps    multiple of succeeding years 96, 97, 98, and 99  . Hyperlipidemia   . Hypertension   . Hyperthyroidism    s/p rad Iodine-ellison  . Hypothyroidism 01/19/2015  . Leg length discrepancy    right leg longer  . Low back pain   . Lymphedema    left arm, both legs; primary lymphedema  . Sacroiliitis (HCC)    history of  . Sciatica    Past Surgical History:  Procedure Laterality Date  . EYE SURGERY     both eyes-muscle repair  . OPEN REDUCTION INTERNAL FIXATION (ORIF) DISTAL RADIAL FRACTURE Right 01/06/2013   Procedure: RIGHT OPEN REDUCTION INTERNAL FIXATION (ORIF) DISTAL ARTICULATING RADIAL FRACTURE ;  Surgeon: Eulas PostJoshua P Landau, MD;  Location: [facility];  Service: Orthopedics;  Laterality: Right;  . 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 REVISION OF SCAR TISSUE RECTUS MUSCLE  11/08   right knee  . TUBAL LIGATION      reports that she has never smoked. She has never used smokeless tobacco. She reports that she does not drink alcohol or use drugs. family history includes Cancer in her mother; Colon cancer (age of onset: 2054) in her mother; Diabetes in her mother and unknown relative; Heart disease in her maternal grandmother; Kidney disease in her unknown relative; Stroke in her unknown relative. Allergies  Allergen Reactions  . Ace Inhibitors Cough  . Lisinopril     Cough   Current Outpatient Prescriptions on File Prior to Visit  Medication Sig Dispense Refill  . ALPHAGAN P 0.1 % SOLN INSTILL 1 DROP INTO BOTH EYES TWICE A DAY  11  . aspirin 81 MG tablet Take 81 mg by mouth daily.      Marland Kitchen. atorvastatin (LIPITOR) 10 MG tablet TAKE 1 TABLET BY MOUTH EVERY DAY 90 tablet 1  . Biotin 5000 MCG CAPS Take by mouth.      . Calcium Carbonate-Vitamin D 300-100 MG-UNIT CAPS Take by mouth. Take one daily      . diclofenac sodium (VOLTAREN) 1 % GEL Apply 2 g topically 4 (four) times daily. To affected joint. 100 g 11  . diltiazem (CARDIZEM CD) 360 MG 24 hr capsule TAKE 1  CAPSULE (360 MG TOTAL) BY MOUTH DAILY. 90 capsule 1  . Dorzolamide HCl-Timolol Mal PF 22.3-6.8 MG/ML SOLN Apply 1 drop to eye 2 (two) times daily. 1 drop in each eye twice a day.    . fish oil-omega-3 fatty acids 1000 MG capsule Take 1 capsule by mouth daily.     . furosemide (LASIX) 40 MG tablet TAKE 1 TABLET BY MOUTH EVERY DAY 90 tablet 2  . guanFACINE (TENEX) 1 MG tablet Take 1 tablet (1 mg total) by mouth at bedtime. 90 tablet 3  . irbesartan (AVAPRO) 300 MG tablet Take 1 tablet (300 mg total) by mouth daily. 90 tablet 2  . levothyroxine (SYNTHROID) 75 MCG tablet Take 1 tablet (75 mcg total) by mouth daily before breakfast. 90 tablet 3  . meloxicam (MOBIC) 7.5 MG tablet TAKE 1 TABLET BY MOUTH EVERY DAY 30 tablet 0  . montelukast (SINGULAIR) 10 MG tablet Take  1 tablet (10 mg total) by mouth at bedtime. 90 tablet 3  . Multiple Vitamin (MULTIVITAMIN) capsule Take 1 capsule by mouth daily.      . potassium chloride (K-DUR,KLOR-CON) 10 MEQ tablet Take 1 tablet (10 mEq total) by mouth daily. 90 tablet 3  . traMADol (ULTRAM-ER) 200 MG 24 hr tablet TAKE 1 TABLET BY MOUTH DAILY AS NEEDED FOR PAIN 30 tablet 5   No current facility-administered medications on file prior to visit.    Review of Systems  Constitutional: Negative for other unusual diaphoresis or sweats HENT: Negative for ear discharge or swelling Eyes: Negative for other worsening visual disturbances Respiratory: Negative for stridor or other swelling  Gastrointestinal: Negative for worsening distension or other blood Genitourinary: Negative for retention or other urinary change Musculoskeletal: Negative for other MSK pain or swelling Skin: Negative for color change or other new lesions Neurological: Negative for worsening tremors and other numbness  Psychiatric/Behavioral: Negative for worsening agitation or other fatigue All other system neg per pt    Objective:   Physical Exam BP (!) 156/98   Pulse 60   Temp 98.2 F (36.8 C) (Oral)   Ht  (1.727 m)   Wt 167 lb (75.8 kg)   SpO2 97%   BMI 25.39 kg/m  VS noted,  Constitutional: Pt appears in NAD HENT: Head: NCAT.  Right Ear: External ear normal.  Left Ear: External ear normal.  Eyes: . Pupils are equal, round, and reactive to light. Conjunctivae and EOM are normal Nose: without d/c or deformity Neck: Neck supple. Gross normal ROM Cardiovascular: Normal rate and regular rhythm.   Pulmonary/Chest: Effort normal and breath sounds without rales or wheezing.  Neurological: Pt is alert. At baseline orientation, motor grossly intact Skin: Skin is warm. No rashes, other new lesions, no LE edema Psychiatric: Pt behavior is normal without agitation  No other exam finings Lab Results  Component Value Date   WBC 6.0 06/25/2016    HGB 13.1 06/25/2016   HCT 38.1 06/25/2016   PLT 255.0 06/25/2016   GLUCOSE 124 (H) 12/14/2016   CHOL 169 12/14/2016   TRIG 51.0 12/14/2016   HDL 81.10 12/14/2016   LDLDIRECT 131.2 10/27/2008   LDLCALC 78 12/14/2016   ALT 13 12/14/2016   AST 15 12/14/2016   NA 142 12/14/2016   K 4.2 12/14/2016   CL 103 12/14/2016   CREATININE 1.00 12/14/2016   BUN 15 12/14/2016   CO2 34 (H) 12/14/2016   TSH 2.71 12/14/2016   HGBA1C 5.7 12/14/2016   MICROALBUR 1.5 06/25/2016  Assessment & Plan:

## 2017-01-03 ENCOUNTER — Other Ambulatory Visit: Payer: Self-pay | Admitting: Internal Medicine

## 2017-01-03 NOTE — Telephone Encounter (Signed)
faxed

## 2017-01-03 NOTE — Telephone Encounter (Signed)
Done hardcopy to Shirron  

## 2017-03-25 ENCOUNTER — Other Ambulatory Visit: Payer: Self-pay | Admitting: Internal Medicine

## 2017-03-26 ENCOUNTER — Encounter: Payer: Self-pay | Admitting: Internal Medicine

## 2017-03-27 MED ORDER — AMOXICILLIN 500 MG PO CAPS: 2000.0000 mg | ORAL_CAPSULE | Freq: Once | ORAL | 0 refills | Status: AC

## 2017-05-30 ENCOUNTER — Other Ambulatory Visit: Payer: Self-pay | Admitting: Internal Medicine

## 2017-06-04 LAB — HM DIABETES EYE EXAM

## 2017-06-14 ENCOUNTER — Other Ambulatory Visit: Payer: Self-pay | Admitting: Internal Medicine

## 2017-06-18 ENCOUNTER — Other Ambulatory Visit: Payer: Self-pay

## 2017-06-18 MED ORDER — DILTIAZEM HCL ER COATED BEADS 360 MG PO CP24: ORAL_CAPSULE | ORAL | 0 refills | Status: DC

## 2017-06-18 MED ORDER — FUROSEMIDE 40 MG PO TABS: 40.0000 mg | ORAL_TABLET | Freq: Every day | ORAL | 2 refills | Status: DC

## 2017-06-24 ENCOUNTER — Other Ambulatory Visit (INDEPENDENT_AMBULATORY_CARE_PROVIDER_SITE_OTHER): Payer: BC Managed Care – PPO

## 2017-06-24 DIAGNOSIS — E119 Type 2 diabetes mellitus without complications: Secondary | ICD-10-CM

## 2017-06-24 LAB — CBC WITH DIFFERENTIAL/PLATELET
BASOS ABS: 0.1 10*3/uL (ref 0.0–0.1)
BASOS PCT: 0.8 % (ref 0.0–3.0)
Eosinophils Absolute: 0.1 10*3/uL (ref 0.0–0.7)
Eosinophils Relative: 1.9 % (ref 0.0–5.0)
HEMATOCRIT: 37.8 % (ref 36.0–46.0)
Hemoglobin: 13 g/dL (ref 12.0–15.0)
LYMPHS PCT: 49.4 % — AB (ref 12.0–46.0)
Lymphs Abs: 3.3 10*3/uL (ref 0.7–4.0)
MCHC: 34.5 g/dL (ref 30.0–36.0)
MCV: 84.4 fl (ref 78.0–100.0)
Monocytes Absolute: 0.4 10*3/uL (ref 0.1–1.0)
Monocytes Relative: 6.7 % (ref 3.0–12.0)
NEUTROS ABS: 2.7 10*3/uL (ref 1.4–7.7)
Neutrophils Relative %: 41.2 % — ABNORMAL LOW (ref 43.0–77.0)
PLATELETS: 250 10*3/uL (ref 150.0–400.0)
RBC: 4.48 Mil/uL (ref 3.87–5.11)
RDW: 15.9 % — ABNORMAL HIGH (ref 11.5–15.5)
WBC: 6.6 10*3/uL (ref 4.0–10.5)

## 2017-06-24 LAB — URINALYSIS, ROUTINE W REFLEX MICROSCOPIC
BILIRUBIN URINE: NEGATIVE
HGB URINE DIPSTICK: NEGATIVE
Leukocytes, UA: NEGATIVE
NITRITE: NEGATIVE
RBC / HPF: NONE SEEN (ref 0–?)
Specific Gravity, Urine: 1.02 (ref 1.000–1.030)
TOTAL PROTEIN, URINE-UPE24: NEGATIVE
URINE GLUCOSE: NEGATIVE
UROBILINOGEN UA: 0.2 (ref 0.0–1.0)
pH: 6.5 (ref 5.0–8.0)

## 2017-06-24 LAB — MICROALBUMIN / CREATININE URINE RATIO
Creatinine,U: 259.5 mg/dL
Microalb Creat Ratio: 1.8 mg/g (ref 0.0–30.0)
Microalb, Ur: 4.7 mg/dL — ABNORMAL HIGH (ref 0.0–1.9)

## 2017-06-24 LAB — LIPID PANEL
CHOLESTEROL: 164 mg/dL (ref 0–200)
HDL: 89.1 mg/dL (ref >39.00)
LDL CALC: 66 mg/dL (ref 0–99)
NonHDL: 74.88
TRIGLYCERIDES: 45 mg/dL (ref 0.0–149.0)
Total CHOL/HDL Ratio: 2
VLDL: 9 mg/dL (ref 0.0–40.0)

## 2017-06-24 LAB — TSH: TSH: 1.96 u[IU]/mL (ref 0.35–4.50)

## 2017-06-24 LAB — BASIC METABOLIC PANEL
BUN: 18 mg/dL (ref 6–23)
CALCIUM: 10.7 mg/dL — AB (ref 8.4–10.5)
CHLORIDE: 102 meq/L (ref 96–112)
CO2: 32 mEq/L (ref 19–32)
CREATININE: 0.98 mg/dL (ref 0.40–1.20)
GFR: 73.56 mL/min (ref >60.00)
Glucose, Bld: 105 mg/dL — ABNORMAL HIGH (ref 70–99)
Potassium: 4.4 mEq/L (ref 3.5–5.1)
SODIUM: 143 meq/L (ref 135–145)

## 2017-06-24 LAB — HEMOGLOBIN A1C: Hgb A1c MFr Bld: 5.7 % (ref 4.6–6.5)

## 2017-06-24 LAB — HEPATIC FUNCTION PANEL
ALBUMIN: 3.8 g/dL (ref 3.5–5.2)
ALK PHOS: 96 U/L (ref 39–117)
ALT: 15 U/L (ref 0–35)
AST: 17 U/L (ref 0–37)
Bilirubin, Direct: 0.1 mg/dL (ref 0.0–0.3)
TOTAL PROTEIN: 7.4 g/dL (ref 6.0–8.3)
Total Bilirubin: 0.5 mg/dL (ref 0.2–1.2)

## 2017-06-26 ENCOUNTER — Ambulatory Visit: Payer: BC Managed Care – PPO | Admitting: Internal Medicine

## 2017-06-26 ENCOUNTER — Encounter: Payer: Self-pay | Admitting: Internal Medicine

## 2017-06-26 VITALS — BP 144/92 | HR 74 | Temp 98.3°F | Ht 68.0 in | Wt 163.0 lb

## 2017-06-26 DIAGNOSIS — Z0000 Encounter for general adult medical examination without abnormal findings: Secondary | ICD-10-CM

## 2017-06-26 DIAGNOSIS — Z114 Encounter for screening for human immunodeficiency virus [HIV]: Secondary | ICD-10-CM | POA: Diagnosis not present

## 2017-06-26 DIAGNOSIS — E119 Type 2 diabetes mellitus without complications: Secondary | ICD-10-CM | POA: Diagnosis not present

## 2017-06-26 NOTE — Progress Notes (Signed)
Subjective:    Patient ID: Penny Yates, female    DOB: 01/29/1956, 62 y.o.   MRN: [id number]  HPI Here for wellness and f/u;  Overall doing ok;  Pt denies Chest pain, worsening SOB, DOE, wheezing, orthopnea, PND, worsening LE edema, palpitations, dizziness or syncope.  Pt denies neurological change such as new headache, facial or extremity weakness.  Pt denies polydipsia, polyuria, or low sugar symptoms. Pt states overall good compliance with treatment and medications, good tolerability, and has been trying to follow appropriate diet.  Pt denies worsening depressive symptoms, suicidal ideation or panic. No fever, night sweats, wt loss, loss of appetite, or other constitutional symptoms.  Pt states good ability with ADL's, has low fall risk, home safety reviewed and adequate, no other significant changes in hearing or vision, and only occasionally active with exercise.  Declines pneumonia shot. BP at home < 140/90  Oldest son died 12-01-2024from influenza complications, he did not have the flu shot, pt grieving but Denies worsening depressive symptoms, suicidal ideation, or panic Past Medical History:  Diagnosis Date  . Anemia   . Chronic venous insufficiency    LE's  . Depression   . Diabetes mellitus without complication (HCC)   . Fracture of shaft of right radius 01/06/2013  . History of colonic polyps    multiple of succeeding years 96, 97, 98, and 99  . Hyperlipidemia   . Hypertension   . Hyperthyroidism    s/p rad Iodine-ellison  . Hypothyroidism 01/19/2015  . Leg length discrepancy    right leg longer  . Low back pain   . Lymphedema    left arm, both legs; primary lymphedema  . Sacroiliitis (HCC)    history of  . Sciatica    Past Surgical History:  Procedure Laterality Date  . EYE SURGERY     both eyes-muscle repair  . OPEN REDUCTION INTERNAL FIXATION (ORIF) DISTAL RADIAL FRACTURE Right 01/06/2013   Procedure: RIGHT OPEN REDUCTION INTERNAL FIXATION (ORIF) DISTAL ARTICULATING  RADIAL FRACTURE ;  Surgeon: Eulas Post, MD;  Location: [facility];  Service: Orthopedics;  Laterality: Right;  . 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 REVISION OF SCAR TISSUE RECTUS MUSCLE  11/08   right knee  . TUBAL LIGATION      reports that  has never smoked. she has never used smokeless tobacco. She reports that she does not drink alcohol or use drugs. family history includes Cancer in her mother; Colon cancer (age of onset: 58) in her mother; Diabetes in her mother and unknown relative; Heart disease in her maternal grandmother; Kidney disease in her unknown relative; Stroke in her unknown relative. Allergies  Allergen Reactions  . Ace Inhibitors Cough  . Lisinopril     Cough   Current Outpatient Medications on File Prior to Visit  Medication Sig Dispense Refill  . ALPHAGAN P 0.1 % SOLN INSTILL 1 DROP INTO BOTH EYES TWICE A DAY  11  . aspirin 81 MG tablet Take 81 mg by mouth daily.      Marland Kitchen atorvastatin (LIPITOR) 10 MG tablet TAKE 1 TABLET BY MOUTH EVERY DAY 90 tablet 0  . Biotin 5000 MCG CAPS Take by mouth.      . Calcium Carbonate-Vitamin D 300-100 MG-UNIT CAPS Take by mouth. Take one daily      . diclofenac sodium (VOLTAREN) 1 % GEL Apply 2 g topically 4 (four) times daily. To affected joint. 100 g 11  .  diltiazem (CARDIZEM CD) 360 MG 24 hr capsule TAKE 1 CAPSULE (360 MG TOTAL) BY MOUTH DAILY. 90 capsule 0  . Dorzolamide HCl-Timolol Mal PF 22.3-6.8 MG/ML SOLN Apply 1 drop to eye 2 (two) times daily. 1 drop in each eye twice a day.    . fish oil-omega-3 fatty acids 1000 MG capsule Take 1 capsule by mouth daily.     . furosemide (LASIX) 40 MG tablet Take 1 tablet (40 mg total) by mouth daily. 90 tablet 2  . guanFACINE (TENEX) 1 MG tablet Take 1 tablet (1 mg total) by mouth at bedtime. 90 tablet 3  . irbesartan (AVAPRO) 300 MG tablet TAKE 1 TABLET (300 MG TOTAL) BY MOUTH DAILY. 90 tablet 0  . KLOR-CON M10 10 MEQ tablet TAKE 1 TABLET BY MOUTH DAILY 90  tablet 0  . levothyroxine (SYNTHROID, LEVOTHROID) 75 MCG tablet TAKE 1 TABLET BY MOUTH DAILY BEFORE BREAKFAST 90 tablet 0  . meloxicam (MOBIC) 7.5 MG tablet TAKE 1 TABLET BY MOUTH EVERY DAY 30 tablet 0  . montelukast (SINGULAIR) 10 MG tablet Take 1 tablet (10 mg total) by mouth at bedtime. 90 tablet 3  . Multiple Vitamin (MULTIVITAMIN) capsule Take 1 capsule by mouth daily.      . traMADol (ULTRAM-ER) 200 MG 24 hr tablet TAKE 1 TABLET BY MOUTH DAILY AS NEEDED FOR PAIN 30 tablet 5   No current facility-administered medications on file prior to visit.    Review of Systems  .Constitutional: Negative for other unusual diaphoresis, sweats, appetite or weight changes HENT: Negative for other worsening hearing loss, ear pain, facial swelling, mouth sores or neck stiffness.   Eyes: Negative for other worsening pain, redness or other visual disturbance.  Respiratory: Negative for other stridor or swelling Cardiovascular: Negative for other palpitations or other chest pain  Gastrointestinal: Negative for worsening diarrhea or loose stools, blood in stool, distention or other pain Genitourinary: Negative for hematuria, flank pain or other change in urine volume.  Musculoskeletal: Negative for myalgias or other joint swelling.  Skin: Negative for other color change, or other wound or worsening drainage.  Neurological: Negative for other syncope or numbness. Hematological: Negative for other adenopathy or swelling Psychiatric/Behavioral: Negative for hallucinations, other worsening agitation, SI, self-injury, or new decreased concentration All other system neg per pt    Objective:   Physical Exam BP (!) 144/92   Pulse 74   Temp 98.3 F (36.8 C) (Oral)   Ht 5\' 8"  (1.727 m)   Wt 163 lb (73.9 kg)   SpO2 96%   BMI 24.78 kg/m  VS noted,  Constitutional: Pt is oriented to person, place, and time. Appears well-developed and well-nourished, in no significant distress and comfortable Head:  Normocephalic and atraumatic  Eyes: Conjunctivae and EOM are normal. Pupils are equal, round, and reactive to light Right Ear: External ear normal without discharge Left Ear: External ear normal without discharge Nose: Nose without discharge or deformity Mouth/Throat: Oropharynx is without other ulcerations and moist  Neck: Normal range of motion. Neck supple. No JVD present. No tracheal deviation present or significant neck LA or mass Cardiovascular: Normal rate, regular rhythm, normal heart sounds and intact distal pulses.   Pulmonary/Chest: WOB normal and breath sounds without rales or wheezing  Abdominal: Soft. Bowel sounds are normal. NT. No HSM  Musculoskeletal: Normal range of motion. Exhibits no edema Lymphadenopathy: Has no other cervical adenopathy.  Neurological: Pt is alert and oriented to person, place, and time. Pt has normal reflexes. No cranial nerve  deficit. Motor grossly intact, Gait intact Skin: Skin is warm and dry. No rash noted or new ulcerations Psychiatric:  Has normal mood and affect. Behavior is normal without agitation No other exam findings Lab Results  Component Value Date   WBC 6.6 06/24/2017   HGB 13.0 06/24/2017   HCT 37.8 06/24/2017   PLT 250.0 06/24/2017   GLUCOSE 105 (H) 06/24/2017   CHOL 164 06/24/2017   TRIG 45.0 06/24/2017   HDL 89.10 06/24/2017   LDLDIRECT 131.2 10/27/2008   LDLCALC 66 06/24/2017   ALT 15 06/24/2017   AST 17 06/24/2017   NA 143 06/24/2017   K 4.4 06/24/2017   CL 102 06/24/2017   CREATININE 0.98 06/24/2017   BUN 18 06/24/2017   CO2 32 06/24/2017   TSH 1.96 06/24/2017   HGBA1C 5.7 06/24/2017   MICROALBUR 4.7 (H) 06/24/2017      Assessment & Plan:

## 2017-06-26 NOTE — Patient Instructions (Signed)
Your shingles shot has been sent to the Pharmacy  Please continue all other medications as before, and refills have been done if requested.  Please have the pharmacy call with any other refills you may need.  Please continue your efforts at being more active, low cholesterol diet, and weight control.  You are otherwise up to date with prevention measures today.  Please keep your appointments with your specialists as you may have planned  Please return in 1 year for your yearly visit, or sooner if needed, with Lab testing done 3-5 days before

## 2017-06-29 NOTE — Assessment & Plan Note (Signed)

## 2017-06-29 NOTE — Assessment & Plan Note (Signed)
Lab Results  Component Value Date   HGBA1C 5.7 06/24/2017  stable overall by history and exam, recent data reviewed with pt, and pt to continue medical treatment as before,  to f/u any worsening symptoms or concerns

## 2017-07-12 ENCOUNTER — Other Ambulatory Visit: Payer: Self-pay | Admitting: Internal Medicine

## 2017-07-12 NOTE — Telephone Encounter (Signed)
Done erx 

## 2017-07-12 NOTE — Telephone Encounter (Signed)
last filled 06/14/2017

## 2017-08-22 ENCOUNTER — Encounter: Payer: Self-pay | Admitting: Gastroenterology

## 2017-08-29 ENCOUNTER — Other Ambulatory Visit: Payer: Self-pay | Admitting: Internal Medicine

## 2017-09-05 ENCOUNTER — Other Ambulatory Visit: Payer: Self-pay | Admitting: Internal Medicine

## 2017-09-10 ENCOUNTER — Other Ambulatory Visit: Payer: Self-pay | Admitting: Internal Medicine

## 2017-09-27 ENCOUNTER — Encounter: Payer: Self-pay | Admitting: *Deleted

## 2017-10-07 ENCOUNTER — Encounter: Payer: Self-pay | Admitting: Internal Medicine

## 2017-10-09 ENCOUNTER — Encounter (HOSPITAL_COMMUNITY): Payer: Self-pay | Admitting: Emergency Medicine

## 2017-10-09 ENCOUNTER — Emergency Department (HOSPITAL_COMMUNITY)
Admission: EM | Admit: 2017-10-09 | Discharge: 2017-10-09 | Disposition: A | Discharge status: Home / Self Care | Payer: BC Managed Care – PPO | Attending: Emergency Medicine | Admitting: Emergency Medicine

## 2017-10-09 ENCOUNTER — Emergency Department (HOSPITAL_COMMUNITY): Payer: BC Managed Care – PPO

## 2017-10-09 DIAGNOSIS — Z79899 Other long term (current) drug therapy: Secondary | ICD-10-CM | POA: Diagnosis not present

## 2017-10-09 DIAGNOSIS — R1031 Right lower quadrant pain: Secondary | ICD-10-CM | POA: Diagnosis not present

## 2017-10-09 DIAGNOSIS — K5900 Constipation, unspecified: Secondary | ICD-10-CM | POA: Diagnosis not present

## 2017-10-09 DIAGNOSIS — I10 Essential (primary) hypertension: Secondary | ICD-10-CM | POA: Insufficient documentation

## 2017-10-09 LAB — CBC
HCT: 34.7 % — ABNORMAL LOW (ref 36.0–46.0)
Hemoglobin: 12.3 g/dL (ref 12.0–15.0)
MCH: 29.1 pg (ref 26.0–34.0)
MCHC: 35.4 g/dL (ref 30.0–36.0)
MCV: 82.2 fL (ref 78.0–100.0)
PLATELETS: 221 10*3/uL (ref 150–400)
RBC: 4.22 MIL/uL (ref 3.87–5.11)
RDW: 15.2 % (ref 11.5–15.5)
WBC: 6.1 10*3/uL (ref 4.0–10.5)

## 2017-10-09 LAB — URINALYSIS, ROUTINE W REFLEX MICROSCOPIC
BILIRUBIN URINE: NEGATIVE
GLUCOSE, UA: NEGATIVE mg/dL
Hgb urine dipstick: NEGATIVE
Ketones, ur: NEGATIVE mg/dL
LEUKOCYTES UA: NEGATIVE
Nitrite: NEGATIVE
PROTEIN: NEGATIVE mg/dL
Specific Gravity, Urine: 1.02 (ref 1.005–1.030)
pH: 7 (ref 5.0–8.0)

## 2017-10-09 LAB — COMPREHENSIVE METABOLIC PANEL
ALK PHOS: 93 U/L (ref 38–126)
ALT: 16 U/L (ref 14–54)
ANION GAP: 6 (ref 5–15)
AST: 17 U/L (ref 15–41)
Albumin: 3.6 g/dL (ref 3.5–5.0)
BILIRUBIN TOTAL: 0.6 mg/dL (ref 0.3–1.2)
BUN: 22 mg/dL — ABNORMAL HIGH (ref 6–20)
CALCIUM: 10.1 mg/dL (ref 8.9–10.3)
CO2: 33 mmol/L — ABNORMAL HIGH (ref 22–32)
Chloride: 103 mmol/L (ref 101–111)
Creatinine, Ser: 1.06 mg/dL — ABNORMAL HIGH (ref 0.44–1.00)
GFR, EST NON AFRICAN AMERICAN: 55 mL/min — AB (ref >60)
Glucose, Bld: 89 mg/dL (ref 65–99)
POTASSIUM: 4 mmol/L (ref 3.5–5.1)
Sodium: 142 mmol/L (ref 135–145)
TOTAL PROTEIN: 7 g/dL (ref 6.5–8.1)

## 2017-10-09 LAB — LIPASE, BLOOD: Lipase: 19 U/L (ref 11–51)

## 2017-10-09 MED ORDER — POLYETHYLENE GLYCOL 3350 17 G PO PACK
17.0000 g | PACK | Freq: Every day | ORAL | 0 refills | Status: DC
Start: 2017-10-09 — End: 2020-03-01

## 2017-10-09 MED ORDER — DOCUSATE SODIUM 100 MG PO CAPS: 100.0000 mg | ORAL_CAPSULE | Freq: Two times a day (BID) | ORAL | 0 refills | Status: DC

## 2017-10-09 MED ORDER — IOPAMIDOL (ISOVUE-300) INJECTION 61%
100.0000 mL | Freq: Once | INTRAVENOUS | Status: AC | PRN
  Administered 2017-10-09: 100 mL via INTRAVENOUS

## 2017-10-09 MED ORDER — IOPAMIDOL (ISOVUE-300) INJECTION 61%
INTRAVENOUS | Status: AC
  Filled 2017-10-09: qty 100

## 2017-10-09 NOTE — ED Triage Notes (Signed)
Patient c/o RLQ pain x5 days. Denies N/V/D. Tender to RLQ.

## 2017-10-09 NOTE — Discharge Instructions (Addendum)
Your CT scan shows significant constipation.  There is some possible small inflammation, which you need to talk to your gastroenterologist about.  If you develop a fever, vomiting, or worse abdominal pain you need to return to the ER for evaluation.  Otherwise start the anticonstipation medicines as prescribed.

## 2017-10-09 NOTE — ED Provider Notes (Signed)
[facility]-EMERGENCY DEPT Provider Note   CSN: [id number] Arrival date & time: 10/09/17  1049     History   Chief Complaint Chief Complaint  Patient presents with  . Abdominal Pain    HPI Penny Yates is a 62 y.o. female.  HPI  62 year old female presents with abdominal pain.  She states she has had constant right lower quadrant abdominal pain for 5 days.  However today it has been worse.  She felt like she had a subjective fever and broke out into a sweat while at work.  The pain previously was about a 5 out of 10 and now is a 7 out of 10.  Is mostly sharp.  No urinary symptoms, nausea, vomiting, or diarrhea.  She has not had a bowel movement in a couple days but has not been eating during this pain and thinks that is why.  No prior abdominal surgeries.  Past Medical History:  Diagnosis Date  . Anemia   . Chronic venous insufficiency    LE's  . Depression   . Diabetes mellitus without complication (HCC)   . Fracture of shaft of right radius 01/06/2013  . History of colonic polyps    multiple of succeeding years 96, 97, 98, and 99  . Hyperlipidemia   . Hypertension   . Hyperthyroidism    s/p rad Iodine-ellison  . Hypothyroidism 01/19/2015  . Leg length discrepancy    right leg longer  . Low back pain   . Lymphedema    left arm, both legs; primary lymphedema  . Sacroiliitis (HCC)    history of  . Sciatica     Patient Active Problem List   Diagnosis Date Noted  . Hyperkalemia 06/26/2016  . Acquired lymphedema of leg 06/26/2016  . Leg length discrepancy 07/13/2015  . 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 Headache 06/28/2015  . Hypothyroidism 01/19/2015  . Right flank pain 01/14/2015  . Fracture of shaft of right radius 01/06/2013  . Palpitations 02/12/2012  . Diabetes (HCC) 01/08/2011  . Preventative health care 01/05/2011  . Hypopotassemia 10/18/2010  . Edema 07/26/2010  . SYNCOPE, HX OF 07/03/2010  . OSTEOPENIA  11/01/2009  . Bilateral hearing loss 06/17/2009  . DEPRESSION 01/07/2009  . FATIGUE 01/07/2009  . OBSTRUCTIVE SLEEP APNEA 11/17/2008  . HYPERSOMNIA 10/27/2008  . DIVERTICULOSIS, COLON 06/04/2008  . KNEE PAIN, RIGHT 01/27/2008  . BACK PAIN 01/27/2008  . GOITER, MULTINODULAR 10/08/2007  . HYPERTHYROIDISM 10/08/2007  . MENOPAUSAL SYNDROME 10/08/2007  . Hyperlipidemia 09/19/2007  . ANEMIA-IRON DEFICIENCY 09/19/2007  . Essential hypertension 09/19/2007  . LOW BACK PAIN 09/19/2007  . WEIGHT LOSS 09/19/2007  . COLONIC POLYPS, HX OF 09/19/2007    Past Surgical History:  Procedure Laterality Date  . EYE SURGERY     both eyes-muscle repair  . OPEN REDUCTION INTERNAL FIXATION (ORIF) DISTAL RADIAL FRACTURE Right 01/06/2013   Procedure: RIGHT OPEN REDUCTION INTERNAL FIXATION (ORIF) DISTAL ARTICULATING RADIAL FRACTURE ;  Surgeon: Eulas Post, MD;  Location: [facility];  Service: Orthopedics;  Laterality: Right;  . 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 REVISION OF SCAR TISSUE RECTUS MUSCLE  11/08   right knee  . TUBAL LIGATION       OB History   None      Home Medications    Prior to Admission medications   Medication Sig Start Date End Date Taking? Authorizing Provider  ALPHAGAN P 0.1 % SOLN INSTILL  1 DROP INTO BOTH EYES TWICE A DAY 05/31/16  Yes [provider]  amoxicillin (AMOXIL) 500 MG capsule Take 1,000 mg by mouth See admin instructions. Take 2 capsules by mouth 1 hr prior to dental procedure, then 2 capsules 4 hrs after 07/30/17  Yes [provider]  aspirin 81 MG tablet Take 81 mg by mouth daily.     Yes [provider]  atorvastatin (LIPITOR) 10 MG tablet TAKE 1 TABLET BY MOUTH EVERY DAY 08/30/17  Yes Corwin LevinsJohn, James W, MD  Biotin 5000 MCG CAPS Take 5,000 mcg by mouth daily.    Yes [provider]  CALCIUM CARBONATE-VITAMIN D PO Take 1 tablet by mouth daily. Take one daily     Yes [provider]  diltiazem  (CARDIZEM CD) 360 MG 24 hr capsule TAKE 1 CAPSULE (360 MG TOTAL) BY MOUTH DAILY. 09/10/17  Yes Corwin LevinsJohn, James W, MD  Dorzolamide HCl-Timolol Mal PF 22.3-6.8 MG/ML SOLN Apply 1 drop to eye 2 (two) times daily. 1 drop in each eye twice a day.   Yes [provider]  fish oil-omega-3 fatty acids 1000 MG capsule Take 1 capsule by mouth daily.    Yes [provider]  furosemide (LASIX) 40 MG tablet Take 1 tablet (40 mg total) by mouth daily. Patient taking differently: Take 40 mg by mouth daily as needed for fluid.  06/18/17  Yes Corwin LevinsJohn, James W, MD  guanFACINE (TENEX) 1 MG tablet Take 1 tablet (1 mg total) by mouth at bedtime. 08/29/16  Yes Corwin LevinsJohn, James W, MD  irbesartan (AVAPRO) 300 MG tablet TAKE 1 TABLET (300 MG TOTAL) BY MOUTH DAILY. 08/30/17  Yes Corwin LevinsJohn, James W, MD  KLOR-CON M10 10 MEQ tablet TAKE 1 TABLET BY MOUTH DAILY 09/05/17  Yes Corwin LevinsJohn, James W, MD  latanoprost (XALATAN) 0.005 % ophthalmic solution Place 1 drop into both eyes at bedtime. 09/12/17  Yes [provider]  levothyroxine (SYNTHROID, LEVOTHROID) 75 MCG tablet TAKE 1 TABLET BY MOUTH DAILY BEFORE BREAKFAST 09/05/17  Yes Corwin LevinsJohn, James W, MD  Multiple Vitamin (MULTIVITAMIN) capsule Take 1 capsule by mouth daily.     Yes [provider]  Polyethyl Glycol-Propyl Glycol (SYSTANE) 0.4-0.3 % SOLN Place 1 drop into both eyes daily as needed (dry eyes).    Yes [provider]  traMADol (ULTRAM-ER) 200 MG 24 hr tablet TAKE 1 TABLET BY MOUTH DAILY AS NEEDED FOR PAIN 07/12/17  Yes Corwin LevinsJohn, James W, MD  docusate sodium (COLACE) 100 MG capsule Take 1 capsule (100 mg total) by mouth every 12 (twelve) hours. 10/09/17   Pricilla LovelessGoldston, Brendon Christoffel, MD  montelukast (SINGULAIR) 10 MG tablet Take 1 tablet (10 mg total) by mouth at bedtime. Patient not taking: Reported on 10/09/2017 07/13/15   Judi SaaSmith, Zachary M, DO  polyethylene glycol [facility](MIRALAX / Ethelene HalGLYCOLAX) packet Take 17 g by mouth daily. 10/09/17   Pricilla LovelessGoldston, Fleurette Woolbright, MD    Family History Family  History  Problem Relation Age of Onset  . Colon cancer Mother 2354  . Diabetes Mother   . Cancer Mother        Colon Cancer  . Diabetes Unknown        mother and several others in family  . Stroke Unknown   . Kidney disease Unknown   . Heart disease Maternal Grandmother   . Thyroid disease Neg Hx   . Esophageal cancer Neg Hx   . Rectal cancer Neg Hx   . Stomach cancer Neg Hx     Social History  Social History   Tobacco Use  . Smoking status: Never Smoker  . Smokeless tobacco: Never Used  Substance Use Topics  . Alcohol use: No  . Drug use: No     Allergies   Ace inhibitors and Lisinopril   Review of Systems Review of Systems  Constitutional: Positive for diaphoresis and fever (subjective).  Respiratory: Negative for shortness of breath.   Cardiovascular: Negative for chest pain.  Gastrointestinal: Positive for abdominal pain and constipation. Negative for diarrhea, nausea and vomiting.  Genitourinary: Negative for dysuria and hematuria.  Musculoskeletal: Negative for back pain (chronic back pain but no knew back pain).  All other systems reviewed and are negative.    Physical Exam Updated Vital Signs BP (!) 155/93 (BP Location: Left Arm)   Pulse (!) 49   Temp 98.3 F (36.8 C) (Oral)   Resp 16   SpO2 100%   Physical Exam  Constitutional: She is oriented to person, place, and time. She appears well-developed and well-nourished.  Non-toxic appearance. She does not appear ill. No distress.  HENT:  Head: Normocephalic and atraumatic.  Right Ear: External ear normal.  Left Ear: External ear normal.  Nose: Nose normal.  Eyes: Right eye exhibits no discharge. Left eye exhibits no discharge.  Cardiovascular: Normal rate, regular rhythm and normal heart sounds.  Pulmonary/Chest: Effort normal and breath sounds normal.  Abdominal: Soft. There is tenderness in the right lower quadrant. There is CVA tenderness (mild, right sided).  Neurological: She is alert and  oriented to person, place, and time.  Skin: Skin is warm and dry.  Nursing note and vitals reviewed.    ED Treatments / Results  Labs (all labs ordered are listed, but only abnormal results are displayed) Labs Reviewed  COMPREHENSIVE METABOLIC PANEL - Abnormal; Notable for the following components:      Result Value   CO2 33 (*)    BUN 22 (*)    Creatinine, Ser 1.06 (*)    GFR calc non Af Amer 55 (*)    All other components within normal limits  CBC - Abnormal; Notable for the following components:   HCT 34.7 (*)    All other components within normal limits  LIPASE, BLOOD  URINALYSIS, ROUTINE W REFLEX MICROSCOPIC    EKG None  Radiology Ct Abdomen Pelvis W Contrast  Result Date: 10/09/2017 CLINICAL DATA:  RIGHT lower quadrant pain for 5 days, decreased appetite, feels feverish. EXAM: CT ABDOMEN AND PELVIS WITH CONTRAST TECHNIQUE: Multidetector CT imaging of the abdomen and pelvis was performed using the standard protocol following bolus administration of intravenous contrast. CONTRAST:  ISOVUE-300 IOPAMIDOL (ISOVUE-300) INJECTION 61% COMPARISON:  CT abdomen dated 01/14/2015. FINDINGS: Lower chest: No acute abnormality. Hepatobiliary: Stable RIGHT liver lobe cyst. Liver otherwise unremarkable. Gallbladder appears normal. No bile duct dilatation. Pancreas: Unremarkable. No pancreatic ductal dilatation or surrounding inflammatory changes. Spleen: Normal in size without focal abnormality. Adrenals/Urinary Tract: Adrenal glands appear normal. Kidneys are unremarkable without mass, stone or hydronephrosis. No perinephric fluid. No ureteral or bladder calculi identified. Bladder appears normal, partially decompressed. Stomach/Bowel: No large or small bowel dilatation. Stomach is unremarkable, partially decompressed. Fairly large amount of stool and gas is seen throughout the nondistended colon. Appendix is normal. There is thickening of the fascia overlying the lower RIGHT colon/cecum. On  coronal reconstructions, there is focal thickening of the walls of the RIGHT colon, outer wall, with mild pericolonic inflammation/fluid stranding. This could represent a focal diverticulitis or epiploic appendagitis. Vascular/Lymphatic: No significant vascular  findings are present. No enlarged abdominal or pelvic lymph nodes. Reproductive: Calcified fibroids within the uterus. Tubal ligation clips in place. No adnexal mass or free fluid seen. Other: No free fluid or abscess collection. No free intraperitoneal air. Musculoskeletal: No acute or suspicious osseous finding. IMPRESSION: 1. Inflammatory thickening of the fascia overlying the lower RIGHT colon/cecum, with associated pericolonic inflammation/fluid stranding, and questionable focal thickening within the underlying outer wall of the RIGHT colon. No abscess collection. Considerations include focal acute diverticulitis and epiploica appendagitis. The appendix appears to be uninvolved. Neoplastic process is considered unlikely. Recommend close clinical follow-up and follow-up CT if symptoms of RIGHT lower quadrant pain persist or worsen. Would consider follow-up CT in 2-3 months to ensure resolution and prove benignity. 2. Fairly large amount of stool and gas throughout the colon (constipation?). Electronically Signed   By: Bary Richard M.D.   On: 10/09/2017 14:03    Procedures Procedures (including critical care time)  Medications Ordered in ED Medications  iopamidol (ISOVUE-300) 61 % injection (has no administration in time range)  iopamidol (ISOVUE-300) 61 % injection 100 mL (100 mLs Intravenous Contrast Given 10/09/17 1335)     Initial Impression / Assessment and Plan / ED Course  I have reviewed the triage vital signs and the nursing notes.  Pertinent labs & imaging results that were available during my care of the patient were reviewed by me and considered in my medical decision making (see chart for details).     CT scan shows a large  amount of stool as well as a possible small inflammatory process in the right lower quadrant.  Does not appear to involve the appendix.  I discussed with GI, Lucrezia Europe, who recommends constipation treatment and she will arrange for her to follow-up next week.  Patient actually tells me she already has a GI follow-up for next week and is due for colonoscopy.  With no current fever or objective fever, normal WBC and overall well appearance where she is sitting up and talking without difficulty, I think infectious process is unlikely and will hold on antibiotics.  Discussed return precautions.  Final Clinical Impressions(s) / ED Diagnoses   Final diagnoses:  Abdominal pain, RLQ (right lower quadrant)  Constipation, unspecified constipation type    ED Discharge Orders        Ordered    polyethylene glycol (MIRALAX / GLYCOLAX) packet  Daily     10/09/17 1500    docusate sodium (COLACE) 100 MG capsule  Every 12 hours     10/09/17 1500       Pricilla Loveless, MD 10/09/17 1622

## 2017-10-09 NOTE — ED Notes (Signed)
Patient transported to CT 

## 2017-10-10 ENCOUNTER — Ambulatory Visit: Payer: BC Managed Care – PPO | Admitting: Internal Medicine

## 2017-10-10 ENCOUNTER — Encounter

## 2017-10-14 ENCOUNTER — Other Ambulatory Visit: Payer: Self-pay

## 2017-10-14 ENCOUNTER — Ambulatory Visit (AMBULATORY_SURGERY_CENTER): Payer: Self-pay | Admitting: *Deleted

## 2017-10-14 VITALS — Ht 68.5 in | Wt 156.0 lb

## 2017-10-14 DIAGNOSIS — Z800 Family history of malignant neoplasm of digestive organs: Secondary | ICD-10-CM

## 2017-10-14 DIAGNOSIS — R1031 Right lower quadrant pain: Secondary | ICD-10-CM

## 2017-10-14 NOTE — Progress Notes (Signed)
Patient came in today for pv for colonoscopy with Dr.Armbruster. She explained she was seen in the ED on 10/09/17 for RLQ abd. Pain and that the dr there made her appointment here to see Gunnar FusiPaula, NP. I did complete the PV but did not given prep instructions due to her needing to see Gunnar Fusiaula on Wednesday. Colonoscopy is still scheduled at this time for 10/28/17, pt aware. Pt denies having a colonoscopy here in 2014 with Dr.Kaplan as per her chart.

## 2017-10-15 ENCOUNTER — Encounter: Payer: Self-pay | Admitting: Gastroenterology

## 2017-10-16 ENCOUNTER — Encounter: Payer: Self-pay | Admitting: Nurse Practitioner

## 2017-10-16 ENCOUNTER — Ambulatory Visit (INDEPENDENT_AMBULATORY_CARE_PROVIDER_SITE_OTHER): Payer: BC Managed Care – PPO | Admitting: Nurse Practitioner

## 2017-10-16 VITALS — BP 130/84 | HR 66 | Ht 68.0 in | Wt 158.2 lb

## 2017-10-16 DIAGNOSIS — R1031 Right lower quadrant pain: Secondary | ICD-10-CM | POA: Diagnosis not present

## 2017-10-16 DIAGNOSIS — R933 Abnormal findings on diagnostic imaging of other parts of digestive tract: Secondary | ICD-10-CM | POA: Diagnosis not present

## 2017-10-16 DIAGNOSIS — Z800 Family history of malignant neoplasm of digestive organs: Secondary | ICD-10-CM | POA: Diagnosis not present

## 2017-10-16 MED ORDER — NA SULFATE-K SULFATE-MG SULF 17.5-3.13-1.6 GM/177ML PO SOLN: ORAL | 0 refills | Status: DC

## 2017-10-16 MED ORDER — AMOXICILLIN-POT CLAVULANATE 875-125 MG PO TABS: 1.0000 | ORAL_TABLET | Freq: Two times a day (BID) | ORAL | 0 refills | Status: DC

## 2017-10-16 NOTE — Patient Instructions (Signed)
If you are age 64 or older, your body mass index should be between 23-30. Your Body mass index is 24.05 kg/m. If this is out of the aforementioned range listed, please consider follow up with your Primary Care Provider.  If you are age 964 or younger, your body mass index should be between 19-25. Your Body mass index is 24.05 kg/m. If this is out of the aformentioned range listed, please consider follow up with your Primary Care Provider.   You have been scheduled for a colonoscopy. Please follow written instructions given to you at your visit today.  Please pick up your prep supplies at the pharmacy within the next 1-3 days. If you use inhalers (even only as needed), please bring them with you on the day of your procedure. Your physician has requested that you go to www.startemmi.com and enter the access code given to you at your visit today. This web site gives a general overview about your procedure. However, you should still follow specific instructions given to you by our office regarding your preparation for the procedure.  We have sent the following medications to your pharmacy for you to pick up at your convenience: Suprep Augmentin  Thank you for choosing me and [facility].   Willette ClusterPaula Guenther, NP

## 2017-10-16 NOTE — Progress Notes (Signed)
Chief Complaint: ED follow up for RLQ pain  Referring Provider:   Pricilla Loveless, MD     ASSESSMENT AND PLAN;    1. 62 yo female with recent RLQ pain / Abnormal CT scan. RLQ pericolonic inflammation / stranding raising concern for ? diverticulitis,  ? epiploic appendagitis, ? neoplasm (felt less likely). A large amount of stool was seen was seen throughout the colon.  -RLQ better but still moderately tender. Treat empirically for diverticulitis with Augmentin 875 mg BID x 10 days -Following this patient will need colonoscopy (5-6 weeks).  Patient will contact us in the interim if pain/tenderness do not resolve and/or she develops fevers or other GI symptoms.  Otherwise we will proceed with a colonoscopy sometime in August. The risks and benefits of colonoscopy with possible polypectomy were discussed and the patient agrees to proceed.  -continue Miralax and colace prescribed by ED. If excessive BMs then reduce Miralax to QOD  2. [facility] of colon cancer in mother, age 35.  Per patient her last colonoscopy was 2008 in [place].  We have a colonoscopy report from 2014 by Dr. Arlyce Dice .  The report has the patient's correct name and DOB but patient is adamant that this is not her report.   At any rate she is scheduled for recall colonoscopy 10/28/17 with Dr. Adela Lank.  Of course this will be postponed to sometime in August given #1    HPI:    62 year old female, previously known to Dr. Arlyce Dice.  Patient has a family history of colon cancer in her mother at age 17.  Her last colonoscopy was in 2008 in [place] with findings of diverticulosis and apparently hyperplastic polyps.  Patient saw Dr. Arlyce Dice in 2010 to discuss need for colonoscopy but it was recommended she follow-up in 2013.  There is a colonoscopy report in her chart from 10/16/2012 but patient states that this is absolutely not her colonoscopy report.  She is 100% certain that her last colonoscopy was in 2008.  At any rate her chart was  recently reviewed by Dr. Adela Lank and patient is scheduled for colonoscopy in early July.  Last week patient presented to the ED for evaluation of RLQ pain.  The pain started started 5 days earlier but became progressively worse. It was unrelated to eating. She felt feverish at times. No N/V, felt like bowels were moving fine overall. No blood in stool.     ED evaluation:  WBC 6.1, hemoglobin 12.3, liver chemistries normal.  Lipase 19 CT scan showed inflammatory thickening of the fascia overlying the right lower colon/cecum with associated pericolonic inflammation / stranding and questionable focal thickening within the underlying wall of the right colon.  There was a fairly large amount of stool and gas throughout the colon . Appendix didn't appear to be involved. Considerations included focal acute diverticulitis, epiploic appendagitis, neoplastic process not felt likely.  We spoke to the EDP while patient was in the ED.  She was given this appointment to see me today.  We advised MiraLAX for possible constipation.  After reviewing the CT scan we decided to treat empirically for diverticulitis but patient had left the ED.  I personally called patient and left a voicemail but was unable to reach her and did not get a return call.    Patient is here with her husband.  The RLQ pain is much better though she still admits to tenderness in the area.  Her bowels are moving well with MiraLAX and Colace.  Past Medical History:  Diagnosis Date  . Anemia   . Chronic venous insufficiency    LE's  . Depression    pt denies  . Diabetes mellitus without complication (HCC)   . Fracture of shaft of right radius 01/06/2013  . History of colonic polyps    multiple of succeeding years 96, 97, 98, and 99  . Hyperlipidemia   . Hypertension   . Hyperthyroidism    s/p rad Iodine-ellison  . Hypothyroidism 01/19/2015  . Leg length discrepancy    right leg longer  . Low back pain   . Lymphedema    left arm,  both legs; primary lymphedema  . Sacroiliitis (HCC)    history of  . Sciatica      Past Surgical History:  Procedure Laterality Date  . [place]  [place]  . EYE SURGERY     both eyes-muscle repair  . OPEN REDUCTION INTERNAL FIXATION (ORIF) DISTAL RADIAL FRACTURE Right 01/06/2013   Procedure: RIGHT OPEN REDUCTION INTERNAL FIXATION (ORIF) DISTAL ARTICULATING RADIAL FRACTURE ;  Surgeon: Eulas PostJoshua P Landau, MD;  Location: [facility];  Service: Orthopedics;  Laterality: Right;  . 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 REVISION OF SCAR TISSUE RECTUS MUSCLE  11/08   right knee  . TUBAL LIGATION     Family History  Problem Relation Age of Onset  . Colon cancer Mother 1454  . Diabetes Mother   . Cancer Mother        Colon Cancer  . Diabetes Unknown        mother and several others in family  . Stroke Unknown   . Kidney disease Unknown   . Heart disease Maternal Grandmother   . Thyroid disease Neg Hx   . Esophageal cancer Neg Hx   . Rectal cancer Neg Hx   . Stomach cancer Neg Hx    Social History   Tobacco Use  . Smoking status: Never Smoker  . Smokeless tobacco: Never Used  Substance Use Topics  . Alcohol use: No  . Drug use: No   Current Outpatient Medications  Medication Sig Dispense Refill  . ALPHAGAN P 0.1 % SOLN INSTILL 1 DROP INTO BOTH EYES TWICE A DAY  11  . amoxicillin (AMOXIL) 500 MG capsule Take 1,000 mg by mouth See admin instructions. Take 2 capsules by mouth 1 hr prior to dental procedure, then 2 capsules 4 hrs after  1  . aspirin 81 MG tablet Take 81 mg by mouth daily.      Marland Kitchen. atorvastatin (LIPITOR) 10 MG tablet TAKE 1 TABLET BY MOUTH EVERY DAY 90 tablet 2  . Biotin 5000 MCG CAPS Take 5,000 mcg by mouth daily.     Marland Kitchen. CALCIUM CARBONATE-VITAMIN D PO Take 1 tablet by mouth daily. Take one daily      . diltiazem (CARDIZEM CD) 360 MG 24 hr capsule TAKE 1 CAPSULE (360 MG TOTAL) BY MOUTH DAILY. 90 capsule 3  . docusate sodium (COLACE) 100 MG  capsule Take 1 capsule (100 mg total) by mouth every 12 (twelve) hours. 60 capsule 0  . Dorzolamide HCl-Timolol Mal PF 22.3-6.8 MG/ML SOLN Apply 1 drop to eye 2 (two) times daily. 1 drop in each eye twice a day.    . fish oil-omega-3 fatty acids 1000 MG capsule Take 1 capsule by mouth daily.     . furosemide (LASIX) 40 MG tablet Take 1 tablet (40 mg total) by mouth daily. 90 tablet 2  .  guanFACINE (TENEX) 1 MG tablet Take 1 tablet (1 mg total) by mouth at bedtime. 90 tablet 3  . irbesartan (AVAPRO) 300 MG tablet TAKE 1 TABLET (300 MG TOTAL) BY MOUTH DAILY. 90 tablet 2  . KLOR-CON M10 10 MEQ tablet TAKE 1 TABLET BY MOUTH DAILY 90 tablet 0  . latanoprost (XALATAN) 0.005 % ophthalmic solution Place 1 drop into both eyes at bedtime.  4  . levothyroxine (SYNTHROID, LEVOTHROID) 75 MCG tablet TAKE 1 TABLET BY MOUTH DAILY BEFORE BREAKFAST 90 tablet 0  . Multiple Vitamin (MULTIVITAMIN) capsule Take 1 capsule by mouth daily.      Bertram Gala Glycol-Propyl Glycol (SYSTANE) 0.4-0.3 % SOLN Place 1 drop into both eyes daily as needed (dry eyes).     . polyethylene glycol (MIRALAX / GLYCOLAX) packet Take 17 g by mouth daily. 14 each 0  . Probiotic Product (PHILLIPS COLON HEALTH PO) Take by mouth.    . traMADol (ULTRAM-ER) 200 MG 24 hr tablet TAKE 1 TABLET BY MOUTH DAILY AS NEEDED FOR PAIN 30 tablet 5   No current facility-administered medications for this visit.    Allergies  Allergen Reactions  . Ace Inhibitors Cough  . Lisinopril Other (See Comments)    Cough     Review of Systems: All systems reviewed and negative except where noted in HPI.   Serum creatinine: 1.06 mg/dL (H) 40/98/11 9147 Estimated creatinine clearance: 54.8 mL/min (A)   Physical Exam:    Wt Readings from Last 3 Encounters:  10/16/17 158 lb 3.2 oz (71.8 kg)  10/14/17 156 lb (70.8 kg)  06/26/17 163 lb (73.9 kg)    BP 130/84   Pulse 66   Ht 5\' 8"  (1.727 m)   Wt 158 lb 3.2 oz (71.8 kg)   SpO2 96%   BMI 24.05 kg/m    Constitutional:  Pleasant female in no acute distress. Psychiatric: Normal mood and affect. Behavior is normal. EENT: Pupils normal.  Conjunctivae are normal. No scleral icterus. Neck supple.  Cardiovascular: Normal rate, regular rhythm. No edema Pulmonary/chest: Effort normal and breath sounds normal. No wheezing, rales or rhonchi. Abdominal: Soft, nondistended, nontender. Bowel sounds active throughout. There are no masses palpable. No hepatomegaly. Neurological: Alert and oriented to person place and time. Skin: Skin is warm and dry. No rashes noted.  Willette Cluster, NP  10/16/2017, 3:24 PM  Cc: Corwin Levins, MD

## 2017-10-17 NOTE — Progress Notes (Signed)
Agree with assessment and plan as outlined.  

## 2017-10-28 ENCOUNTER — Encounter: Payer: Self-pay | Admitting: Gastroenterology

## 2017-12-03 ENCOUNTER — Encounter: Payer: Self-pay | Admitting: Gastroenterology

## 2017-12-17 ENCOUNTER — Ambulatory Visit (AMBULATORY_SURGERY_CENTER): Payer: BC Managed Care – PPO | Admitting: Gastroenterology

## 2017-12-17 ENCOUNTER — Encounter: Payer: Self-pay | Admitting: Gastroenterology

## 2017-12-17 VITALS — BP 132/78 | HR 57 | Temp 97.5°F | Resp 11 | Ht 68.0 in | Wt 158.0 lb

## 2017-12-17 DIAGNOSIS — R935 Abnormal findings on diagnostic imaging of other abdominal regions, including retroperitoneum: Secondary | ICD-10-CM | POA: Diagnosis not present

## 2017-12-17 DIAGNOSIS — Z800 Family history of malignant neoplasm of digestive organs: Secondary | ICD-10-CM

## 2017-12-17 MED ORDER — SODIUM CHLORIDE 0.9 % IV SOLN: 500.0000 mL | Freq: Once | INTRAVENOUS | Status: DC

## 2017-12-17 NOTE — Patient Instructions (Signed)
**   Handout given on diverticulosis **   YOU HAD AN ENDOSCOPIC PROCEDURE TODAY AT THE [facility]:   Refer to the procedure report that was given to you for any specific questions about what was found during the examination.  If the procedure report does not answer your questions, please call your gastroenterologist to clarify.  If you requested that your care partner not be given the details of your procedure findings, then the procedure report has been included in a sealed envelope for you to review at your convenience later.  YOU SHOULD EXPECT: Some feelings of bloating in the abdomen. Passage of more gas than usual.  Walking can help get rid of the air that was put into your GI tract during the procedure and reduce the bloating. If you had a lower endoscopy (such as a colonoscopy or flexible sigmoidoscopy) you may notice spotting of blood in your stool or on the toilet paper. If you underwent a bowel prep for your procedure, you may not have a normal bowel movement for a few days.  Please Note:  You might notice some irritation and congestion in your nose or some drainage.  This is from the oxygen used during your procedure.  There is no need for concern and it should clear up in a day or so.  SYMPTOMS TO REPORT IMMEDIATELY:   Following lower endoscopy (colonoscopy or flexible sigmoidoscopy):  Excessive amounts of blood in the stool  Significant tenderness or worsening of abdominal pains  Swelling of the abdomen that is new, acute  Fever of 100F or higher  For urgent or emergent issues, a gastroenterologist can be reached at any hour by calling [phone].   DIET:  We do recommend a small meal at first, but then you may proceed to your regular diet.  Drink plenty of fluids but you should avoid alcoholic beverages for 24 hours.  ACTIVITY:  You should plan to take it easy for the rest of today and you should NOT DRIVE or use heavy machinery until tomorrow (because of the  sedation medicines used during the test).    FOLLOW UP: Our staff will call the number listed on your records the next business day following your procedure to check on you and address any questions or concerns that you may have regarding the information given to you following your procedure. If we do not reach you, we will leave a message.  However, if you are feeling well and you are not experiencing any problems, there is no need to return our call.  We will assume that you have returned to your regular daily activities without incident.  If any biopsies were taken you will be contacted by phone or by letter within the next 1-3 weeks.  Please call us at [phone] if you have not heard about the biopsies in 3 weeks.    SIGNATURES/CONFIDENTIALITY: You and/or your care partner have signed paperwork which will be entered into your electronic medical record.  These signatures attest to the fact that that the information above on your After Visit Summary has been reviewed and is understood.  Full responsibility of the confidentiality of this discharge information lies with you and/or your care-partner. 

## 2017-12-17 NOTE — Progress Notes (Signed)
Report given to PACU, vss 

## 2017-12-17 NOTE — Op Note (Addendum)
[facility] Patient Name: Penny Yates Procedure Date: 12/17/2017 2:36 PM MRN: [id number] Endoscopist: Viviann Spare P. Adela Lank , MD Age: 62 Referring MD:  Date of Birth: 03/26/1956 Gender: Female Account #: [id number] Procedure:                Colonoscopy Indications:              Abnormal CT of the GI tract (history of right sided                            diverticulitis versus epiploic appendagitis),                            family history of colon cancer in mother diagnosed                            age 46s Medicines:                Monitored Anesthesia Care Procedure:                Pre-Anesthesia Assessment:                           - Prior to the procedure, a History and Physical                            was performed, and patient medications and                            allergies were reviewed. The patient's tolerance of                            previous anesthesia was also reviewed. The risks                            and benefits of the procedure and the sedation                            options and risks were discussed with the patient.                            All questions were answered, and informed consent                            was obtained. Prior Anticoagulants: The patient has                            taken no previous anticoagulant or antiplatelet                            agents. ASA Grade Assessment: II - A patient with                            mild systemic disease. After reviewing the risks  and benefits, the patient was deemed in                            satisfactory condition to undergo the procedure.                           After obtaining informed consent, the colonoscope                            was passed under direct vision. Throughout the                            procedure, the patient's blood pressure, pulse, and                            oxygen saturations were monitored continuously.  The                            Colonoscope was introduced through the anus and                            advanced to the the terminal ileum, with                            identification of the appendiceal orifice and IC                            valve. The colonoscopy was performed without                            difficulty. The patient tolerated the procedure                            well. The quality of the bowel preparation was                            adequate. The terminal ileum, ileocecal valve,                            appendiceal orifice, and rectum were photographed. Scope In: 2:40:15 PM Scope Out: 3:05:01 PM Scope Withdrawal Time: 0 hours 20 minutes 10 seconds  Total Procedure Duration: 0 hours 24 minutes 46 seconds  Findings:                 The perianal and digital rectal examinations were                            normal.                           The terminal ileum appeared normal.                           Multiple medium-mouthed diverticula were found in  the sigmoid colon and ascending colon.                           The colon was tortuous.                           The exam was otherwise normal throughout the                            examined colon. Retroflexed views of the rectum not                            obtained due to small rectal vault. Complications:            No immediate complications. Estimated blood loss:                            None. Estimated Blood Loss:     Estimated blood loss: none. Impression:               - The examined portion of the ileum was normal.                           - Diverticulosis in the sigmoid colon and in the                            ascending colon.                           - Tortuous colon.                           - Normal colon otherwise, no polyps                           Overall, suspect the patient may have had                            diverticulitis versus epiploic  appendagitis to                            cause prior CT findings Recommendation:           - Patient has a contact number available for                            emergencies. The signs and symptoms of potential                            delayed complications were discussed with the                            patient. Return to normal activities tomorrow.                            Written discharge instructions were provided to the  patient.                           - Resume previous diet.                           - Continue present medications.                           - Repeat colonoscopy in 5 years for screening                            purposes given strong family history of colon cancer Javohn Basey P. Kyran Whittier, MD 12/17/2017 3:09:19 PM This report has been signed electronically.

## 2017-12-17 NOTE — Progress Notes (Signed)
Pt's states no medical or surgical changes since previsit or office visit. 

## 2017-12-18 ENCOUNTER — Telehealth: Payer: Self-pay | Admitting: *Deleted

## 2017-12-18 NOTE — Telephone Encounter (Signed)
  Follow up Call-  Call back number 12/17/2017  Post procedure Call Back phone  # [phone]  Permission to leave phone message Yes  Some recent data might be hidden     Patient questions:  Do you have a fever, pain , or abdominal swelling? No. Pain Score  0 *  Have you tolerated food without any problems? Yes.    Have you been able to return to your normal activities? Yes.    Do you have any questions about your discharge instructions: Diet   No. Medications  No. Follow up visit  No.  Do you have questions or concerns about your Care? No.  Actions: * If pain score is 4 or above: No action needed, pain <4.

## 2017-12-23 ENCOUNTER — Other Ambulatory Visit: Payer: Self-pay | Admitting: Internal Medicine

## 2018-01-04 ENCOUNTER — Other Ambulatory Visit: Payer: Self-pay | Admitting: Internal Medicine

## 2018-01-11 ENCOUNTER — Other Ambulatory Visit: Payer: Self-pay | Admitting: Internal Medicine

## 2018-01-13 NOTE — Telephone Encounter (Signed)
Done erx 

## 2018-01-13 NOTE — Telephone Encounter (Signed)
Routing to dr john, please advise, thanks 

## 2018-03-11 ENCOUNTER — Telehealth: Payer: Self-pay | Admitting: Internal Medicine

## 2018-03-11 NOTE — Telephone Encounter (Signed)
Form has been completed & placed in providers box to review the renewal and sign if approve.

## 2018-03-11 NOTE — Telephone Encounter (Signed)
Pt's Spouse, Francisco CapuchinFerenzo, dropped off [employer] application for renewal of disability parking placard.  Please call Francisco CapuchinFerenzo once form is completed and he will come pick it up [phone].  Form put in Brittany's box.

## 2018-03-13 NOTE — Telephone Encounter (Signed)
Form has been signed, Copy sent to scan.   Spouse informed it is ready to be picked up.

## 2018-06-03 ENCOUNTER — Other Ambulatory Visit: Payer: Self-pay | Admitting: Internal Medicine

## 2018-06-23 ENCOUNTER — Other Ambulatory Visit: Payer: Self-pay | Admitting: Internal Medicine

## 2018-07-07 ENCOUNTER — Encounter: Payer: Self-pay | Admitting: Internal Medicine

## 2018-07-07 ENCOUNTER — Other Ambulatory Visit: Payer: Self-pay | Admitting: Internal Medicine

## 2018-07-08 ENCOUNTER — Encounter: Payer: Self-pay | Admitting: Internal Medicine

## 2018-07-08 NOTE — Telephone Encounter (Signed)
Done erx  Please let pt know it is time for yearly visit

## 2018-07-09 ENCOUNTER — Encounter: Payer: Self-pay | Admitting: Internal Medicine

## 2018-07-09 NOTE — Telephone Encounter (Signed)
Penny Yates pt 

## 2018-08-08 ENCOUNTER — Other Ambulatory Visit: Payer: Self-pay | Admitting: Internal Medicine

## 2018-08-08 NOTE — Telephone Encounter (Signed)
Done erx 

## 2018-08-19 ENCOUNTER — Encounter: Payer: Self-pay | Admitting: Internal Medicine

## 2018-08-30 ENCOUNTER — Other Ambulatory Visit: Payer: Self-pay | Admitting: Internal Medicine

## 2018-09-02 ENCOUNTER — Other Ambulatory Visit: Payer: Self-pay | Admitting: Internal Medicine

## 2018-09-07 ENCOUNTER — Other Ambulatory Visit: Payer: Self-pay | Admitting: Internal Medicine

## 2018-09-08 NOTE — Telephone Encounter (Signed)
[place] Controlled Database Checked Last filled: 08/08/18 # 30 LOV w/you: 07/06/17 Next appt w/you: 10/14/18

## 2018-09-08 NOTE — Telephone Encounter (Signed)
Done erx 

## 2018-09-10 ENCOUNTER — Telehealth: Payer: Self-pay | Admitting: Internal Medicine

## 2018-09-11 MED ORDER — TRAMADOL HCL ER 200 MG PO TB24: 200.0000 mg | ORAL_TABLET | Freq: Every day | ORAL | 0 refills | Status: DC

## 2018-09-11 NOTE — Telephone Encounter (Signed)
Done erx  But please let pt know this is last prescription due to office refill policy;  Needs ROV/yearly visit for further refills

## 2018-09-11 NOTE — Telephone Encounter (Signed)
Key: [id number]

## 2018-09-11 NOTE — Telephone Encounter (Signed)
Copied from CRM [id number]. Topic: General - Other >> Sep 11, 2018  2:14 PM Percival Spanish wrote:  Pharmacy call and said pt was not out of refills her insurance is requiring a PA and he is faxing the form over    traMADol (ULTRAM-ER) 200 MG 24 hr tablet

## 2018-09-11 NOTE — Telephone Encounter (Signed)
Approved.  

## 2018-10-02 ENCOUNTER — Other Ambulatory Visit: Payer: Self-pay | Admitting: Internal Medicine

## 2018-10-14 ENCOUNTER — Telehealth: Payer: Self-pay

## 2018-10-14 ENCOUNTER — Ambulatory Visit (INDEPENDENT_AMBULATORY_CARE_PROVIDER_SITE_OTHER): Payer: BC Managed Care – PPO | Admitting: Internal Medicine

## 2018-10-14 ENCOUNTER — Other Ambulatory Visit (INDEPENDENT_AMBULATORY_CARE_PROVIDER_SITE_OTHER): Payer: BC Managed Care – PPO

## 2018-10-14 ENCOUNTER — Other Ambulatory Visit: Payer: Self-pay

## 2018-10-14 ENCOUNTER — Encounter: Payer: Self-pay | Admitting: Internal Medicine

## 2018-10-14 VITALS — BP 130/86 | HR 64 | Temp 98.1°F | Ht 68.0 in | Wt 158.0 lb

## 2018-10-14 DIAGNOSIS — E559 Vitamin D deficiency, unspecified: Secondary | ICD-10-CM

## 2018-10-14 DIAGNOSIS — Z114 Encounter for screening for human immunodeficiency virus [HIV]: Secondary | ICD-10-CM | POA: Diagnosis not present

## 2018-10-14 DIAGNOSIS — Z0000 Encounter for general adult medical examination without abnormal findings: Secondary | ICD-10-CM | POA: Diagnosis not present

## 2018-10-14 DIAGNOSIS — E538 Deficiency of other specified B group vitamins: Secondary | ICD-10-CM

## 2018-10-14 DIAGNOSIS — D509 Iron deficiency anemia, unspecified: Secondary | ICD-10-CM | POA: Diagnosis not present

## 2018-10-14 DIAGNOSIS — E119 Type 2 diabetes mellitus without complications: Secondary | ICD-10-CM

## 2018-10-14 DIAGNOSIS — Z20822 Contact with and (suspected) exposure to covid-19: Secondary | ICD-10-CM

## 2018-10-14 LAB — CBC WITH DIFFERENTIAL/PLATELET
Basophils Absolute: 0 10*3/uL (ref 0.0–0.1)
Basophils Relative: 0.7 % (ref 0.0–3.0)
Eosinophils Absolute: 0.2 10*3/uL (ref 0.0–0.7)
Eosinophils Relative: 3.1 % (ref 0.0–5.0)
HCT: 38 % (ref 36.0–46.0)
Hemoglobin: 13.1 g/dL (ref 12.0–15.0)
Lymphocytes Relative: 48.5 % — ABNORMAL HIGH (ref 12.0–46.0)
Lymphs Abs: 2.9 10*3/uL (ref 0.7–4.0)
MCHC: 34.4 g/dL (ref 30.0–36.0)
MCV: 84.9 fl (ref 78.0–100.0)
Monocytes Absolute: 0.4 10*3/uL (ref 0.1–1.0)
Monocytes Relative: 7.1 % (ref 3.0–12.0)
Neutro Abs: 2.4 10*3/uL (ref 1.4–7.7)
Neutrophils Relative %: 40.6 % — ABNORMAL LOW (ref 43.0–77.0)
Platelets: 209 10*3/uL (ref 150.0–400.0)
RBC: 4.47 Mil/uL (ref 3.87–5.11)
RDW: 15.3 % (ref 11.5–15.5)
WBC: 5.9 10*3/uL (ref 4.0–10.5)

## 2018-10-14 LAB — URINALYSIS, ROUTINE W REFLEX MICROSCOPIC
Bilirubin Urine: NEGATIVE
Hgb urine dipstick: NEGATIVE
Ketones, ur: NEGATIVE
Nitrite: NEGATIVE
Specific Gravity, Urine: 1.02 (ref 1.000–1.030)
Total Protein, Urine: NEGATIVE
Urine Glucose: NEGATIVE
Urobilinogen, UA: 0.2 (ref 0.0–1.0)
pH: 7 (ref 5.0–8.0)

## 2018-10-14 LAB — IBC PANEL
Iron: 99 ug/dL (ref 42–145)
Saturation Ratios: 36.3 % (ref 20.0–50.0)
Transferrin: 195 mg/dL — ABNORMAL LOW (ref 212.0–360.0)

## 2018-10-14 LAB — HEPATIC FUNCTION PANEL
ALT: 11 U/L (ref 0–35)
AST: 14 U/L (ref 0–37)
Albumin: 3.8 g/dL (ref 3.5–5.2)
Alkaline Phosphatase: 94 U/L (ref 39–117)
Bilirubin, Direct: 0.1 mg/dL (ref 0.0–0.3)
Total Bilirubin: 0.6 mg/dL (ref 0.2–1.2)
Total Protein: 6.8 g/dL (ref 6.0–8.3)

## 2018-10-14 LAB — MICROALBUMIN / CREATININE URINE RATIO
Creatinine,U: 204.4 mg/dL
Microalb Creat Ratio: 0.7 mg/g (ref 0.0–30.0)
Microalb, Ur: 1.4 mg/dL (ref 0.0–1.9)

## 2018-10-14 LAB — BASIC METABOLIC PANEL
BUN: 13 mg/dL (ref 6–23)
CO2: 32 mEq/L (ref 19–32)
Calcium: 9.5 mg/dL (ref 8.4–10.5)
Chloride: 103 mEq/L (ref 96–112)
Creatinine, Ser: 0.95 mg/dL (ref 0.40–1.20)
GFR: 71.44 mL/min (ref >60.00)
Glucose, Bld: 132 mg/dL — ABNORMAL HIGH (ref 70–99)
Potassium: 4.1 mEq/L (ref 3.5–5.1)
Sodium: 141 mEq/L (ref 135–145)

## 2018-10-14 LAB — LIPID PANEL
Cholesterol: 171 mg/dL (ref 0–200)
HDL: 79.9 mg/dL (ref >39.00)
LDL Cholesterol: 82 mg/dL (ref 0–99)
NonHDL: 90.88
Total CHOL/HDL Ratio: 2
Triglycerides: 43 mg/dL (ref 0.0–149.0)
VLDL: 8.6 mg/dL (ref 0.0–40.0)

## 2018-10-14 LAB — TSH: TSH: 5.46 u[IU]/mL — ABNORMAL HIGH (ref 0.35–4.50)

## 2018-10-14 LAB — VITAMIN D 25 HYDROXY (VIT D DEFICIENCY, FRACTURES): VITD: 62.45 ng/mL (ref 30.00–100.00)

## 2018-10-14 LAB — VITAMIN B12: Vitamin B-12: 627 pg/mL (ref 211–911)

## 2018-10-14 LAB — HEMOGLOBIN A1C: Hgb A1c MFr Bld: 6 % (ref 4.6–6.5)

## 2018-10-14 NOTE — Patient Instructions (Signed)

## 2018-10-14 NOTE — Telephone Encounter (Signed)
Patient returned call for covid testing. Appointment scheduled for tomorrow at 0900 at [facility], advised of location and to wear a mask for everyone in the vehicle, she verbalized understanding.

## 2018-10-14 NOTE — Telephone Encounter (Signed)
Left voicemail for patient to return call regarding scheduling COVID 19 test at both of patient's phone numbers.

## 2018-10-14 NOTE — Telephone Encounter (Signed)
Please schedule pt for covid19 testing per PCP. Thanks! 

## 2018-10-14 NOTE — Progress Notes (Signed)
Subjective:    Patient ID: Penny Yates, female    DOB: 04/14/1956, 63 y.o.   MRN: [id number]  HPI  Here for wellness and f/u;  Overall doing ok;  Pt denies Chest pain, worsening SOB, DOE, wheezing, orthopnea, PND, worsening LE edema, palpitations, dizziness or syncope.  Pt denies neurological change such as new headache, facial or extremity weakness.  Pt denies polydipsia, polyuria, or low sugar symptoms. Pt states overall good compliance with treatment and medications, good tolerability, and has been trying to follow appropriate diet.  Pt denies worsening depressive symptoms, suicidal ideation or panic. No fever, night sweats, wt loss, loss of appetite, or other constitutional symptoms.  Pt states good ability with ADL's, has low fall risk, home safety reviewed and adequate, no other significant changes in hearing or vision, and only occasionally active with exercise.  Son died 2 yrs ago from flu at 844yo. 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  No new complaints Past Medical History:  Diagnosis Date  . Anemia   . Chronic venous insufficiency    LE's  . Depression    pt denies  . Diabetes mellitus without complication (HCC)   . Fracture of shaft of right radius 01/06/2013  . History of colonic polyps    multiple of succeeding years 96, 97, 98, and 99  . Hyperlipidemia   . Hypertension   . Hyperthyroidism    s/p rad Iodine-ellison  . Hypothyroidism 01/19/2015  . Leg length discrepancy    right leg longer  . Low back pain   . Lymphedema    left arm, both legs; primary lymphedema  . Sacroiliitis (HCC)    history of  . Sciatica    Past Surgical History:  Procedure Laterality Date  . [place]  [place]in [place]  . EYE SURGERY     both eyes-muscle repair  . OPEN REDUCTION INTERNAL FIXATION (ORIF) DISTAL RADIAL FRACTURE Right 01/06/2013   Procedure: RIGHT OPEN REDUCTION INTERNAL FIXATION (ORIF) DISTAL ARTICULATING RADIAL FRACTURE ;  Surgeon: Eulas PostJoshua P  Landau, MD;  Location: [facility];  Service: Orthopedics;  Laterality: Right;  . 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 REVISION OF SCAR TISSUE RECTUS MUSCLE  11/08   right knee  . TUBAL LIGATION      reports that she has never smoked. She has never used smokeless tobacco. She reports that she does not drink alcohol or use drugs. family history includes Cancer in her mother; Colon cancer (age of onset: 2654) in her mother; Diabetes in her mother and unknown relative; Heart disease in her maternal grandmother; Kidney disease in her unknown relative; Stroke in her unknown relative. Allergies  Allergen Reactions  . Ace Inhibitors Cough  . Lisinopril Other (See Comments)    Cough   Current Outpatient Medications on File Prior to Visit  Medication Sig Dispense Refill  . ALPHAGAN P 0.1 % SOLN INSTILL 1 DROP INTO BOTH EYES TWICE A DAY  11  . amoxicillin (AMOXIL) 500 MG capsule Take 1,000 mg by mouth See admin instructions. Take 2 capsules by mouth 1 hr prior to dental procedure, then 2 capsules 4 hrs after  1  . amoxicillin-clavulanate (AUGMENTIN) 875-125 MG tablet Take 1 tablet by mouth 2 (two) times daily. Take for 10 days. 20 tablet 0  . aspirin 81 MG tablet Take 81 mg by mouth daily.      Marland Kitchen. atorvastatin (LIPITOR) 10 MG tablet TAKE  1 TABLET BY MOUTH EVERY DAY 90 tablet 0  . Biotin 5000 MCG CAPS Take 5,000 mcg by mouth daily.     . CALCIUM CARBONATE-VITAMarland KitchenMIN D PO Take 1 tablet by mouth daily. Take one daily      . diltiazem (CARDIZEM CD) 360 MG 24 hr capsule TAKE 1 CAPSULE (360 MG TOTAL) BY MOUTH DAILY. 90 capsule 0  . docusate sodium (COLACE) 100 MG capsule Take 1 capsule (100 mg total) by mouth every 12 (twelve) hours. 60 capsule 0  . fish oil-omega-3 fatty acids 1000 MG capsule Take 1 capsule by mouth daily.     . furosemide (LASIX) 40 MG tablet Take 1 tablet (40 mg total) by mouth daily. 90 tablet 2  . guanFACINE (TENEX) 1 MG tablet Take 1 tablet (1 mg total) by mouth at  bedtime. 90 tablet 3  . guanFACINE (TENEX) 2 MG tablet TAKE 1 TABLET BY MOUTH AT BEDTIME 90 tablet 3  . irbesartan (AVAPRO) 300 MG tablet TAKE 1 TABLET BY MOUTH EVERY DAY 90 tablet 0  . KLOR-CON M10 10 MEQ tablet TAKE 1 TABLET BY MOUTH DAILY 90 tablet 1  . latanoprost (XALATAN) 0.005 % ophthalmic solution Place 1 drop into both eyes at bedtime.  4  . levothyroxine (SYNTHROID, LEVOTHROID) 75 MCG tablet TAKE 1 TABLET BY MOUTH DAILY BEFORE BREAKFAST 90 tablet 1  . Multiple Vitamin (MULTIVITAMIN) capsule Take 1 capsule by mouth daily.      Bertram Gala. Polyethyl Glycol-Propyl Glycol (SYSTANE) 0.4-0.3 % SOLN Place 1 drop into both eyes daily as needed (dry eyes).     . polyethylene glycol (MIRALAX / GLYCOLAX) packet Take 17 g by mouth daily. 14 each 0  . Probiotic Product (PHILLIPS COLON HEALTH PO) Take by mouth.    . traMADol (ULTRAM-ER) 200 MG 24 hr tablet Take 1 tablet (200 mg total) by mouth daily. 30 tablet 0   No current facility-administered medications on file prior to visit.    Review of Systems Constitutional: Negative for other unusual diaphoresis, sweats, appetite or weight changes HENT: Negative for other worsening hearing loss, ear pain, facial swelling, mouth sores or neck stiffness.   Eyes: Negative for other worsening pain, redness or other visual disturbance.  Respiratory: Negative for other stridor or swelling Cardiovascular: Negative for other palpitations or other chest pain  Gastrointestinal: Negative for worsening diarrhea or loose stools, blood in stool, distention or other pain Genitourinary: Negative for hematuria, flank pain or other change in urine volume.  Musculoskeletal: Negative for myalgias or other joint swelling.  Skin: Negative for other color change, or other wound or worsening drainage.  Neurological: Negative for other syncope or numbness. Hematological: Negative for other adenopathy or swelling Psychiatric/Behavioral: Negative for hallucinations, other worsening  agitation, SI, self-injury, or new decreased concentration All other system neg per pt    Objective:   Physical Exam BP 130/86   Pulse 64   Temp 98.1 F (36.7 C) (Oral)   Ht 5\' 8"  (1.727 m)   Wt 158 lb (71.7 kg)   SpO2 98%   BMI 24.02 kg/m  VS noted,  Constitutional: Pt is oriented to person, place, and time. Appears well-developed and well-nourished, in no significant distress and comfortable Head: Normocephalic and atraumatic  Eyes: Conjunctivae and EOM are normal. Pupils are equal, round, and reactive to light Right Ear: External ear normal without discharge Left Ear: External ear normal without discharge Nose: Nose without discharge or deformity Mouth/Throat: Oropharynx is without other ulcerations and moist  Neck: Normal  range of motion. Neck supple. No JVD present. No tracheal deviation present or significant neck LA or mass Cardiovascular: Normal rate, regular rhythm, normal heart sounds and intact distal pulses.   Pulmonary/Chest: WOB normal and breath sounds without rales or wheezing  Abdominal: Soft. Bowel sounds are normal. NT. No HSM  Musculoskeletal: Normal range of motion. Exhibits no edema Lymphadenopathy: Has no other cervical adenopathy.  Neurological: Pt is alert and oriented to person, place, and time. Pt has normal reflexes. No cranial nerve deficit. Motor grossly intact, Gait intact Skin: Skin is warm and dry. No rash noted or new ulcerations Psychiatric:  Has normal mood and affect. Behavior is normal without agitation No other exam findings Lab Results  Component Value Date   WBC 6.1 10/09/2017   HGB 12.3 10/09/2017   HCT 34.7 (L) 10/09/2017   PLT 221 10/09/2017   GLUCOSE 89 10/09/2017   CHOL 164 06/24/2017   TRIG 45.0 06/24/2017   HDL 89.10 06/24/2017   LDLDIRECT 131.2 10/27/2008   LDLCALC 66 06/24/2017   ALT 16 10/09/2017   AST 17 10/09/2017   NA 142 10/09/2017   K 4.0 10/09/2017   CL 103 10/09/2017   CREATININE 1.06 (H) 10/09/2017   BUN 22  (H) 10/09/2017   CO2 33 (H) 10/09/2017   TSH 1.96 06/24/2017   HGBA1C 5.7 06/24/2017   MICROALBUR 4.7 (H) 06/24/2017       Assessment & Plan:

## 2018-10-14 NOTE — Telephone Encounter (Signed)
-----   Message from Biagio Borg, MD sent at 10/14/2018  8:48 AM EDT ----- Regarding: covid testing Plesae send message to [facility] testing pool for referral for COVID testing

## 2018-10-15 ENCOUNTER — Other Ambulatory Visit: Payer: Self-pay

## 2018-10-15 DIAGNOSIS — Z20822 Contact with and (suspected) exposure to covid-19: Secondary | ICD-10-CM

## 2018-10-15 LAB — HIV ANTIBODY (ROUTINE TESTING W REFLEX): HIV 1&2 Ab, 4th Generation: NONREACTIVE

## 2018-10-17 ENCOUNTER — Encounter: Payer: Self-pay | Admitting: Internal Medicine

## 2018-10-17 NOTE — Assessment & Plan Note (Signed)

## 2018-10-17 NOTE — Assessment & Plan Note (Signed)
stable overall by history and exam, recent data reviewed with pt, and pt to continue medical treatment as before,  to f/u any worsening symptoms or concerns  

## 2018-10-17 NOTE — Assessment & Plan Note (Signed)
Also for f/u iron 

## 2018-10-19 LAB — NOVEL CORONAVIRUS, NAA: SARS-CoV-2, NAA: NOT DETECTED

## 2018-10-21 ENCOUNTER — Encounter: Payer: Self-pay | Admitting: Internal Medicine

## 2018-11-07 ENCOUNTER — Other Ambulatory Visit: Payer: Self-pay | Admitting: Internal Medicine

## 2018-11-07 NOTE — Telephone Encounter (Signed)
Done erx 

## 2018-11-07 NOTE — Telephone Encounter (Signed)
[place] Controlled Database Checked Last filled: 10/09/18 # 30 LOV w/you: 10/14/18  Next appt w/you: 10/15/2019

## 2018-12-02 ENCOUNTER — Other Ambulatory Visit: Payer: Self-pay | Admitting: Internal Medicine

## 2018-12-04 ENCOUNTER — Encounter: Payer: Self-pay | Admitting: Internal Medicine

## 2018-12-05 ENCOUNTER — Encounter: Payer: Self-pay | Admitting: Internal Medicine

## 2018-12-15 NOTE — Telephone Encounter (Signed)
Sent MyChart msg to patient to contact MR directly.

## 2018-12-16 ENCOUNTER — Other Ambulatory Visit: Payer: Self-pay | Admitting: Internal Medicine

## 2018-12-30 ENCOUNTER — Other Ambulatory Visit: Payer: Self-pay | Admitting: Internal Medicine

## 2019-01-07 ENCOUNTER — Other Ambulatory Visit: Payer: Self-pay | Admitting: Internal Medicine

## 2019-01-09 ENCOUNTER — Other Ambulatory Visit: Payer: Self-pay | Admitting: Internal Medicine

## 2019-01-22 LAB — HM DIABETES EYE EXAM

## 2019-02-11 ENCOUNTER — Other Ambulatory Visit: Payer: Self-pay | Admitting: Internal Medicine

## 2019-02-13 ENCOUNTER — Encounter: Payer: Self-pay | Admitting: Internal Medicine

## 2019-02-13 NOTE — Progress Notes (Signed)
Outside notes received. Information abstracted. Notes sent to scan.  

## 2019-04-11 ENCOUNTER — Other Ambulatory Visit: Payer: Self-pay | Admitting: Internal Medicine

## 2019-04-30 ENCOUNTER — Encounter: Payer: Self-pay | Admitting: Internal Medicine

## 2019-04-30 NOTE — Telephone Encounter (Signed)
Staff to contact pt  I have done work excuse letter, so please fax if pt able to give work fax number

## 2019-05-01 ENCOUNTER — Encounter: Payer: Self-pay | Admitting: Internal Medicine

## 2019-05-01 ENCOUNTER — Telehealth: Payer: Self-pay | Admitting: Internal Medicine

## 2019-05-01 NOTE — Telephone Encounter (Signed)
Called pt to inform her we're faxing letter this am../lmb

## 2019-05-01 NOTE — Telephone Encounter (Signed)
Re-faxed letter and received confirmation it went through.Marland KitchenRaechel Chute

## 2019-05-01 NOTE — Telephone Encounter (Signed)
Patient called and would like to let CMA Lucy that the fax did not come thru and if she can please refax the letter please.

## 2019-05-13 ENCOUNTER — Other Ambulatory Visit: Payer: Self-pay | Admitting: Internal Medicine

## 2019-05-13 NOTE — Telephone Encounter (Signed)
Done erx 

## 2019-05-19 ENCOUNTER — Ambulatory Visit: Payer: BC Managed Care – PPO

## 2019-05-28 ENCOUNTER — Ambulatory Visit: Payer: BC Managed Care – PPO | Attending: Internal Medicine

## 2019-05-28 DIAGNOSIS — Z23 Encounter for immunization: Secondary | ICD-10-CM | POA: Insufficient documentation

## 2019-05-28 NOTE — Progress Notes (Signed)
   Covid-19 Vaccination Clinic  Name:  AKIERA ALLBAUGH    MRN: [id number] DOB: 06/08/1955  05/28/2019  Ms. Wolanski was observed post Covid-19 immunization for 30 minutes based on pre-vaccination screening without incidence. She was provided with Vaccine Information Sheet and instruction to access the V-Safe system.   Ms. Silvestro was instructed to call 911 with any severe reactions post vaccine: Marland Kitchen Difficulty breathing  . Swelling of your face and throat  . A fast heartbeat  . A bad rash all over your body  . Dizziness and weakness    Immunizations Administered    Name Date Dose VIS Date Route   Pfizer COVID-19 Vaccine 05/28/2019  9:26 AM 0.3 mL 04/03/2019 Intramuscular   Manufacturer: [employer], [employer]   Lot: [id number]   NDC: [address]-9

## 2019-05-30 ENCOUNTER — Ambulatory Visit: Payer: BC Managed Care – PPO

## 2019-06-22 ENCOUNTER — Ambulatory Visit: Payer: BC Managed Care – PPO | Attending: Internal Medicine

## 2019-06-22 DIAGNOSIS — Z23 Encounter for immunization: Secondary | ICD-10-CM

## 2019-06-22 NOTE — Progress Notes (Signed)
   Covid-19 Vaccination Clinic  Name:  Penny Yates    MRN: [id number] DOB: 12-Dec-1955  06/22/2019  [name] was observed post Covid-19 immunization for 15 minutes without incidence. She was provided with Vaccine Information Sheet and instruction to access the V-Safe system.   [name] was instructed to call 911 with any severe reactions post vaccine: Marland Kitchen Difficulty breathing  . Swelling of your face and throat  . A fast heartbeat  . A bad rash all over your body  . Dizziness and weakness    Immunizations Administered    Name Date Dose VIS Date Route   Pfizer COVID-19 Vaccine 06/22/2019  2:46 PM 0.3 mL 04/03/2019 Intramuscular   Manufacturer: [employer], [employer]   Lot: [id number]   NDC: [address]-1

## 2019-07-08 ENCOUNTER — Other Ambulatory Visit: Payer: Self-pay | Admitting: Internal Medicine

## 2019-07-08 NOTE — Telephone Encounter (Signed)
Please refill as per office routine med refill policy (all routine meds refilled for 3 mo or monthly per pt preference up to one year from last visit, then month to month grace period for 3 mo, then further med refills will have to be denied)  

## 2019-09-15 ENCOUNTER — Telehealth: Payer: Self-pay | Admitting: Internal Medicine

## 2019-09-15 DIAGNOSIS — E2839 Other primary ovarian failure: Secondary | ICD-10-CM

## 2019-09-15 NOTE — Telephone Encounter (Signed)
New Message:    Pt is calling to see if a order can be placed for a bone density scan and labs so the results will be back in time to discuss at her June appt. Please advise.

## 2019-09-15 NOTE — Telephone Encounter (Signed)
Order done for [place] elam

## 2019-09-15 NOTE — Telephone Encounter (Signed)
dxa ordered 

## 2019-09-28 NOTE — Telephone Encounter (Signed)
LVM for patient to call and scheduled bone density

## 2019-10-07 ENCOUNTER — Encounter: Payer: Self-pay | Admitting: Internal Medicine

## 2019-10-07 DIAGNOSIS — M25569 Pain in unspecified knee: Secondary | ICD-10-CM

## 2019-10-08 ENCOUNTER — Other Ambulatory Visit: Payer: Self-pay

## 2019-10-08 ENCOUNTER — Ambulatory Visit (INDEPENDENT_AMBULATORY_CARE_PROVIDER_SITE_OTHER)
Admission: RE | Admit: 2019-10-08 | Discharge: 2019-10-08 | Disposition: A | Discharge status: Home / Self Care | Payer: BC Managed Care – PPO | Source: Ambulatory Visit | Attending: Internal Medicine | Admitting: Internal Medicine

## 2019-10-08 ENCOUNTER — Other Ambulatory Visit (INDEPENDENT_AMBULATORY_CARE_PROVIDER_SITE_OTHER): Payer: BC Managed Care – PPO

## 2019-10-08 DIAGNOSIS — E119 Type 2 diabetes mellitus without complications: Secondary | ICD-10-CM | POA: Diagnosis not present

## 2019-10-08 DIAGNOSIS — E2839 Other primary ovarian failure: Secondary | ICD-10-CM | POA: Diagnosis not present

## 2019-10-08 LAB — URINALYSIS, ROUTINE W REFLEX MICROSCOPIC
Bilirubin Urine: NEGATIVE
Hgb urine dipstick: NEGATIVE
Ketones, ur: NEGATIVE
Leukocytes,Ua: NEGATIVE
Nitrite: NEGATIVE
Specific Gravity, Urine: 1.02 (ref 1.000–1.030)
Total Protein, Urine: NEGATIVE
Urine Glucose: NEGATIVE
Urobilinogen, UA: 0.2 (ref 0.0–1.0)
pH: 6.5 (ref 5.0–8.0)

## 2019-10-08 LAB — CBC WITH DIFFERENTIAL/PLATELET
Basophils Absolute: 0.1 10*3/uL (ref 0.0–0.1)
Basophils Relative: 1 % (ref 0.0–3.0)
Eosinophils Absolute: 0.2 10*3/uL (ref 0.0–0.7)
Eosinophils Relative: 2.6 % (ref 0.0–5.0)
HCT: 37.7 % (ref 36.0–46.0)
Hemoglobin: 13.1 g/dL (ref 12.0–15.0)
Lymphocytes Relative: 48.7 % — ABNORMAL HIGH (ref 12.0–46.0)
Lymphs Abs: 3.1 10*3/uL (ref 0.7–4.0)
MCHC: 34.7 g/dL (ref 30.0–36.0)
MCV: 85.8 fl (ref 78.0–100.0)
Monocytes Absolute: 0.4 10*3/uL (ref 0.1–1.0)
Monocytes Relative: 6.9 % (ref 3.0–12.0)
Neutro Abs: 2.6 10*3/uL (ref 1.4–7.7)
Neutrophils Relative %: 40.8 % — ABNORMAL LOW (ref 43.0–77.0)
Platelets: 243 10*3/uL (ref 150.0–400.0)
RBC: 4.4 Mil/uL (ref 3.87–5.11)
RDW: 15.5 % (ref 11.5–15.5)
WBC: 6.4 10*3/uL (ref 4.0–10.5)

## 2019-10-08 LAB — BASIC METABOLIC PANEL
BUN: 14 mg/dL (ref 6–23)
CO2: 30 mEq/L (ref 19–32)
Calcium: 9.9 mg/dL (ref 8.4–10.5)
Chloride: 105 mEq/L (ref 96–112)
Creatinine, Ser: 0.98 mg/dL (ref 0.40–1.20)
GFR: 68.71 mL/min (ref >60.00)
Glucose, Bld: 138 mg/dL — ABNORMAL HIGH (ref 70–99)
Potassium: 4.4 mEq/L (ref 3.5–5.1)
Sodium: 141 mEq/L (ref 135–145)

## 2019-10-08 LAB — MICROALBUMIN / CREATININE URINE RATIO
Creatinine,U: 271.6 mg/dL
Microalb Creat Ratio: 1 mg/g (ref 0.0–30.0)
Microalb, Ur: 2.7 mg/dL — ABNORMAL HIGH (ref 0.0–1.9)

## 2019-10-08 LAB — LIPID PANEL
Cholesterol: 171 mg/dL (ref 0–200)
HDL: 86.8 mg/dL (ref >39.00)
LDL Cholesterol: 73 mg/dL (ref 0–99)
NonHDL: 84.34
Total CHOL/HDL Ratio: 2
Triglycerides: 57 mg/dL (ref 0.0–149.0)
VLDL: 11.4 mg/dL (ref 0.0–40.0)

## 2019-10-08 LAB — TSH: TSH: 3.64 u[IU]/mL (ref 0.35–4.50)

## 2019-10-08 LAB — HEPATIC FUNCTION PANEL
ALT: 11 U/L (ref 0–35)
AST: 14 U/L (ref 0–37)
Albumin: 3.9 g/dL (ref 3.5–5.2)
Alkaline Phosphatase: 92 U/L (ref 39–117)
Bilirubin, Direct: 0.1 mg/dL (ref 0.0–0.3)
Total Bilirubin: 0.5 mg/dL (ref 0.2–1.2)
Total Protein: 7.2 g/dL (ref 6.0–8.3)

## 2019-10-08 LAB — HEMOGLOBIN A1C: Hgb A1c MFr Bld: 5.8 % (ref 4.6–6.5)

## 2019-10-11 ENCOUNTER — Other Ambulatory Visit: Payer: Self-pay | Admitting: Internal Medicine

## 2019-10-11 MED ORDER — ALENDRONATE SODIUM 70 MG PO TABS: 70.0000 mg | ORAL_TABLET | ORAL | 3 refills | Status: DC

## 2019-10-14 ENCOUNTER — Other Ambulatory Visit: Payer: Self-pay | Admitting: Internal Medicine

## 2019-10-14 NOTE — Telephone Encounter (Signed)
Please refill as per office routine med refill policy (all routine meds refilled for 3 mo or monthly per pt preference up to one year from last visit, then month to month grace period for 3 mo, then further med refills will have to be denied)  

## 2019-10-15 ENCOUNTER — Telehealth: Payer: Self-pay

## 2019-10-15 ENCOUNTER — Encounter: Payer: Self-pay | Admitting: Internal Medicine

## 2019-10-15 ENCOUNTER — Other Ambulatory Visit: Payer: Self-pay

## 2019-10-15 ENCOUNTER — Ambulatory Visit (INDEPENDENT_AMBULATORY_CARE_PROVIDER_SITE_OTHER): Payer: BC Managed Care – PPO | Admitting: Internal Medicine

## 2019-10-15 VITALS — BP 170/100 | HR 84 | Temp 98.2°F | Ht 68.0 in | Wt 160.0 lb

## 2019-10-15 DIAGNOSIS — R739 Hyperglycemia, unspecified: Secondary | ICD-10-CM

## 2019-10-15 DIAGNOSIS — Z23 Encounter for immunization: Secondary | ICD-10-CM

## 2019-10-15 DIAGNOSIS — E538 Deficiency of other specified B group vitamins: Secondary | ICD-10-CM | POA: Diagnosis not present

## 2019-10-15 DIAGNOSIS — E559 Vitamin D deficiency, unspecified: Secondary | ICD-10-CM | POA: Diagnosis not present

## 2019-10-15 DIAGNOSIS — Z0000 Encounter for general adult medical examination without abnormal findings: Secondary | ICD-10-CM

## 2019-10-15 DIAGNOSIS — M810 Age-related osteoporosis without current pathological fracture: Secondary | ICD-10-CM

## 2019-10-15 DIAGNOSIS — I10 Essential (primary) hypertension: Secondary | ICD-10-CM

## 2019-10-15 MED ORDER — TRAMADOL HCL ER 200 MG PO TB24: 200.0000 mg | ORAL_TABLET | Freq: Every day | ORAL | 5 refills | Status: DC

## 2019-10-15 MED ORDER — GUANFACINE HCL 2 MG PO TABS: 2.0000 mg | ORAL_TABLET | Freq: Every day | ORAL | 3 refills | Status: DC

## 2019-10-15 MED ORDER — FUROSEMIDE 40 MG PO TABS: 40.0000 mg | ORAL_TABLET | Freq: Every day | ORAL | 3 refills | Status: DC | PRN

## 2019-10-15 NOTE — Telephone Encounter (Signed)
Key: [id number]

## 2019-10-15 NOTE — Patient Instructions (Addendum)
You had the Pneumovax pneumonia shot today  OK to start the fosamax 70 mg weekly, though you could let us know if you prefer the prolia instead  Please continue all other medications as before, and refills have been done if requested.  Please have the pharmacy call with any other refills you may need.  Please continue your efforts at being more active, low cholesterol diet, and weight control.  You are otherwise up to date with prevention measures today.  Please keep your appointments with your specialists as you may have planned  Please make an Appointment to return for your 1 year visit, or sooner if needed, with Lab testing by Appointment as well, to be done about 3-5 days before at the FIRST FLOOR Lab (so this is for TWO appointments - please see the scheduling desk as you leave)

## 2019-10-15 NOTE — Progress Notes (Signed)
Subjective:    Patient ID: Penny Yates, female    DOB: [birth year], 64 y.o.   MRN: [id number]  HPI  Here for wellness and f/u;  Overall doing ok;  Pt denies Chest pain, worsening SOB, DOE, wheezing, orthopnea, PND, worsening LE edema, palpitations, dizziness or syncope.  Pt denies neurological change such as new headache, facial or extremity weakness.  Pt denies polydipsia, polyuria, or low sugar symptoms. Pt states overall good compliance with treatment and medications, good tolerability, and has been trying to follow appropriate diet.  Pt denies worsening depressive symptoms, suicidal ideation or panic. No fever, night sweats, wt loss, loss of appetite, or other constitutional symptoms.  Pt states good ability with ADL's, has low fall risk, home safety reviewed and adequate, no other significant changes in hearing or vision, and only occasionally active with exercise. BP at home < 140/90, but not checked recently, and not taking the lasix recently except for occasionally prn.  Leg swelling better controlled with elevation working from home, and has recent leg cramps.  Still taking tenex qhs, Also to see Dr Wynelle Link for the right knee with pain x 2 mo, now 15 yrs s/p TKR.  Past Medical History:  Diagnosis Date   Anemia    Chronic venous insufficiency    LE's   Depression    pt denies   Diabetes mellitus without complication ([place])    Fracture of shaft of right radius 01/06/2013   History of colonic polyps    multiple of succeeding years 96, 97, 98, and 99   Hyperlipidemia    Hypertension    Hyperthyroidism    s/p rad Iodine-[facility]   Hypothyroidism 01/19/2015   Leg length discrepancy    right leg longer   Low back pain    Lymphedema    left arm, both legs; primary lymphedema   Sacroiliitis ([facility])    history of   Sciatica    Past Surgical History:  Procedure Laterality Date   [place]   EYE SURGERY     both eyes-muscle repair   OPEN REDUCTION  INTERNAL FIXATION (ORIF) DISTAL RADIAL FRACTURE Right 01/06/2013   Procedure: RIGHT OPEN REDUCTION INTERNAL FIXATION (ORIF) DISTAL ARTICULATING RADIAL FRACTURE ;  Surgeon: Johnny Bridge, MD;  Location: [facility];  Service: Orthopedics;  Laterality: Right;   REPLACEMENT TOTAL KNEE  june 2007   right   REVISION OF SCAR TISSUE RECTUS MUSCLE  11/08   right knee   TUBAL LIGATION      reports that she has never smoked. She has never used smokeless tobacco. She reports that she does not drink alcohol and does not use drugs. family history includes Cancer in her mother; Colon cancer (age of onset: 3) in her mother; Diabetes in her mother and unknown relative; Heart disease in her maternal grandmother; Kidney disease in her unknown relative; Stroke in her unknown relative. Allergies  Allergen Reactions   Ace Inhibitors Cough   Lisinopril Other (See Comments)    Cough   Current Outpatient Medications on File Prior to Visit  Medication Sig Dispense Refill   alendronate (FOSAMAX) 70 MG tablet Take 1 tablet (70 mg total) by mouth every 7 (seven) days. Take with a full glass of water on an empty stomach. 12 tablet 3   ALPHAGAN P 0.1 % SOLN INSTILL 1 DROP INTO BOTH EYES TWICE A DAY  11   amoxicillin (AMOXIL) 500 MG capsule Take 1,000 mg by mouth See admin instructions.  Take 2 capsules by mouth 1 hr prior to dental procedure, then 2 capsules 4 hrs after  1   amoxicillin-clavulanate (AUGMENTIN) 875-125 MG tablet Take 1 tablet by mouth 2 (two) times daily. Take for 10 days. 20 tablet 0   aspirin 81 MG tablet Take 81 mg by mouth daily.       atorvastatin (LIPITOR) 10 MG tablet TAKE 1 TABLET BY MOUTH EVERY DAY 90 tablet 3   Biotin 5000 MCG CAPS Take 5,000 mcg by mouth daily.      CALCIUM CARBONATE-VITAMIN D PO Take 1 tablet by mouth daily. Take one daily       diltiazem (CARDIZEM CD) 360 MG 24 hr capsule TAKE 1 CAPSULE BY MOUTH EVERY DAY 90 capsule 1   docusate sodium  (COLACE) 100 MG capsule Take 1 capsule (100 mg total) by mouth every 12 (twelve) hours. 60 capsule 0   fish oil-omega-3 fatty acids 1000 MG capsule Take 1 capsule by mouth daily.      influenza vaccine (FLUCELVAX QUADRIVALENT) 0.5 ML injection Flucelvax Quad 2019-2020 (PF) 60 mcg (15 mcg x 4)/0.5 mL IM syringe  TO BE ADMINISTERED BY PHARMACIST FOR IMMUNIZATION     irbesartan (AVAPRO) 300 MG tablet TAKE 1 TABLET BY MOUTH EVERY DAY 90 tablet 1   KLOR-CON M10 10 MEQ tablet TAKE 1 TABLET BY MOUTH EVERY DAY 90 tablet 3   latanoprost (XALATAN) 0.005 % ophthalmic solution Place 1 drop into both eyes at bedtime.  4   levothyroxine (SYNTHROID) 75 MCG tablet TAKE 1 TABLET BY MOUTH EVERY DAY BEFORE BREAKFAST 90 tablet 3   Multiple Vitamin (MULTIVITAMIN) capsule Take 1 capsule by mouth daily.       Polyethyl Glycol-Propyl Glycol (SYSTANE) 0.4-0.3 % SOLN Place 1 drop into both eyes daily as needed (dry eyes).      polyethylene glycol (MIRALAX / GLYCOLAX) packet Take 17 g by mouth daily. 14 each 0   Probiotic Product (PHILLIPS COLON HEALTH PO) Take by mouth.     No current facility-administered medications on file prior to visit.   Review of Systems All otherwise neg per pt    Objective:   Physical Exam BP (!) 170/100 (BP Location: Left Arm, Patient Position: Sitting, Cuff Size: Large)    Pulse 84    Temp 98.2 F (36.8 C) (Oral)    Ht 5\' 8"  (1.727 m)    Wt 160 lb (72.6 kg)    SpO2 99%    BMI 24.33 kg/m  VS noted,  Constitutional: Pt appears in NAD HENT: Head: NCAT.  Right Ear: External ear normal.  Left Ear: External ear normal.  Eyes: . Pupils are equal, round, and reactive to light. Conjunctivae and EOM are normal Nose: without d/c or deformity Neck: Neck supple. Gross normal ROM Cardiovascular: Normal rate and regular rhythm.   Pulmonary/Chest: Effort normal and breath sounds without rales or wheezing.  Abd:  Soft, NT, ND, + BS, no organomegaly Neurological: Pt is alert. At baseline  orientation, motor grossly intact Skin: Skin is warm. No rashes, other new lesions, no LE edema Psychiatric: Pt behavior is normal without agitation  All otherwise neg per pt Lab Results  Component Value Date   WBC 6.4 10/08/2019   HGB 13.1 10/08/2019   HCT 37.7 10/08/2019   PLT 243.0 10/08/2019   GLUCOSE 138 (H) 10/08/2019   CHOL 171 10/08/2019   TRIG 57.0 10/08/2019   HDL 86.80 10/08/2019   LDLDIRECT 131.2 10/27/2008   LDLCALC 73 10/08/2019  ALT 11 10/08/2019   AST 14 10/08/2019   NA 141 10/08/2019   K 4.4 10/08/2019   CL 105 10/08/2019   CREATININE 0.98 10/08/2019   BUN 14 10/08/2019   CO2 30 10/08/2019   TSH 3.64 10/08/2019   HGBA1C 5.8 10/08/2019   MICROALBUR 2.7 (H) 10/08/2019      Assessment & Plan:

## 2019-10-18 ENCOUNTER — Encounter: Payer: Self-pay | Admitting: Internal Medicine

## 2019-10-18 NOTE — Assessment & Plan Note (Signed)
stable overall by history and exam, recent data reviewed with pt, and pt to continue medical treatment as before,  to f/u any worsening symptoms or concerns  

## 2019-10-18 NOTE — Assessment & Plan Note (Signed)

## 2019-10-18 NOTE — Assessment & Plan Note (Signed)
D/w pt, ok to start new fosamax for total 5 yr tx

## 2019-12-04 ENCOUNTER — Other Ambulatory Visit: Payer: Self-pay | Admitting: Orthopedic Surgery

## 2019-12-04 ENCOUNTER — Other Ambulatory Visit (HOSPITAL_COMMUNITY): Payer: Self-pay | Admitting: Orthopedic Surgery

## 2019-12-04 DIAGNOSIS — Z96651 Presence of right artificial knee joint: Secondary | ICD-10-CM

## 2019-12-09 ENCOUNTER — Other Ambulatory Visit: Payer: Self-pay | Admitting: Internal Medicine

## 2019-12-09 NOTE — Telephone Encounter (Signed)
Please refill as per office routine med refill policy (all routine meds refilled for 3 mo or monthly per pt preference up to one year from last visit, then month to month grace period for 3 mo, then further med refills will have to be denied)  

## 2019-12-10 ENCOUNTER — Encounter: Payer: Self-pay | Admitting: Internal Medicine

## 2019-12-10 ENCOUNTER — Other Ambulatory Visit: Payer: Self-pay

## 2019-12-10 ENCOUNTER — Other Ambulatory Visit: Payer: Self-pay | Admitting: Internal Medicine

## 2019-12-10 MED ORDER — POTASSIUM CHLORIDE CRYS ER 10 MEQ PO TBCR: 10.0000 meq | EXTENDED_RELEASE_TABLET | Freq: Every day | ORAL | 3 refills | Status: DC

## 2019-12-10 MED ORDER — LEVOTHYROXINE SODIUM 75 MCG PO TABS: ORAL_TABLET | ORAL | 3 refills | Status: DC

## 2019-12-10 NOTE — Telephone Encounter (Signed)
Please refill as per office routine med refill policy (all routine meds refilled for 3 mo or monthly per pt preference up to one year from last visit, then month to month grace period for 3 mo, then further med refills will have to be denied)  

## 2019-12-11 ENCOUNTER — Encounter: Payer: Self-pay | Admitting: Internal Medicine

## 2019-12-11 DIAGNOSIS — N183 Chronic kidney disease, stage 3 unspecified: Secondary | ICD-10-CM

## 2019-12-14 ENCOUNTER — Encounter (HOSPITAL_COMMUNITY)
Admission: RE | Admit: 2019-12-14 | Discharge: 2019-12-14 | Disposition: A | Discharge status: Home / Self Care | Payer: BC Managed Care – PPO | Source: Ambulatory Visit | Attending: Orthopedic Surgery | Admitting: Orthopedic Surgery

## 2019-12-14 ENCOUNTER — Other Ambulatory Visit: Payer: Self-pay

## 2019-12-14 ENCOUNTER — Ambulatory Visit (HOSPITAL_COMMUNITY)
Admission: RE | Admit: 2019-12-14 | Discharge: 2019-12-14 | Disposition: A | Discharge status: Home / Self Care | Payer: BC Managed Care – PPO | Source: Ambulatory Visit | Attending: Orthopedic Surgery | Admitting: Orthopedic Surgery

## 2019-12-14 DIAGNOSIS — Z96651 Presence of right artificial knee joint: Secondary | ICD-10-CM | POA: Diagnosis present

## 2019-12-14 MED ORDER — TECHNETIUM TC 99M MEDRONATE IV KIT
19.2000 | PACK | Freq: Once | INTRAVENOUS | Status: AC | PRN
  Administered 2019-12-14: 19.2 via INTRAVENOUS

## 2019-12-20 ENCOUNTER — Other Ambulatory Visit: Payer: Self-pay | Admitting: Internal Medicine

## 2019-12-20 NOTE — Telephone Encounter (Signed)
Please refill as per office routine med refill policy (all routine meds refilled for 3 mo or monthly per pt preference up to one year from last visit, then month to month grace period for 3 mo, then further med refills will have to be denied)  

## 2019-12-31 ENCOUNTER — Encounter: Payer: Self-pay | Admitting: Internal Medicine

## 2019-12-31 MED ORDER — ATORVASTATIN CALCIUM 10 MG PO TABS
10.0000 mg | ORAL_TABLET | Freq: Every day | ORAL | 3 refills | Status: DC
Start: 2019-12-31 — End: 2020-10-26

## 2020-01-05 ENCOUNTER — Other Ambulatory Visit: Payer: Self-pay | Admitting: Internal Medicine

## 2020-01-05 NOTE — Telephone Encounter (Signed)
Please refill as per office routine med refill policy (all routine meds refilled for 3 mo or monthly per pt preference up to one year from last visit, then month to month grace period for 3 mo, then further med refills will have to be denied)  

## 2020-01-21 ENCOUNTER — Other Ambulatory Visit: Payer: Self-pay | Admitting: Nephrology

## 2020-01-21 DIAGNOSIS — N182 Chronic kidney disease, stage 2 (mild): Secondary | ICD-10-CM

## 2020-01-27 ENCOUNTER — Ambulatory Visit
Admission: RE | Admit: 2020-01-27 | Discharge: 2020-01-27 | Disposition: A | Discharge status: Home / Self Care | Payer: BC Managed Care – PPO | Source: Ambulatory Visit | Attending: Nephrology | Admitting: Nephrology

## 2020-01-27 DIAGNOSIS — N182 Chronic kidney disease, stage 2 (mild): Secondary | ICD-10-CM

## 2020-01-28 ENCOUNTER — Encounter: Payer: Self-pay | Admitting: Internal Medicine

## 2020-01-28 ENCOUNTER — Other Ambulatory Visit: Payer: Self-pay | Admitting: *Deleted

## 2020-01-28 ENCOUNTER — Telehealth: Payer: Self-pay | Admitting: *Deleted

## 2020-01-28 NOTE — Telephone Encounter (Signed)
Pt sent mychart stating she is also using Voltaren Gel and wanting it add to her med list. Updated list../lb

## 2020-02-02 ENCOUNTER — Encounter: Payer: Self-pay | Admitting: Internal Medicine

## 2020-02-02 NOTE — Telephone Encounter (Signed)
Tried calling pt there was no answer LMOM to RTC to make appt w/Dr.[name].Marland KitchenRaechel Chute

## 2020-02-02 NOTE — Telephone Encounter (Signed)
Spoke with pt and was able to inform her of her appointment for 02/08/2020 @ 2:40 pm for an EKG and BP check.  *Pt understood and has no questions or concerns at this time.

## 2020-02-08 ENCOUNTER — Encounter: Payer: Self-pay | Admitting: Internal Medicine

## 2020-02-08 ENCOUNTER — Other Ambulatory Visit: Payer: Self-pay

## 2020-02-08 ENCOUNTER — Ambulatory Visit (INDEPENDENT_AMBULATORY_CARE_PROVIDER_SITE_OTHER): Payer: BC Managed Care – PPO | Admitting: Internal Medicine

## 2020-02-08 VITALS — BP 170/98 | HR 59 | Temp 98.4°F | Ht 68.0 in | Wt 160.8 lb

## 2020-02-08 DIAGNOSIS — I890 Lymphedema, not elsewhere classified: Secondary | ICD-10-CM | POA: Diagnosis not present

## 2020-02-08 DIAGNOSIS — T8484XD Pain due to internal orthopedic prosthetic devices, implants and grafts, subsequent encounter: Secondary | ICD-10-CM

## 2020-02-08 DIAGNOSIS — I10 Essential (primary) hypertension: Secondary | ICD-10-CM | POA: Diagnosis not present

## 2020-02-08 DIAGNOSIS — R8290 Unspecified abnormal findings in urine: Secondary | ICD-10-CM | POA: Insufficient documentation

## 2020-02-08 DIAGNOSIS — N281 Cyst of kidney, acquired: Secondary | ICD-10-CM | POA: Insufficient documentation

## 2020-02-08 DIAGNOSIS — Z96659 Presence of unspecified artificial knee joint: Secondary | ICD-10-CM

## 2020-02-08 DIAGNOSIS — Z23 Encounter for immunization: Secondary | ICD-10-CM

## 2020-02-08 DIAGNOSIS — Z01810 Encounter for preprocedural cardiovascular examination: Secondary | ICD-10-CM

## 2020-02-08 MED ORDER — HYDRALAZINE HCL 50 MG PO TABS: 50.0000 mg | ORAL_TABLET | Freq: Three times a day (TID) | ORAL | 11 refills | Status: DC

## 2020-02-08 NOTE — Assessment & Plan Note (Signed)
Chronic stable,  to f/u any worsening symptoms or concerns 

## 2020-02-08 NOTE — Assessment & Plan Note (Addendum)
Has been tolerating new hydralazine 25 tid per renal for 2 wks, ok for incresaed to 50 mg tid, check renal artery u/s r/o RAS  I spent 41 minutes in preparing to see the patient by review of recent labs, imaging and procedures, obtaining and reviewing separately obtained history, communicating with the patient and family or caregiver, ordering medications, tests or procedures, and documenting clinical information in the EHR including the differential Dx, treatment, and any further evaluation and other management of htn, abnormal UA, lymphedema, knee pain, renal cyst

## 2020-02-08 NOTE — Patient Instructions (Addendum)
You had the flu shot today  Your EKG was normal today  Please request the Xalatan refill from your eye physician  Ok to increase the hydralazine to 50 mg three times per day  You will be contacted regarding the referral for:  Cardiology for preoperative consultation  You will be contacted regarding the referral for: renal artery ultrasound to check for any blockage in the artery to the kidneys  Please continue all other medications as before, and refills have been done if requested.  Please have the pharmacy call with any other refills you may need.  Please continue your efforts at being more active, low cholesterol diet, and weight control.  Please keep your appointments with your specialists as you may have planned  Please go to the LAB at the blood drawing area for the tests to be done - just the urine testing today  You will be contacted by phone if any changes need to be made immediately.  Otherwise, you will receive a letter about your results with an explanation, but please check with MyChart first.  Please remember to sign up for MyChart if you have not done so, as this will be important to you in the future with finding out test results, communicating by private email, and scheduling acute appointments online when needed.  Please make an Appointment to return in 3 months

## 2020-02-08 NOTE — Progress Notes (Addendum)
Subjective:    Patient ID: Penny Yates, female    DOB: Jun 12, 1955, 64 y.o.   MRN: [id number]  HPI  Here to f/u; overall doing ok,  Pt denies chest pain, increasing sob or doe, wheezing, orthopnea, PND, increased LE swelling, palpitations, dizziness or syncope.  Pt denies new neurological symptoms such as new headache, or facial or extremity weakness or numbness.  Pt denies polydipsia, polyuria, or low sugar episode.  Pt states overall good compliance with meds, mostly trying to follow appropriate diet, with wt overall stable,  but little exercise however. Has had no real wt gain but BP has been elevated worse in the past few months.  Due for right knee revision surgury possible in Dec 2021 but no real wt gain, no inreased wt gain, and has hx of HTN since 64 yo with meds since then.  Most recent kidney function has been normal though was told by her sister she had a recommendation from her kidney MD to have her siblings checked for something, just could not say what it was. So pt did this - . Has nw known left renal cyst per renal u/s oct 2020.  Now currently on avapro, lasix, hydralazine, cardizem, tenex.  Asking to see cardiology due to complexity of her BP control and has been several years since last visit- has already seen Dr Dorethea Clan renal but does not plan to see further for now.  Recent renal u/s Brought her BP machine and is similar to here.  Was recommended for another UA at some point, and consider f/u f/u cyst found on oct 2020 renal u/s.   BP Readings from Last 3 Encounters:  02/08/20 (!) 170/98  10/15/19 (!) 170/100  10/14/18 130/86   Wt Readings from Last 3 Encounters:  02/08/20 160 lb 12.8 oz (72.9 kg)  10/15/19 160 lb (72.6 kg)  10/14/18 158 lb (71.7 kg)   Past Medical History:  Diagnosis Date  . Anemia   . Chronic venous insufficiency    LE's  . Depression    pt denies  . Diabetes mellitus without complication ([facility])   . Fracture of shaft of right radius 01/06/2013  .  History of colonic polyps    multiple of succeeding years 96, 97, 98, and 99  . Hyperlipidemia   . Hypertension   . Hyperthyroidism    s/p rad Iodine-[facility]  . Hypothyroidism 01/19/2015  . Leg length discrepancy    right leg longer  . Low back pain   . Lymphedema    left arm, both legs; primary lymphedema  . Sacroiliitis ([facility])    history of  . Sciatica    Past Surgical History:  Procedure Laterality Date  . [place]  [place]  . EYE SURGERY     both eyes-muscle repair  . OPEN REDUCTION INTERNAL FIXATION (ORIF) DISTAL RADIAL FRACTURE Right 01/06/2013   Procedure: RIGHT OPEN REDUCTION INTERNAL FIXATION (ORIF) DISTAL ARTICULATING RADIAL FRACTURE ;  Surgeon: Eulas Post, MD;  Location: [facility];  Service: Orthopedics;  Laterality: Right;  . 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 REVISION OF SCAR TISSUE RECTUS MUSCLE  11/08   right knee  . TUBAL LIGATION      reports that she has never smoked. She has never used smokeless tobacco. She reports that she does not drink alcohol and does not use drugs. family history includes Cancer in her mother; Colon cancer (age of onset: 58) in her mother; Diabetes in  her mother and unknown relative; Heart disease in her maternal grandmother; Kidney disease in her unknown relative; Stroke in her unknown relative. Allergies  Allergen Reactions  . Ace Inhibitors Cough  . Lisinopril Other (See Comments)    Cough   Current Outpatient Medications on File Prior to Visit  Medication Sig Dispense Refill  . alendronate (FOSAMAX) 70 MG tablet Take 1 tablet (70 mg total) by mouth every 7 (seven) days. Take with a full glass of water on an empty stomach. 12 tablet 3  . ALPHAGAN P 0.1 % SOLN INSTILL 1 DROP INTO BOTH EYES TWICE A DAY  11  . amoxicillin (AMOXIL) 500 MG capsule Take 1,000 mg by mouth See admin instructions. Take 2 capsules by mouth 1 hr prior to dental procedure, then 2 capsules 4 hrs after  1  . atorvastatin  (LIPITOR) 10 MG tablet Take 1 tablet (10 mg total) by mouth daily. 90 tablet 3  . Biotin 5000 MCG CAPS Take 5,000 mcg by mouth daily.     Marland Kitchen CALCIUM CARBONATE-VITAMIN D PO Take 1 tablet by mouth daily. Take one daily      . diclofenac Sodium (VOLTAREN) 1 % GEL Apply 2 g topically 4 (four) times daily as needed.    . diltiazem (CARDIZEM CD) 360 MG 24 hr capsule TAKE 1 CAPSULE BY MOUTH EVERY DAY 90 capsule 1  . docusate sodium (COLACE) 100 MG capsule Take 1 capsule (100 mg total) by mouth every 12 (twelve) hours. 60 capsule 0  . dorzolamide-timolol (COSOPT) 22.3-6.8 MG/ML ophthalmic solution 1 drop 2 (two) times daily.    . DUREZOL 0.05 % EMUL Place 1 drop into the right eye 3 (three) times daily.    . fish oil-omega-3 fatty acids 1000 MG capsule Take 1 capsule by mouth daily.     . furosemide (LASIX) 40 MG tablet Take 1 tablet (40 mg total) by mouth daily as needed for edema. 90 tablet 3  . guanFACINE (TENEX) 2 MG tablet Take 1 tablet (2 mg total) by mouth at bedtime. 90 tablet 3  . hydrALAZINE (APRESOLINE) 25 MG tablet Take 25 mg by mouth 3 (three) times daily.    . influenza vaccine (FLUCELVAX QUADRIVALENT) 0.5 ML injection Flucelvax Quad 2019-2020 (PF) 60 mcg (15 mcg x 4)/0.5 mL IM syringe  TO BE ADMINISTERED BY PHARMACIST FOR IMMUNIZATION    . irbesartan (AVAPRO) 300 MG tablet TAKE 1 TABLET BY MOUTH EVERY DAY 90 tablet 1  . latanoprost (XALATAN) 0.005 % ophthalmic solution Place 1 drop into both eyes at bedtime.  4  . levothyroxine (SYNTHROID) 75 MCG tablet TAKE 1 TABLET BY MOUTH EVERY DAY BEFORE BREAKFAST 90 tablet 3  . Multiple Vitamin (MULTIVITAMIN) capsule Take 1 capsule by mouth daily.      . polyethylene glycol (MIRALAX / GLYCOLAX) packet Take 17 g by mouth daily. 14 each 0  . potassium chloride (KLOR-CON M10) 10 MEQ tablet Take 1 tablet (10 mEq total) by mouth daily. 90 tablet 3  . Probiotic Product (PHILLIPS COLON HEALTH PO) Take by mouth.    . traMADol (ULTRAM-ER) 200 MG 24 hr tablet  Take 1 tablet (200 mg total) by mouth daily. 30 tablet 5   No current facility-administered medications on file prior to visit.   Review of Systems All otherwise neg per pt    Objective:   Physical Exam BP (!) 170/98 (BP Location: Left Arm, Patient Position: Sitting, Cuff Size: Normal)   Pulse (!) 59   Temp 98.4 F (36.9  C) (Oral)   Ht 5\' 8"  (1.727 m)   Wt 160 lb 12.8 oz (72.9 kg)   SpO2 99%   BMI 24.45 kg/m  VS noted,  Constitutional: Pt appears in NAD HENT: Head: NCAT.  Right Ear: External ear normal.  Left Ear: External ear normal.  Eyes: . Pupils are equal, round, and reactive to light. Conjunctivae and EOM are normal Nose: without d/c or deformity Neck: Neck supple. Gross normal ROM Cardiovascular: Normal rate and regular rhythm.   Pulmonary/Chest: Effort normal and breath sounds without rales or wheezing.  Abd:  Soft, NT, ND, + BS, no organomegaly Neurological: Pt is alert. At baseline orientation, motor grossly intact Skin: Skin is warm. No rashes, other new lesions, trace to 1+ bilat lymph LE edema Psychiatric: Pt behavior is normal without agitation  All otherwise neg per pt Lab Results  Component Value Date   WBC 6.4 10/08/2019   HGB 13.1 10/08/2019   HCT 37.7 10/08/2019   PLT 243.0 10/08/2019   GLUCOSE 138 (H) 10/08/2019   CHOL 171 10/08/2019   TRIG 57.0 10/08/2019   HDL 86.80 10/08/2019   LDLDIRECT 131.2 10/27/2008   LDLCALC 73 10/08/2019   ALT 11 10/08/2019   AST 14 10/08/2019   NA 141 10/08/2019   K 4.4 10/08/2019   CL 105 10/08/2019   CREATININE 0.98 10/08/2019   BUN 14 10/08/2019   CO2 30 10/08/2019   TSH 3.64 10/08/2019   HGBA1C 5.8 10/08/2019   MICROALBUR 2.7 (H) 10/08/2019      Assessment & Plan:

## 2020-02-08 NOTE — Assessment & Plan Note (Signed)
Also consider f/u renal u/s in 6 -12 mo

## 2020-02-08 NOTE — Assessment & Plan Note (Addendum)
Pt now scheduled for irght knee revision surgury dec 22 (or sooner if moved up), pt request preop cardiology consult, ecg today reviewed

## 2020-02-08 NOTE — Assessment & Plan Note (Signed)
For repeat ua today, asympt,  to f/u any worsening symptoms or concerns

## 2020-02-09 ENCOUNTER — Encounter: Payer: Self-pay | Admitting: Internal Medicine

## 2020-02-09 LAB — URINALYSIS, ROUTINE W REFLEX MICROSCOPIC
Bilirubin Urine: NEGATIVE
Hgb urine dipstick: NEGATIVE
Ketones, ur: NEGATIVE
Leukocytes,Ua: NEGATIVE
Nitrite: NEGATIVE
Specific Gravity, Urine: 1.025 (ref 1.000–1.030)
Total Protein, Urine: NEGATIVE
Urine Glucose: NEGATIVE
Urobilinogen, UA: 0.2 (ref 0.0–1.0)
pH: 6.5 (ref 5.0–8.0)

## 2020-02-12 ENCOUNTER — Encounter: Payer: Self-pay | Admitting: Internal Medicine

## 2020-02-24 ENCOUNTER — Other Ambulatory Visit: Payer: BC Managed Care – PPO

## 2020-02-25 ENCOUNTER — Ambulatory Visit
Admission: RE | Admit: 2020-02-25 | Discharge: 2020-02-25 | Disposition: A | Discharge status: Home / Self Care | Payer: BC Managed Care – PPO | Source: Ambulatory Visit | Attending: Internal Medicine | Admitting: Internal Medicine

## 2020-02-25 DIAGNOSIS — I10 Essential (primary) hypertension: Secondary | ICD-10-CM

## 2020-02-26 ENCOUNTER — Encounter: Payer: Self-pay | Admitting: Internal Medicine

## 2020-03-01 ENCOUNTER — Ambulatory Visit: Payer: BC Managed Care – PPO | Admitting: Internal Medicine

## 2020-03-01 ENCOUNTER — Other Ambulatory Visit: Payer: Self-pay

## 2020-03-01 ENCOUNTER — Encounter: Payer: Self-pay | Admitting: Internal Medicine

## 2020-03-01 VITALS — BP 144/80 | HR 99 | Ht 68.0 in | Wt 164.0 lb

## 2020-03-01 DIAGNOSIS — I10 Essential (primary) hypertension: Secondary | ICD-10-CM

## 2020-03-01 DIAGNOSIS — Z01810 Encounter for preprocedural cardiovascular examination: Secondary | ICD-10-CM

## 2020-03-01 DIAGNOSIS — E119 Type 2 diabetes mellitus without complications: Secondary | ICD-10-CM | POA: Diagnosis not present

## 2020-03-01 DIAGNOSIS — E785 Hyperlipidemia, unspecified: Secondary | ICD-10-CM

## 2020-03-01 NOTE — Progress Notes (Signed)
Cardiology Office Note:    Date:  03/01/2020   ID:  Threasa Beards, DOB 1956-04-06, MRN [id number]  PCP:  Corwin Levins, MD  [facility] Cardiologist:  No primary care provider on file.  [facility] Electrophysiologist:  None   CC: pre-op eval with HTN Consulted for the evaluation of preoperative risk stratification at the behest of Corwin Levins, MD R Knee Surgery Revision 04/13/20 Ollen Gross MD  History of Present Illness:    Penny Yates is a 64 y.o. female with a hx of Diabetes with HTN, HLD, hypothyroidism, seen for preoperative evaluation.  Patient has had no issues with prior knee surgery 14 years prior.  Since then has had a gate disturbance and knee pain.  Has been able to wean her pain medications, but because she has had knee pain is planned for revision.  Still is able to walk; able to walk up to her office on the third floor, no chest pain or chest pressure.  No PND or orthopnea.  No syncope or near syncope.  Notes that her blood pressur is going down and occasional misses her lunch time hydralazine.  See a kidney doctor because her sister has kidney problems and was told that her a small cyst on her kidney.  Patient originally had HTN issues every since (age 33).    Ambulatory blood pressure is 138/95  Past Medical History:  Diagnosis Date  . Anemia   . Chronic venous insufficiency    LE's  . Depression    pt denies  . Diabetes mellitus without complication (HCC)   . Fracture of shaft of right radius 01/06/2013  . History of colonic polyps    multiple of succeeding years 96, 97, 98, and 99  . Hyperlipidemia   . Hypertension   . Hyperthyroidism    s/p rad Iodine-ellison  . Hypothyroidism 01/19/2015  . Leg length discrepancy    right leg longer  . Low back pain   . Lymphedema    left arm, both legs; primary lymphedema  . Sacroiliitis (HCC)    history of  . Sciatica     Past Surgical History:  Procedure Laterality Date  . [place]  [place]   . EYE SURGERY     both eyes-muscle repair  . OPEN REDUCTION INTERNAL FIXATION (ORIF) DISTAL RADIAL FRACTURE Right 01/06/2013   Procedure: RIGHT OPEN REDUCTION INTERNAL FIXATION (ORIF) DISTAL ARTICULATING RADIAL FRACTURE ;  Surgeon: Eulas Post, MD;  Location: [facility];  Service: Orthopedics;  Laterality: Right;  . 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 REVISION OF SCAR TISSUE RECTUS MUSCLE  11/08   right knee  . TUBAL LIGATION      Current Medications: Current Meds  Medication Sig  . alendronate (FOSAMAX) 70 MG tablet Take 1 tablet (70 mg total) by mouth every 7 (seven) days. Take with a full glass of water on an empty stomach.  . ALPHAGAN P 0.1 % SOLN INSTILL 1 DROP INTO BOTH EYES TWICE A DAY  . amoxicillin (AMOXIL) 500 MG capsule Take 1,000 mg by mouth See admin instructions. Take 2 capsules by mouth 1 hr prior to dental procedure, then 2 capsules 4 hrs after  . atorvastatin (LIPITOR) 10 MG tablet Take 1 tablet (10 mg total) by mouth daily.  . Biotin 5000 MCG CAPS Take 5,000 mcg by mouth daily.   . diclofenac Sodium (VOLTAREN) 1 % GEL Apply 2 g topically 4 (four) times daily  as needed.  . diltiazem (CARDIZEM CD) 360 MG 24 hr capsule TAKE 1 CAPSULE BY MOUTH EVERY DAY  . dorzolamide-timolol (COSOPT) 22.3-6.8 MG/ML ophthalmic solution 1 drop 2 (two) times daily.  . DUREZOL 0.05 % EMUL Place 1 drop into the right eye daily.   . fish oil-omega-3 fatty acids 1000 MG capsule Take 1 capsule by mouth daily.   . furosemide (LASIX) 40 MG tablet Take 1 tablet (40 mg total) by mouth daily as needed for edema.  Marland Kitchen. guanFACINE (TENEX) 2 MG tablet Take 1 tablet (2 mg total) by mouth at bedtime.  . hydrALAZINE (APRESOLINE) 50 MG tablet Take 1 tablet (50 mg total) by mouth 3 (three) times daily.  . irbesartan (AVAPRO) 300 MG tablet TAKE 1 TABLET BY MOUTH EVERY DAY  . latanoprost (XALATAN) 0.005 % ophthalmic solution Place 1 drop into both eyes at bedtime.  Marland Kitchen. levothyroxine  (SYNTHROID) 75 MCG tablet TAKE 1 TABLET BY MOUTH EVERY DAY BEFORE BREAKFAST  . Multiple Vitamin (MULTIVITAMIN) capsule Take 1 capsule by mouth daily.    . potassium chloride (KLOR-CON M10) 10 MEQ tablet Take 1 tablet (10 mEq total) by mouth daily.  . Probiotic Product (PHILLIPS COLON HEALTH PO) Take by mouth.  . traMADol (ULTRAM-ER) 200 MG 24 hr tablet Take 1 tablet (200 mg total) by mouth daily.    Allergies:   Ace inhibitors and Lisinopril   Social History   Socioeconomic History  . Marital status: Married    Spouse name: Not on file  . Number of children: Not on file  . Years of education: Not on file  . Highest education level: Not on file  Occupational History  . Not on file  Tobacco Use  . Smoking status: Never Smoker  . Smokeless tobacco: Never Used  Vaping Use  . Vaping Use: Never used  Substance and Sexual Activity  . Alcohol use: No  . Drug use: No  . Sexual activity: Not on file  Other Topics Concern  . Not on file  Social History Narrative   Moved to [place] in Jan 2009. Married with children. 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 Looking for a new job.   Social Determinants of Health   Financial Resource Strain:   . Difficulty of Paying Living Expenses: Not on file  Food Insecurity:   . Worried About Programme researcher, broadcasting/film/videounning Out of Food in the Last Year: Not on file  . Ran Out of Food in the Last Year: Not on file  Transportation Needs:   . Lack of Transportation (Medical): Not on file  . Lack of Transportation (Non-Medical): Not on file  Physical Activity:   . Days of Exercise per Week: Not on file  . Minutes of Exercise per Session: Not on file  Stress:   . Feeling of Stress : Not on file  Social Connections:   . Frequency of Communication with Friends and Family: Not on file  . Frequency of Social Gatherings with Friends and Family: Not on file  . Attends Religious Services: Not on file  . Active  Member of Clubs or Organizations: Not on file  . Attends BankerClub or Organization Meetings: Not on file  . Marital Status: Not on file   Family History: The patient's family history includes Cancer in her mother; Colon cancer (age of onset: 7454) in her mother; Diabetes in her mother and unknown relative; Heart disease in her maternal grandmother; Kidney disease  in her unknown relative; Stroke in her unknown relative. There is no history of Thyroid disease, Esophageal cancer, Rectal cancer, or Stomach cancer. Sister may have PCKD.  ROS:   Please see the history of present illness.    All other systems reviewed and are negative.  EKGs/Labs/Other Studies Reviewed:    The following studies were reviewed today:  EKG:   02/08/20 OSH EKG- Sinus Brady rate 54 no ST/T changes  Recent Labs: 10/08/2019: ALT 11; BUN 14; Creatinine, Ser 0.98; Hemoglobin 13.1; Platelets 243.0; Potassium 4.4; Sodium 141; TSH 3.64  Recent Lipid Panel    Component Value Date/Time   CHOL 171 10/08/2019 0926   TRIG 57.0 10/08/2019 0926   HDL 86.80 10/08/2019 0926   CHOLHDL 2 10/08/2019 0926   VLDL 11.4 10/08/2019 0926   LDLCALC 73 10/08/2019 0926   LDLDIRECT 131.2 10/27/2008 1430   2012 Echo report only Left ventricle: The cavity size was normal. Wall thickness was  increased in a pattern of mild LVH. Systolic function was normal.  The estimated ejection fraction was in the range of 55% to 65%. Wall  motion was normal; there were no regional wall motion abnormalities.  Doppler parameters are consistent with abnormal left ventricular  relaxation (grade 1 diastolic dysfunction).   Physical Exam:    VS:  BP (!) 144/80   Pulse 99   Ht 5\' 8"  (1.727 m)   Wt 164 lb (74.4 kg)   SpO2 (!) 84%   BMI 24.94 kg/m     Wt Readings from Last 3 Encounters:  03/01/20 164 lb (74.4 kg)  02/08/20 160 lb 12.8 oz (72.9 kg)  10/15/19 160 lb (72.6 kg)     GEN: Well nourished, well developed in no acute distress HEENT:  Normal NECK: No JVD; No carotid bruits LYMPHATICS: No lymphadenopathy CARDIAC: RRR, no murmurs, rubs, gallops RESPIRATORY:  Clear to auscultation without rales, wheezing or rhonchi  ABDOMEN: Soft, non-tender, non-distended MUSCULOSKELETAL: right knee swelling; slight non pitting edema right leg; No deformity  SKIN: Warm and dry NEUROLOGIC:  Alert and oriented x 3 PSYCHIATRIC:  Normal affect   ASSESSMENT:    1. Preoperative cardiovascular examination   2. Diabetes mellitus with coincident hypertension (HCC)   3. Hyperlipidemia, unspecified hyperlipidemia type    PLAN:    In order of problems listed above:  Preoperative Risk Assessment - The Revised Cardiac Risk Index = 0 which equates to 0=0.4%: very low estimated risk of perioperative myocardial infarction, pulmonary edema, ventricular fibrillation, cardiac arrest, or complete heart block.  - No DASI 25; 5.81 fMETS - No further cardiac testing is recommended prior to surgery.  - The patient may proceed to surgery at acceptable risk.   - Our service is available as needed in the peri-operative period.    Diabetes with Hypertension - ambulatory blood pressure , will start/continue ambulatory BP monitoring (gave education) - continue home medications (dilt 360, lasix 40 mg, hydralazine 50 TID, Irbesartan 300)   6 month follow up unless new symptoms or abnormal test results warranting change in plan  Shared Decision Making/Informed Consent      Medication Adjustments/Labs and Tests Ordered: Current medicines are reviewed at length with the patient today.  Concerns regarding medicines are outlined above.  No orders of the defined types were placed in this encounter.  No orders of the defined types were placed in this encounter.   Patient Instructions  Medication Instructions:  Your physician recommends that you continue on your current medications as directed. Please  refer to the Current Medication list given to you  today.  *If you need a refill on your cardiac medications before your next appointment, please call your pharmacy*   Lab Work: None  If you have labs (blood work) drawn today and your tests are completely normal, you will receive your results only by: Marland Kitchen MyChart Message (if you have MyChart) OR . A paper copy in the mail If you have any lab test that is abnormal or we need to change your treatment, we will call you to review the results.   Testing/Procedures: None   Follow-Up: At [facility], you and your health needs are our priority.  As part of our continuing mission to provide you with exceptional heart care, we have created designated Provider Care Teams.  These Care Teams include your primary Cardiologist (physician) and Advanced Practice Providers (APPs -  Physician Assistants and Nurse Practitioners) who all work together to provide you with the care you need, when you need it.  We recommend signing up for the patient portal called "MyChart".  Sign up information is provided on this After Visit Summary.  MyChart is used to connect with patients for Virtual Visits (Telemedicine).  Patients are able to view lab/test results, encounter notes, upcoming appointments, etc.  Non-urgent messages can be sent to your provider as well.   To learn more about what you can do with MyChart, go to [email or web address].    Your next appointment:   6 month(s)  The format for your next appointment:   In Person  Provider:   Riley Lam, MD   Other Instructions None     Signed, Christell Constant, MD  03/01/2020 3:14 PM    [facility]

## 2020-03-01 NOTE — Patient Instructions (Signed)
Medication Instructions:  Your physician recommends that you continue on your current medications as directed. Please refer to the Current Medication list given to you today.  *If you need a refill on your cardiac medications before your next appointment, please call your pharmacy*   Lab Work: None  If you have labs (blood work) drawn today and your tests are completely normal, you will receive your results only by: Marland Kitchen MyChart Message (if you have MyChart) OR . A paper copy in the mail If you have any lab test that is abnormal or we need to change your treatment, we will call you to review the results.   Testing/Procedures: None   Follow-Up: At [facility], you and your health needs are our priority.  As part of our continuing mission to provide you with exceptional heart care, we have created designated Provider Care Teams.  These Care Teams include your primary Cardiologist (physician) and Advanced Practice Providers (APPs -  Physician Assistants and Nurse Practitioners) who all work together to provide you with the care you need, when you need it.  We recommend signing up for the patient portal called "MyChart".  Sign up information is provided on this After Visit Summary.  MyChart is used to connect with patients for Virtual Visits (Telemedicine).  Patients are able to view lab/test results, encounter notes, upcoming appointments, etc.  Non-urgent messages can be sent to your provider as well.   To learn more about what you can do with MyChart, go to [email or web address].    Your next appointment:   6 month(s)  The format for your next appointment:   In Person  Provider:   Riley Lam, MD   Other Instructions None

## 2020-03-30 ENCOUNTER — Encounter (HOSPITAL_COMMUNITY): Payer: Self-pay

## 2020-03-30 NOTE — Patient Instructions (Addendum)
DUE TO COVID-19 ONLY ONE VISITOR IS ALLOWED TO COME WITH YOU AND STAY IN THE WAITING ROOM ONLY DURING PRE OP AND PROCEDURE DAY OF SURGERY. THE 1 VISITOR  MAY VISIT WITH YOU AFTER SURGERY IN YOUR PRIVATE ROOM DURING VISITING HOURS ONLY!  YOU NEED TO HAVE A COVID 19 TEST ON__12-18_____ @_12 :00______, THIS TEST MUST BE DONE BEFORE SURGERY,  COVID TESTING SITE [address] [place] , IT IS ON THE RIGHT GOING OUT [address] APPROXIMATELY  2 MINUTES PAST [employer] ON THE RIGHT. ONCE YOUR COVID TEST IS COMPLETED,  PLEASE BEGIN THE QUARANTINE INSTRUCTIONS AS OUTLINED IN YOUR HANDOUT.                Penny Yates  03/30/2020   Your procedure is scheduled on: 04-13-20   Report to [facility] Main  Entrance   Report to admitting at      1030  AM     Call this number if you have problems the morning of surgery [phone]    Remember: NO SOLID FOOD AFTER MIDNIGHT THE NIGHT PRIOR TO SURGERY. NOTHING BY MOUTH EXCEPT CLEAR LIQUIDS UNTIL   10:00 am  . PLEASE FINISH ENSURE DRINK PER SURGEON ORDER  WHICH NEEDS TO BE COMPLETED AT         10:00 am then nothing by mouth.      CLEAR LIQUID DIET   Foods Allowed                                                                      Coffee and tea, regular and decaf                            Plain Jell-O any favor except red or purple                                            Fruit ices (not with fruit pulp)                                    Iced Popsicles                                    Carbonated beverages, regular and diet                                    Cranberry, grape and apple juices Sports drinks like Gatorade Lightly seasoned clear broth or consume(fat free) Sugar, honey syrup_   BRUSH YOUR TEETH MORNING OF SURGERY AND RINSE YOUR MOUTH OUT, NO CHEWING GUM CANDY OR MINTS.     Take these medicines the morning of surgery with A SIP OF WATER: levothyroxine, hydralazine, eye drops as usual,  diltiazem, lipitor  DO NOT TAKE ANY DIABETIC MEDICATIONS DAY OF YOUR SURGERY  You may not have any metal on your body including hair pins and              piercings  Do not wear jewelry, make-up, lotions, powders or perfumes, deodorant             Do not wear nail polish on your fingernails.  Do not shave  48 hours prior to surgery.               Do not bring valuables to the hospital. [facility] IS NOT             RESPONSIBLE   FOR VALUABLES.  Contacts, dentures or bridgework may not be worn into surgery.      Patients discharged the day of surgery will not be allowed to drive home. IF YOU ARE HAVING SURGERY AND GOING HOME THE SAME DAY, YOU MUST HAVE AN ADULT TO DRIVE YOU HOME AND BE WITH YOU FOR 24 HOURS. YOU MAY GO HOME BY TAXI OR UBER OR ORTHERWISE, BUT AN ADULT MUST ACCOMPANY YOU HOME AND STAY WITH YOU FOR 24 HOURS.  Name and phone number of your driver:  Special Instructions: N/A              Please read over the following fact sheets you were given: _____________________________________________________________________             [facility] - Preparing for Surgery Before surgery, you can play an important role.  Because skin is not sterile, your skin needs to be as free of germs as possible.  You can reduce the number of germs on your skin by washing with CHG (chlorahexidine gluconate) soap before surgery.  CHG is an antiseptic cleaner which kills germs and bonds with the skin to continue killing germs even after washing. Please DO NOT use if you have an allergy to CHG or antibacterial soaps.  If your skin becomes reddened/irritated stop using the CHG and inform your nurse when you arrive at Short Stay. Do not shave (including legs and underarms) for at least 48 hours prior to the first CHG shower.  You may shave your face/neck. Please follow these instructions carefully:  1.  Shower with CHG Soap the night before surgery and the  morning of  Surgery.  2.  If you choose to wash your hair, wash your hair first as usual with your  normal  shampoo.  3.  After you shampoo, rinse your hair and body thoroughly to remove the  shampoo.                           4.  Use CHG as you would any other liquid soap.  You can apply chg directly  to the skin and wash                       Gently with a scrungie or clean washcloth.  5.  Apply the CHG Soap to your body ONLY FROM THE NECK DOWN.   Do not use on face/ open                           Wound or open sores. Avoid contact with eyes, ears mouth and genitals (private parts).                       Wash face,  Genitals (private parts) with  your normal soap.             6.  Wash thoroughly, paying special attention to the area where your surgery  will be performed.  7.  Thoroughly rinse your body with warm water from the neck down.  8.  DO NOT shower/wash with your normal soap after using and rinsing off  the CHG Soap.                9.  Pat yourself dry with a clean towel.            10.  Wear clean pajamas.            11.  Place clean sheets on your bed the night of your first shower and do not  sleep with pets. Day of Surgery : Do not apply any lotions/deodorants the morning of surgery.  Please wear clean clothes to the hospital/surgery center.  FAILURE TO FOLLOW THESE INSTRUCTIONS MAY RESULT IN THE CANCELLATION OF YOUR SURGERY PATIENT SIGNATURE_________________________________  NURSE SIGNATURE__________________________________  ________________________________________________________________________   Penny Yates  An incentive spirometer is a tool that can help keep your lungs clear and active. This tool measures how well you are filling your lungs with each breath. Taking long deep breaths may help reverse or decrease the chance of developing breathing (pulmonary) problems (especially infection) following:  A long period of time when you are unable to move or be active. BEFORE  THE PROCEDURE   If the spirometer includes an indicator to show your best effort, your nurse or respiratory therapist will set it to a desired goal.  If possible, sit up straight or lean slightly forward. Try not to slouch.  Hold the incentive spirometer in an upright position. INSTRUCTIONS FOR USE  1. Sit on the edge of your bed if possible, or sit up as far as you can in bed or on a chair. 2. Hold the incentive spirometer in an upright position. 3. Breathe out normally. 4. Place the mouthpiece in your mouth and seal your lips tightly around it. 5. Breathe in slowly and as deeply as possible, raising the piston or the ball toward the top of the column. 6. Hold your breath for 3-5 seconds or for as long as possible. Allow the piston or ball to fall to the bottom of the column. 7. Remove the mouthpiece from your mouth and breathe out normally. 8. Rest for a few seconds and repeat Steps 1 through 7 at least 10 times every 1-2 hours when you are awake. Take your time and take a few normal breaths between deep breaths. 9. The spirometer may include an indicator to show your best effort. Use the indicator as a goal to work toward during each repetition. 10. After each set of 10 deep breaths, practice coughing to be sure your lungs are clear. If you have an incision (the cut made at the time of surgery), support your incision when coughing by placing a pillow or rolled up towels firmly against it. Once you are able to get out of bed, walk around indoors and cough well. You may stop using the incentive spirometer when instructed by your caregiver.  RISKS AND COMPLICATIONS  Take your time so you do not get dizzy or light-headed.  If you are in pain, you may need to take or ask for pain medication before doing incentive spirometry. It is harder to take a deep breath if you are having pain. AFTER USE  Rest and breathe slowly  and easily.  It can be helpful to keep track of a log of your progress.  Your caregiver can provide you with a simple table to help with this. If you are using the spirometer at home, follow these instructions: SEEK MEDICAL CARE IF:   You are having difficultly using the spirometer.  You have trouble using the spirometer as often as instructed.  Your pain medication is not giving enough relief while using the spirometer.  You develop fever of 100.5 F (38.1 C) or higher. SEEK IMMEDIATE MEDICAL CARE IF:   You cough up bloody sputum that had not been present before.  You develop fever of 102 F (38.9 C) or greater.  You develop worsening pain at or near the incision site. MAKE SURE YOU:   Understand these instructions.  Will watch your condition.  Will get help right away if you are not doing well or get worse. Document Released: 08/20/2006 Document Revised: 07/02/2011 Document Reviewed: 10/21/2006 [facility] Patient Information 2014 [place] [place].   ________________________________________________________________________

## 2020-03-30 NOTE — Progress Notes (Addendum)
PCP - Oliver Barre, MD clearance 02-09-20 chart Cardiologist -clearance 03-01-20 epic Riley Lam, MD   PPM/ICD -  Device Orders -  Rep Notified -   Chest x-ray -  EKG - 02-08-20 epic Stress Test -  ECHO -  Cardiac Cath -   Sleep Study -  CPAP -   Fasting Blood Sugar -  Checks Blood Sugar ___0__ times a day  Blood Thinner Instructions: Aspirin Instructions:  ERAS Protcol - PRE-SURGERY G2-   COVID TEST- 04-09-20 Fully vaccinated Pfizer booster 01-21-20 Activity-- Walks almost everyday with dog without sob Anesthesia review: HTN, DM no meds   Patient denies shortness of breath, fever, cough and chest pain at PAT appointment   NONE   All instructions explained to the patient, with a verbal understanding of the material. Patient agrees to go over the instructions while at home for a better understanding. Patient also instructed to self quarantine after being tested for COVID-19. The opportunity to ask questions was provided.

## 2020-04-06 ENCOUNTER — Encounter (HOSPITAL_COMMUNITY): Payer: Self-pay

## 2020-04-06 ENCOUNTER — Other Ambulatory Visit: Payer: Self-pay

## 2020-04-06 ENCOUNTER — Encounter (HOSPITAL_COMMUNITY)
Admission: RE | Admit: 2020-04-06 | Discharge: 2020-04-06 | Disposition: A | Discharge status: Home / Self Care | Payer: BC Managed Care – PPO | Source: Ambulatory Visit | Attending: Orthopedic Surgery | Admitting: Orthopedic Surgery

## 2020-04-06 DIAGNOSIS — Z01812 Encounter for preprocedural laboratory examination: Secondary | ICD-10-CM | POA: Insufficient documentation

## 2020-04-06 HISTORY — DX: Myoneural disorder, unspecified: G70.9

## 2020-04-06 HISTORY — DX: Age-related osteoporosis without current pathological fracture: M81.0

## 2020-04-06 LAB — COMPREHENSIVE METABOLIC PANEL
ALT: 18 U/L (ref 0–44)
AST: 19 U/L (ref 15–41)
Albumin: 3.8 g/dL (ref 3.5–5.0)
Alkaline Phosphatase: 73 U/L (ref 38–126)
Anion gap: 8 (ref 5–15)
BUN: 17 mg/dL (ref 8–23)
CO2: 29 mmol/L (ref 22–32)
Calcium: 9.7 mg/dL (ref 8.9–10.3)
Chloride: 104 mmol/L (ref 98–111)
Creatinine, Ser: 0.71 mg/dL (ref 0.44–1.00)
GFR, Estimated: >60 mL/min (ref >60)
Glucose, Bld: 113 mg/dL — ABNORMAL HIGH (ref 70–99)
Potassium: 4.6 mmol/L (ref 3.5–5.1)
Sodium: 141 mmol/L (ref 135–145)
Total Bilirubin: 0.4 mg/dL (ref 0.3–1.2)
Total Protein: 7.1 g/dL (ref 6.5–8.1)

## 2020-04-06 LAB — CBC
HCT: 36.4 % (ref 36.0–46.0)
Hemoglobin: 13 g/dL (ref 12.0–15.0)
MCH: 29.5 pg (ref 26.0–34.0)
MCHC: 35.7 g/dL (ref 30.0–36.0)
MCV: 82.5 fL (ref 80.0–100.0)
Platelets: 227 10*3/uL (ref 150–400)
RBC: 4.41 MIL/uL (ref 3.87–5.11)
RDW: 14.8 % (ref 11.5–15.5)
WBC: 7.1 10*3/uL (ref 4.0–10.5)
nRBC: 0 % (ref 0.0–0.2)

## 2020-04-06 LAB — SURGICAL PCR SCREEN
MRSA, PCR: NEGATIVE
Staphylococcus aureus: NEGATIVE

## 2020-04-06 LAB — PROTIME-INR
INR: 1 (ref 0.8–1.2)
Prothrombin Time: 13.1 seconds (ref 11.4–15.2)

## 2020-04-06 LAB — HEMOGLOBIN A1C
Hgb A1c MFr Bld: 5.9 % — ABNORMAL HIGH (ref 4.8–5.6)
Mean Plasma Glucose: 122.63 mg/dL

## 2020-04-06 LAB — GLUCOSE, CAPILLARY
Glucose-Capillary: 64 mg/dL — ABNORMAL LOW (ref 70–99)
Glucose-Capillary: 106 mg/dL — ABNORMAL HIGH (ref 70–99)

## 2020-04-06 LAB — APTT: aPTT: 28 seconds (ref 24–36)

## 2020-04-06 NOTE — H&P (Signed)
TOTAL KNEE REVISION ADMISSION H&P  Patient is being admitted for right knee polyethylene exchange vs. revision total knee arthroplasty.  Subjective:  Chief Complaint:right knee pain.  HPI: Penny QUIETT, 64 y.o. female, has a history of pain and functional disability in the right knee(s) due to failed previous arthroplasty and patient has failed non-surgical conservative treatments for greater than 12 weeks to include NSAID's and/or analgesics, use of assistive devices and activity modification. She had a prior right total knee in 2007 by Rosary Lively, [place] in [place]. She underwent arthroscopy with synovectomy due to patellar clunk within six months of the original surgery. She saw Dr. Lequita Halt, and was prescribed an economy hinge brace for instability, which she was not able to tolerate due to lymphedema. Bone Scan was ordered, which demonstrated increased uptake just under the tibial tray, medial and lateral, but it does not extend to the stem portion. There was no evidence of increased uptake around the femur. No evidence of infection.  There is no current active infection.  Patient Active Problem List   Diagnosis Date Noted  . Preoperative cardiovascular examination 03/01/2020  . Diabetes mellitus with coincident hypertension (HCC) 03/01/2020  . Abnormal urinalysis 02/08/2020  . Renal cyst 02/08/2020  . Hyperkalemia 06/26/2016  . Acquired lymphedema of leg 06/26/2016  . Leg length discrepancy 07/13/2015  . 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 Headache 06/28/2015  . Hypothyroidism 01/19/2015  . Right flank pain 01/14/2015  . Fracture of shaft of right radius 01/06/2013  . Palpitations 02/12/2012  . Hyperglycemia 01/08/2011  . Preventative health care 01/05/2011  . Hypopotassemia 10/18/2010  . Edema 07/26/2010  . SYNCOPE, HX OF 07/03/2010  . Osteoporosis 11/01/2009  . Bilateral hearing loss 06/17/2009  . DEPRESSION 01/07/2009  . FATIGUE 01/07/2009  .  OBSTRUCTIVE SLEEP APNEA 11/17/2008  . HYPERSOMNIA 10/27/2008  . DIVERTICULOSIS, COLON 06/04/2008  . KNEE PAIN, RIGHT 01/27/2008  . BACK PAIN 01/27/2008  . GOITER, MULTINODULAR 10/08/2007  . HYPERTHYROIDISM 10/08/2007  . MENOPAUSAL SYNDROME 10/08/2007  . Hyperlipidemia 09/19/2007  . ANEMIA-IRON DEFICIENCY 09/19/2007  . Essential hypertension 09/19/2007  . LOW BACK PAIN 09/19/2007  . WEIGHT LOSS 09/19/2007  . COLONIC POLYPS, HX OF 09/19/2007   Past Medical History:  Diagnosis Date  . Anemia   . Chronic venous insufficiency    LE's  . Depression    pt denies  . Diabetes mellitus without complication (HCC)   . Fracture of shaft of right radius 01/06/2013  . History of colonic polyps    multiple of succeeding years 96, 97, 98, and 99  . Hyperlipidemia   . Hypertension   . Hyperthyroidism    s/p rad Iodine-ellison  . Hypothyroidism 01/19/2015  . Leg length discrepancy    right leg longer  . Low back pain   . Lymphedema    left arm, both legs; primary lymphedema  . Sacroiliitis (HCC)    history of  . Sciatica     Past Surgical History:  Procedure Laterality Date  . [place]OS[place]  [place]  . EYE SURGERY     both eyes-muscle repair  . OPEN REDUCTION INTERNAL FIXATION (ORIF) DISTAL RADIAL FRACTURE Right 01/06/2013   Procedure: RIGHT OPEN REDUCTION INTERNAL FIXATION (ORIF) DISTAL ARTICULATING RADIAL FRACTURE ;  Surgeon: Eulas Post, MD;  Location: [facility];  Service: Orthopedics;  Laterality: Right;  . 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 REVISION OF SCAR TISSUE RECTUS MUSCLE  11/08  right knee  . TUBAL LIGATION      No current facility-administered medications for this encounter.   Current Outpatient Medications  Medication Sig Dispense Refill Last Dose  . alendronate (FOSAMAX) 70 MG tablet Take 1 tablet (70 mg total) by mouth every 7 (seven) days. Take with a full glass of water on an empty stomach. 12 tablet 3   . ALPHAGAN P 0.1 % SOLN  Place 1 drop into both eyes in the morning and at bedtime.   11   . amoxicillin (AMOXIL) 500 MG capsule Take 1,000 mg by mouth See admin instructions. Take 2 capsules by mouth 1 hr prior to dental procedure, then 2 capsules 4 hrs after  1   . ascorbic acid (VITAMIN C) 500 MG tablet Take 500 mg by mouth daily.     Marland Kitchen atorvastatin (LIPITOR) 10 MG tablet Take 1 tablet (10 mg total) by mouth daily. 90 tablet 3   . Biotin 5000 MCG CAPS Take 5,000 mcg by mouth daily.      . cholecalciferol (VITAMIN D3) 25 MCG (1000 UNIT) tablet Take 1,000 Units by mouth daily.     . diclofenac Sodium (VOLTAREN) 1 % GEL Apply 2 g topically 4 (four) times daily as needed (pain).      Marland Kitchen diltiazem (CARDIZEM CD) 360 MG 24 hr capsule TAKE 1 CAPSULE BY MOUTH EVERY DAY (Patient taking differently: Take 360 mg by mouth daily. TAKE 1 CAPSULE BY MOUTH EVERY DAY) 90 capsule 1   . dorzolamide-timolol (COSOPT) 22.3-6.8 MG/ML ophthalmic solution Place 1 drop into both eyes 2 (two) times daily.      . DUREZOL 0.05 % EMUL Place 1 drop into the right eye every other day.      . fish oil-omega-3 fatty acids 1000 MG capsule Take 1 g by mouth daily.      . furosemide (LASIX) 40 MG tablet Take 1 tablet (40 mg total) by mouth daily as needed for edema. 90 tablet 3   . guanFACINE (TENEX) 2 MG tablet Take 1 tablet (2 mg total) by mouth at bedtime. 90 tablet 3   . hydrALAZINE (APRESOLINE) 50 MG tablet Take 1 tablet (50 mg total) by mouth 3 (three) times daily. 90 tablet 11   . irbesartan (AVAPRO) 300 MG tablet TAKE 1 TABLET BY MOUTH EVERY DAY (Patient taking differently: Take 300 mg by mouth daily. ) 90 tablet 1   . latanoprost (XALATAN) 0.005 % ophthalmic solution Place 1 drop into both eyes at bedtime.  4   . levothyroxine (SYNTHROID) 75 MCG tablet TAKE 1 TABLET BY MOUTH EVERY DAY BEFORE BREAKFAST (Patient taking differently: Take 75 mcg by mouth daily before breakfast. TAKE 1 TABLET BY MOUTH EVERY DAY BEFORE BREAKFAST) 90 tablet 3   . Multiple  Vitamin (MULTIVITAMIN) capsule Take 1 capsule by mouth daily.       . potassium chloride (KLOR-CON M10) 10 MEQ tablet Take 1 tablet (10 mEq total) by mouth daily. 90 tablet 3   . Probiotic Product (PHILLIPS COLON HEALTH PO) Take 1 capsule by mouth daily.      . traMADol (ULTRAM-ER) 200 MG 24 hr tablet Take 1 tablet (200 mg total) by mouth daily. 30 tablet 5    Allergies  Allergen Reactions  . Ace Inhibitors Cough  . Lisinopril Other (See Comments)    Cough    Social History   Tobacco Use  . Smoking status: Never Smoker  . Smokeless tobacco: Never Used  Substance Use Topics  .  Alcohol use: No    Family History  Problem Relation Age of Onset  . Colon cancer Mother 30  . Diabetes Mother   . Cancer Mother        Colon Cancer  . Diabetes Other        mother and several others in family  . Stroke Other   . Kidney disease Other   . Heart disease Maternal Grandmother   . Thyroid disease Neg Hx   . Esophageal cancer Neg Hx   . Rectal cancer Neg Hx   . Stomach cancer Neg Hx       Review of Systems  Constitutional: Negative for chills and fever.  Respiratory: Negative for cough and shortness of breath.   Cardiovascular: Negative for chest pain.  Gastrointestinal: Negative for nausea and vomiting.  Musculoskeletal: Positive for arthralgias.     Objective:  Physical Exam Patient is a 64 year old female.  Well nourished and well developed. General: Alert and oriented x3, cooperative and pleasant, no acute distress. Head: normocephalic, atraumatic, neck supple. Eyes: EOMI. Respiratory: breath sounds clear in all fields, no wheezing, rales, or rhonchi. Cardiovascular: Regular rate and rhythm, no murmurs, gallops or rubs. Abdomen: non-tender to palpation and soft, normoactive bowel sounds. Musculoskeletal:  Right knee exam: No effusion. Range of motion: 0 to 120 degrees. Knee is unstable to varus and valgus stressing in full extension. AP laxity in flexion.  Calves soft  and nontender. Motor function intact in LE. Strength 5/5 LE bilaterally. Neuro: Distal pulses 2+. Sensation to light touch intact in LE.  Vital signs in last 24 hours: Labs:  Estimated body mass index is 24.94 kg/m as calculated from the following:   Height as of 03/01/20: 5\' 8"  (1.727 m).   Weight as of 03/01/20: 74.4 kg.  Imaging Review Plain radiographs demonstrate a Johnson & 13/9/21 femur with a MBT tray and a TC3 polyethylene rotating platform. There does not appear to be any definitive periprosthetic loosening.   Bone scan of the right knee demonstrates increased uptake just under the tibial tray, medial and lateral, but it does not extend to the stem portion. She does not have a long stem. She has a standard MBT tray. She does not have any uptake around the femur.  Assessment/Plan:  End stage arthritis, right knee(s) with failed previous arthroplasty.   The patient history, physical examination, clinical judgment of the provider and imaging studies are consistent with end stage degenerative joint disease of the right knee(s), previous total knee arthroplasty. Revision total knee arthroplasty is deemed medically necessary. The treatment options including medical management, injection therapy, arthroscopy and revision arthroplasty were discussed at length. The risks and benefits of revision total knee arthroplasty were presented and reviewed. The risks due to aseptic loosening, infection, stiffness, patella tracking problems, thromboembolic complications and other imponderables were discussed. The patient acknowledged the explanation, agreed to proceed with the plan and consent was signed. Patient is being admitted for inpatient treatment for surgery, pain control, PT, OT, prophylactic antibiotics, VTE prophylaxis, progressive ambulation and ADL's and discharge planning.The patient is planning to be discharged home.   Therapy Plans: outpatient therapy at [facility] Disposition: Home with husband Planned DVT Prophylaxis: aspirin 325mg  BID DME needed: walker PCP: Dr. [employer]tronic, clearance received Cardiographs: Dr. , clearance received TXA: IV Allergies: lisinopril - cough Anesthesia Concerns: none BMI: 25.6 Last HgbA1c: 5.8%  Other:  -Hoping to D/C Same day.  -Thigh high TED hose due to history of  lymphedema.  -States she prefers Dilaudid for pain relief.   - Patient was instructed on what medications to stop prior to surgery. - Follow-up visit in 2 weeks with Dr. Lequita HaltAluisio - Begin physical therapy following surgery - Pre-operative lab work as pre-surgical testing - Prescriptions will be provided in hospital at time of discharge   Dennie BibleAshley Daviana Haymaker, PA-C Orthopedic Surgery [facility] [place] [phone]

## 2020-04-09 ENCOUNTER — Other Ambulatory Visit (HOSPITAL_COMMUNITY)
Admission: RE | Admit: 2020-04-09 | Discharge: 2020-04-09 | Disposition: A | Discharge status: Home / Self Care | Payer: BC Managed Care – PPO | Source: Ambulatory Visit | Attending: Orthopedic Surgery | Admitting: Orthopedic Surgery

## 2020-04-09 DIAGNOSIS — Z01812 Encounter for preprocedural laboratory examination: Secondary | ICD-10-CM | POA: Insufficient documentation

## 2020-04-09 DIAGNOSIS — Z20822 Contact with and (suspected) exposure to covid-19: Secondary | ICD-10-CM | POA: Diagnosis not present

## 2020-04-09 LAB — SARS CORONAVIRUS 2 (TAT 6-24 HRS): SARS Coronavirus 2: NEGATIVE

## 2020-04-10 ENCOUNTER — Other Ambulatory Visit: Payer: Self-pay | Admitting: Internal Medicine

## 2020-04-12 MED ORDER — BUPIVACAINE LIPOSOME 1.3 % IJ SUSP
20.0000 mL | INTRAMUSCULAR | Status: DC
  Filled 2020-04-12: qty 20

## 2020-04-13 ENCOUNTER — Observation Stay (HOSPITAL_COMMUNITY)
Admission: RE | Admit: 2020-04-13 | Discharge: 2020-04-14 | Disposition: A | Discharge status: Home / Self Care | Payer: BC Managed Care – PPO | Attending: Orthopedic Surgery | Admitting: Orthopedic Surgery

## 2020-04-13 ENCOUNTER — Encounter (HOSPITAL_COMMUNITY)
Admission: RE | Disposition: A | Discharge status: Home / Self Care | Payer: Self-pay | Source: Home / Self Care | Attending: Orthopedic Surgery

## 2020-04-13 ENCOUNTER — Ambulatory Visit (HOSPITAL_COMMUNITY): Payer: BC Managed Care – PPO | Admitting: Certified Registered"

## 2020-04-13 ENCOUNTER — Other Ambulatory Visit: Payer: Self-pay

## 2020-04-13 ENCOUNTER — Encounter (HOSPITAL_COMMUNITY): Payer: Self-pay | Admitting: Orthopedic Surgery

## 2020-04-13 DIAGNOSIS — M1711 Unilateral primary osteoarthritis, right knee: Secondary | ICD-10-CM | POA: Insufficient documentation

## 2020-04-13 DIAGNOSIS — Z96659 Presence of unspecified artificial knee joint: Secondary | ICD-10-CM

## 2020-04-13 DIAGNOSIS — T84022A Instability of internal right knee prosthesis, initial encounter: Principal | ICD-10-CM | POA: Insufficient documentation

## 2020-04-13 DIAGNOSIS — E039 Hypothyroidism, unspecified: Secondary | ICD-10-CM | POA: Insufficient documentation

## 2020-04-13 DIAGNOSIS — Z79899 Other long term (current) drug therapy: Secondary | ICD-10-CM | POA: Diagnosis not present

## 2020-04-13 DIAGNOSIS — Y792 Prosthetic and other implants, materials and accessory orthopedic devices associated with adverse incidents: Secondary | ICD-10-CM | POA: Diagnosis not present

## 2020-04-13 DIAGNOSIS — T84018A Broken internal joint prosthesis, other site, initial encounter: Secondary | ICD-10-CM

## 2020-04-13 DIAGNOSIS — I10 Essential (primary) hypertension: Secondary | ICD-10-CM | POA: Diagnosis not present

## 2020-04-13 DIAGNOSIS — Z96651 Presence of right artificial knee joint: Secondary | ICD-10-CM | POA: Insufficient documentation

## 2020-04-13 DIAGNOSIS — T84012A Broken internal right knee prosthesis, initial encounter: Secondary | ICD-10-CM

## 2020-04-13 DIAGNOSIS — E119 Type 2 diabetes mellitus without complications: Secondary | ICD-10-CM | POA: Insufficient documentation

## 2020-04-13 DIAGNOSIS — M25561 Pain in right knee: Secondary | ICD-10-CM | POA: Diagnosis present

## 2020-04-13 DIAGNOSIS — T84012D Broken internal right knee prosthesis, subsequent encounter: Secondary | ICD-10-CM

## 2020-04-13 HISTORY — PX: TOTAL KNEE REVISION: SHX996

## 2020-04-13 LAB — GLUCOSE, CAPILLARY
Glucose-Capillary: 123 mg/dL — ABNORMAL HIGH (ref 70–99)
Glucose-Capillary: 117 mg/dL — ABNORMAL HIGH (ref 70–99)
Glucose-Capillary: 113 mg/dL — ABNORMAL HIGH (ref 70–99)
Glucose-Capillary: 227 mg/dL — ABNORMAL HIGH (ref 70–99)

## 2020-04-13 LAB — TYPE AND SCREEN
ABO/RH(D): B POS
Antibody Screen: NEGATIVE

## 2020-04-13 LAB — ABO/RH: ABO/RH(D): B POS

## 2020-04-13 SURGERY — TOTAL KNEE REVISION
Anesthesia: Spinal | Site: Knee | Laterality: Right

## 2020-04-13 MED ORDER — LACTATED RINGERS IV SOLN: INTRAVENOUS | Status: DC

## 2020-04-13 MED ORDER — ONDANSETRON HCL 4 MG/2ML IJ SOLN
INTRAMUSCULAR | Status: AC
  Filled 2020-04-13: qty 2

## 2020-04-13 MED ORDER — METHOCARBAMOL 1000 MG/10ML IJ SOLN
500.0000 mg | Freq: Four times a day (QID) | INTRAVENOUS | Status: DC | PRN
  Filled 2020-04-13: qty 5

## 2020-04-13 MED ORDER — FLEET ENEMA 7-19 GM/118ML RE ENEM: 1.0000 | ENEMA | Freq: Once | RECTAL | Status: DC | PRN

## 2020-04-13 MED ORDER — IRBESARTAN 150 MG PO TABS
300.0000 mg | ORAL_TABLET | Freq: Every day | ORAL | Status: DC
  Administered 2020-04-14: 10:00:00 300 mg via ORAL
  Filled 2020-04-13: qty 2

## 2020-04-13 MED ORDER — MORPHINE SULFATE (PF) 2 MG/ML IV SOLN: 0.5000 mg | INTRAVENOUS | Status: DC | PRN

## 2020-04-13 MED ORDER — METHOCARBAMOL 500 MG PO TABS: 500.0000 mg | ORAL_TABLET | Freq: Four times a day (QID) | ORAL | Status: DC | PRN

## 2020-04-13 MED ORDER — GUANFACINE HCL 1 MG PO TABS: 2.0000 mg | ORAL_TABLET | Freq: Every day | ORAL | Status: DC

## 2020-04-13 MED ORDER — MEPIVACAINE HCL (PF) 2 % IJ SOLN
INTRAMUSCULAR | Status: DC | PRN
  Administered 2020-04-13: 3 mL via EPIDURAL

## 2020-04-13 MED ORDER — SODIUM CHLORIDE 0.9 % IR SOLN
Status: DC | PRN
  Administered 2020-04-13: 1000 mL

## 2020-04-13 MED ORDER — PROPOFOL 500 MG/50ML IV EMUL
INTRAVENOUS | Status: DC | PRN
  Administered 2020-04-13: 40 ug/kg/min via INTRAVENOUS

## 2020-04-13 MED ORDER — HYDRALAZINE HCL 50 MG PO TABS
50.0000 mg | ORAL_TABLET | Freq: Three times a day (TID) | ORAL | Status: DC
  Administered 2020-04-13 – 2020-04-14 (×2): 50 mg via ORAL
  Filled 2020-04-13 (×2): qty 1

## 2020-04-13 MED ORDER — ONDANSETRON HCL 4 MG/2ML IJ SOLN
INTRAMUSCULAR | Status: DC | PRN
  Administered 2020-04-13: 4 mg via INTRAVENOUS

## 2020-04-13 MED ORDER — DIPHENHYDRAMINE HCL 12.5 MG/5ML PO ELIX: 12.5000 mg | ORAL_SOLUTION | ORAL | Status: DC | PRN

## 2020-04-13 MED ORDER — METOCLOPRAMIDE HCL 5 MG PO TABS: 5.0000 mg | ORAL_TABLET | Freq: Three times a day (TID) | ORAL | Status: DC | PRN

## 2020-04-13 MED ORDER — POVIDONE-IODINE 10 % EX SWAB
2.0000 "application " | Freq: Once | CUTANEOUS | Status: AC
  Administered 2020-04-13: 2 via TOPICAL

## 2020-04-13 MED ORDER — TRANEXAMIC ACID-NACL 1000-0.7 MG/100ML-% IV SOLN
1000.0000 mg | INTRAVENOUS | Status: AC
  Administered 2020-04-13: 14:00:00 1000 mg via INTRAVENOUS
  Filled 2020-04-13: qty 100

## 2020-04-13 MED ORDER — CHLORHEXIDINE GLUCONATE 0.12 % MT SOLN
15.0000 mL | Freq: Once | OROMUCOSAL | Status: AC
  Administered 2020-04-13: 11:00:00 15 mL via OROMUCOSAL

## 2020-04-13 MED ORDER — CEFAZOLIN SODIUM-DEXTROSE 2-4 GM/100ML-% IV SOLN
2.0000 g | Freq: Four times a day (QID) | INTRAVENOUS | Status: AC
  Administered 2020-04-13 – 2020-04-14 (×2): 2 g via INTRAVENOUS
  Filled 2020-04-13 (×2): qty 100

## 2020-04-13 MED ORDER — MIDAZOLAM HCL 2 MG/2ML IJ SOLN
INTRAMUSCULAR | Status: AC
  Filled 2020-04-13: qty 2

## 2020-04-13 MED ORDER — DORZOLAMIDE HCL-TIMOLOL MAL 2-0.5 % OP SOLN
1.0000 [drp] | Freq: Two times a day (BID) | OPHTHALMIC | Status: DC
  Administered 2020-04-13 – 2020-04-14 (×2): 1 [drp] via OPHTHALMIC
  Filled 2020-04-13: qty 10

## 2020-04-13 MED ORDER — ACETAMINOPHEN 500 MG PO TABS
1000.0000 mg | ORAL_TABLET | Freq: Four times a day (QID) | ORAL | Status: DC
  Administered 2020-04-13 – 2020-04-14 (×3): 1000 mg via ORAL
  Filled 2020-04-13 (×4): qty 2

## 2020-04-13 MED ORDER — EPHEDRINE 5 MG/ML INJ
INTRAVENOUS | Status: AC
  Filled 2020-04-13: qty 10

## 2020-04-13 MED ORDER — MIDAZOLAM HCL 2 MG/2ML IJ SOLN
1.0000 mg | INTRAMUSCULAR | Status: DC
  Administered 2020-04-13: 12:00:00 1 mg via INTRAVENOUS
  Filled 2020-04-13: qty 2

## 2020-04-13 MED ORDER — MIDAZOLAM HCL 2 MG/2ML IJ SOLN
INTRAMUSCULAR | Status: DC | PRN
  Administered 2020-04-13: 1 mg via INTRAVENOUS

## 2020-04-13 MED ORDER — ASPIRIN EC 325 MG PO TBEC
325.0000 mg | DELAYED_RELEASE_TABLET | Freq: Two times a day (BID) | ORAL | Status: DC
  Administered 2020-04-14: 10:00:00 325 mg via ORAL
  Filled 2020-04-13: qty 1

## 2020-04-13 MED ORDER — ROPIVACAINE HCL 7.5 MG/ML IJ SOLN
INTRAMUSCULAR | Status: DC | PRN
  Administered 2020-04-13: 20 mL via PERINEURAL

## 2020-04-13 MED ORDER — EPHEDRINE SULFATE-NACL 50-0.9 MG/10ML-% IV SOSY
PREFILLED_SYRINGE | INTRAVENOUS | Status: DC | PRN
  Administered 2020-04-13: 5 mg via INTRAVENOUS
  Administered 2020-04-13: 10 mg via INTRAVENOUS

## 2020-04-13 MED ORDER — TRAMADOL HCL 50 MG PO TABS: 50.0000 mg | ORAL_TABLET | Freq: Four times a day (QID) | ORAL | Status: DC | PRN

## 2020-04-13 MED ORDER — METOCLOPRAMIDE HCL 5 MG/ML IJ SOLN: 5.0000 mg | Freq: Three times a day (TID) | INTRAMUSCULAR | Status: DC | PRN

## 2020-04-13 MED ORDER — FENTANYL CITRATE (PF) 100 MCG/2ML IJ SOLN
50.0000 ug | INTRAMUSCULAR | Status: DC
  Administered 2020-04-13: 12:00:00 50 ug via INTRAVENOUS
  Filled 2020-04-13: qty 2

## 2020-04-13 MED ORDER — POTASSIUM CHLORIDE CRYS ER 10 MEQ PO TBCR
10.0000 meq | EXTENDED_RELEASE_TABLET | Freq: Every day | ORAL | Status: DC
  Administered 2020-04-13 – 2020-04-14 (×2): 10 meq via ORAL
  Filled 2020-04-13 (×2): qty 1

## 2020-04-13 MED ORDER — DEXAMETHASONE SODIUM PHOSPHATE 10 MG/ML IJ SOLN
INTRAMUSCULAR | Status: AC
  Filled 2020-04-13: qty 1

## 2020-04-13 MED ORDER — PROPOFOL 10 MG/ML IV BOLUS
INTRAVENOUS | Status: AC
  Filled 2020-04-13: qty 20

## 2020-04-13 MED ORDER — ONDANSETRON HCL 4 MG PO TABS: 4.0000 mg | ORAL_TABLET | Freq: Four times a day (QID) | ORAL | Status: DC | PRN

## 2020-04-13 MED ORDER — ONDANSETRON HCL 4 MG/2ML IJ SOLN: 4.0000 mg | Freq: Four times a day (QID) | INTRAMUSCULAR | Status: DC | PRN

## 2020-04-13 MED ORDER — BRIMONIDINE TARTRATE 0.15 % OP SOLN
1.0000 [drp] | Freq: Two times a day (BID) | OPHTHALMIC | Status: DC
  Administered 2020-04-13 – 2020-04-14 (×2): 1 [drp] via OPHTHALMIC
  Filled 2020-04-13: qty 5

## 2020-04-13 MED ORDER — LEVOTHYROXINE SODIUM 75 MCG PO TABS
75.0000 ug | ORAL_TABLET | Freq: Every day | ORAL | Status: DC
  Administered 2020-04-14: 06:00:00 75 ug via ORAL
  Filled 2020-04-13: qty 1

## 2020-04-13 MED ORDER — DOCUSATE SODIUM 100 MG PO CAPS
100.0000 mg | ORAL_CAPSULE | Freq: Two times a day (BID) | ORAL | Status: DC
  Administered 2020-04-13 – 2020-04-14 (×2): 100 mg via ORAL
  Filled 2020-04-13 (×2): qty 1

## 2020-04-13 MED ORDER — 0.9 % SODIUM CHLORIDE (POUR BTL) OPTIME
TOPICAL | Status: DC | PRN
  Administered 2020-04-13: 14:00:00 1000 mL

## 2020-04-13 MED ORDER — DEXAMETHASONE SODIUM PHOSPHATE 10 MG/ML IJ SOLN
8.0000 mg | Freq: Once | INTRAMUSCULAR | Status: AC
  Administered 2020-04-13: 14:00:00 8 mg via INTRAVENOUS

## 2020-04-13 MED ORDER — PHENOL 1.4 % MT LIQD: 1.0000 | OROMUCOSAL | Status: DC | PRN

## 2020-04-13 MED ORDER — DILTIAZEM HCL ER COATED BEADS 180 MG PO CP24
360.0000 mg | ORAL_CAPSULE | Freq: Every day | ORAL | Status: DC
  Administered 2020-04-14: 10:00:00 360 mg via ORAL
  Filled 2020-04-13: qty 2

## 2020-04-13 MED ORDER — HYDROMORPHONE HCL 2 MG PO TABS
2.0000 mg | ORAL_TABLET | ORAL | Status: DC | PRN
  Administered 2020-04-13 – 2020-04-14 (×5): 2 mg via ORAL
  Filled 2020-04-13 (×5): qty 1

## 2020-04-13 MED ORDER — ORAL CARE MOUTH RINSE: 15.0000 mL | Freq: Once | OROMUCOSAL | Status: AC

## 2020-04-13 MED ORDER — SODIUM CHLORIDE (PF) 0.9 % IJ SOLN
INTRAMUSCULAR | Status: AC
  Filled 2020-04-13: qty 60

## 2020-04-13 MED ORDER — ACETAMINOPHEN 10 MG/ML IV SOLN
1000.0000 mg | Freq: Once | INTRAVENOUS | Status: AC
  Administered 2020-04-13: 14:00:00 1000 mg via INTRAVENOUS
  Filled 2020-04-13: qty 100

## 2020-04-13 MED ORDER — SODIUM CHLORIDE (PF) 0.9 % IJ SOLN
INTRAMUSCULAR | Status: DC | PRN
  Administered 2020-04-13: 60 mL

## 2020-04-13 MED ORDER — DEXAMETHASONE SODIUM PHOSPHATE 10 MG/ML IJ SOLN
10.0000 mg | Freq: Once | INTRAMUSCULAR | Status: AC
  Administered 2020-04-14: 10:00:00 10 mg via INTRAVENOUS
  Filled 2020-04-13: qty 1

## 2020-04-13 MED ORDER — HYDRALAZINE HCL 50 MG PO TABS: 50.0000 mg | ORAL_TABLET | Freq: Three times a day (TID) | ORAL | Status: DC

## 2020-04-13 MED ORDER — CEFAZOLIN SODIUM-DEXTROSE 2-4 GM/100ML-% IV SOLN
2.0000 g | INTRAVENOUS | Status: AC
  Administered 2020-04-13: 14:00:00 2 g via INTRAVENOUS
  Filled 2020-04-13: qty 100

## 2020-04-13 MED ORDER — BISACODYL 10 MG RE SUPP: 10.0000 mg | Freq: Every day | RECTAL | Status: DC | PRN

## 2020-04-13 MED ORDER — SODIUM CHLORIDE 0.9 % IV SOLN: INTRAVENOUS | Status: DC

## 2020-04-13 MED ORDER — PROPOFOL 10 MG/ML IV BOLUS
INTRAVENOUS | Status: DC | PRN
  Administered 2020-04-13: 30 mg via INTRAVENOUS

## 2020-04-13 MED ORDER — POLYETHYLENE GLYCOL 3350 17 G PO PACK: 17.0000 g | PACK | Freq: Every day | ORAL | Status: DC | PRN

## 2020-04-13 MED ORDER — MENTHOL 3 MG MT LOZG: 1.0000 | LOZENGE | OROMUCOSAL | Status: DC | PRN

## 2020-04-13 MED ORDER — BUPIVACAINE LIPOSOME 1.3 % IJ SUSP
INTRAMUSCULAR | Status: DC | PRN
  Administered 2020-04-13: 20 mL

## 2020-04-13 SURGICAL SUPPLY — 59 items
BAG DECANTER FOR FLEXI CONT (MISCELLANEOUS) ×3 IMPLANT
BAG ZIPLOCK 12X15 (MISCELLANEOUS) IMPLANT
BLADE SAG 18X100X1.27 (BLADE) ×3 IMPLANT
BLADE SAW SGTL 11.0X1.19X90.0M (BLADE) ×3 IMPLANT
BLADE SURG SZ10 CARB STEEL (BLADE) ×6 IMPLANT
BNDG ELASTIC 6X5.8 VLCR STR LF (GAUZE/BANDAGES/DRESSINGS) ×3 IMPLANT
CLOSURE WOUND 1/2 X4 (GAUZE/BANDAGES/DRESSINGS) ×2
CLOTH BEACON ORANGE TIMEOUT ST (SAFETY) ×3 IMPLANT
COVER SURGICAL LIGHT HANDLE (MISCELLANEOUS) ×3 IMPLANT
COVER WAND RF STERILE (DRAPES) IMPLANT
CUFF TOURN SGL QUICK 34 (TOURNIQUET CUFF) ×2
CUFF TRNQT CYL 34X4.125X (TOURNIQUET CUFF) ×1 IMPLANT
DECANTER SPIKE VIAL GLASS SM (MISCELLANEOUS) IMPLANT
DRAPE U-SHAPE 47X51 STRL (DRAPES) ×3 IMPLANT
DRESSING AQUACEL AG SP 3.5X10 (GAUZE/BANDAGES/DRESSINGS) ×1 IMPLANT
DRSG ADAPTIC 3X8 NADH LF (GAUZE/BANDAGES/DRESSINGS) ×3 IMPLANT
DRSG AQUACEL AG SP 3.5X10 (GAUZE/BANDAGES/DRESSINGS) ×3
DRSG PAD ABDOMINAL 8X10 ST (GAUZE/BANDAGES/DRESSINGS) ×3 IMPLANT
DURAPREP 26ML APPLICATOR (WOUND CARE) ×3 IMPLANT
ELECT REM PT RETURN 15FT ADLT (MISCELLANEOUS) ×3 IMPLANT
EVACUATOR 1/8 PVC DRAIN (DRAIN) ×3 IMPLANT
GAUZE SPONGE 4X4 12PLY STRL (GAUZE/BANDAGES/DRESSINGS) ×3 IMPLANT
GLOVE BIO SURGEON STRL SZ8 (GLOVE) ×3 IMPLANT
GLOVE BIOGEL PI IND STRL 8 (GLOVE) ×1 IMPLANT
GLOVE BIOGEL PI IND STRL 8.5 (GLOVE) ×1 IMPLANT
GLOVE BIOGEL PI INDICATOR 8 (GLOVE) ×2
GLOVE BIOGEL PI INDICATOR 8.5 (GLOVE) ×2
GLOVE SURG ENC MOIS LTX SZ6 (GLOVE) ×3 IMPLANT
GLOVE SURG ENC MOIS LTX SZ7 (GLOVE) ×3 IMPLANT
GLOVE SURG UNDER POLY LF SZ6.5 (GLOVE) ×3 IMPLANT
GOWN STRL REUS W/TWL LRG LVL3 (GOWN DISPOSABLE) ×6 IMPLANT
HANDPIECE INTERPULSE COAX TIP (DISPOSABLE) ×2
HOLDER FOLEY CATH W/STRAP (MISCELLANEOUS) ×3 IMPLANT
IMMOBILIZER KNEE 20 (SOFTGOODS) ×3
IMMOBILIZER KNEE 20 THIGH 36 (SOFTGOODS) ×1 IMPLANT
INSERT TIB PFC SZ3 15 (Insert) ×3 IMPLANT
KIT TURNOVER KIT A (KITS) IMPLANT
MANIFOLD NEPTUNE II (INSTRUMENTS) ×3 IMPLANT
NS IRRIG 1000ML POUR BTL (IV SOLUTION) ×3 IMPLANT
PACK TOTAL KNEE CUSTOM (KITS) ×3 IMPLANT
PADDING CAST COTTON 6X4 STRL (CAST SUPPLIES) ×3 IMPLANT
PENCIL SMOKE EVACUATOR (MISCELLANEOUS) ×3 IMPLANT
PROTECTOR NERVE ULNAR (MISCELLANEOUS) ×3 IMPLANT
SET HNDPC FAN SPRY TIP SCT (DISPOSABLE) ×1 IMPLANT
STRIP CLOSURE SKIN 1/2X4 (GAUZE/BANDAGES/DRESSINGS) ×4 IMPLANT
SUT MNCRL AB 4-0 PS2 18 (SUTURE) ×3 IMPLANT
SUT STRATAFIX 0 PDS 27 VIOLET (SUTURE) ×3
SUT VIC AB 2-0 CT1 27 (SUTURE) ×8
SUT VIC AB 2-0 CT1 TAPERPNT 27 (SUTURE) ×4 IMPLANT
SUTURE STRATFX 0 PDS 27 VIOLET (SUTURE) ×1 IMPLANT
SWAB COLLECTION DEVICE MRSA (MISCELLANEOUS) IMPLANT
SWAB CULTURE ESWAB REG 1ML (MISCELLANEOUS) IMPLANT
SYR 50ML LL SCALE MARK (SYRINGE) ×6 IMPLANT
TOWER CARTRIDGE SMART MIX (DISPOSABLE) ×3 IMPLANT
TRAY FOLEY MTR SLVR 16FR STAT (SET/KITS/TRAYS/PACK) ×3 IMPLANT
TUBE KAMVAC SUCTION (TUBING) IMPLANT
TUBE SUCTION HIGH CAP CLEAR NV (SUCTIONS) ×3 IMPLANT
WATER STERILE IRR 1000ML POUR (IV SOLUTION) ×3 IMPLANT
WRAP KNEE MAXI GEL POST OP (GAUZE/BANDAGES/DRESSINGS) ×6 IMPLANT

## 2020-04-13 NOTE — Anesthesia Procedure Notes (Signed)
Spinal  Patient location during procedure: OR Start time: 04/13/2020 1:31 PM Staffing Performed: resident/CRNA  Anesthesiologist: Effie Berkshire, MD Resident/CRNA: Eben Burow, CRNA Preanesthetic Checklist Completed: patient identified, IV checked, site marked, risks and benefits discussed, surgical consent, monitors and equipment checked, pre-op evaluation and timeout performed Spinal Block Patient position: sitting Prep: DuraPrep and site prepped and draped Patient monitoring: heart rate, cardiac monitor, continuous pulse ox and blood pressure Approach: midline Location: L3-4 Injection technique: single-shot Needle Needle type: Pencan  Needle gauge: 24 G Needle length: 9 cm Assessment Sensory level: T4 Additional Notes Pt placed in sitting position, spinal kit expiration date checked and verified, + CSF, - heme, pt tolerated well. Dr Smith Robert present and supervising throughout [name].

## 2020-04-13 NOTE — Progress Notes (Signed)
Assisted Dr. Hollis with right, ultrasound guided, adductor canal block. Side rails up, monitors on throughout procedure. See vital signs in flow sheet. Tolerated Procedure well.  

## 2020-04-13 NOTE — Interval H&P Note (Signed)
History and Physical Interval Note:  04/13/2020 1:16 PM  Penny Yates  has presented today for surgery, with the diagnosis of unstable right total knee arthroplasty.  The various methods of treatment have been discussed with the patient and family. After consideration of risks, benefits and other options for treatment, the patient has consented to  Procedure(s) with comments: Right knee polyethylene versus total knee arthroplasty revision (Right) - as a surgical intervention.  The patient's history has been reviewed, patient examined, no change in status, stable for surgery.  I have reviewed the patient's chart and labs.  Questions were answered to the patient's satisfaction.     Homero Fellers Suly Vukelich

## 2020-04-13 NOTE — Anesthesia Postprocedure Evaluation (Signed)
Anesthesia Post Note  Patient: Penny Yates  Procedure(s) Performed: Right knee polyethylene revision. (Right Knee)     Patient location during evaluation: PACU Anesthesia Type: Spinal Level of consciousness: oriented and awake and alert Pain management: pain level controlled Vital Signs Assessment: post-procedure vital signs reviewed and stable Respiratory status: spontaneous breathing, respiratory function stable and patient connected to nasal cannula oxygen Cardiovascular status: blood pressure returned to baseline and stable Postop Assessment: no headache, no backache and no apparent nausea or vomiting Anesthetic complications: no   No complications documented.  Last Vitals:  Vitals:   04/13/20 1600 04/13/20 1651  BP: (!) 151/74 (!) 163/88  Pulse: (!) 40 (!) 50  Resp: 11 16  Temp: 36.5 C (!) 36.4 C  SpO2: 98% 100%    Last Pain:  Vitals:   04/13/20 1703  TempSrc:   PainSc: 4                  Shelton Silvas

## 2020-04-13 NOTE — Evaluation (Signed)
Physical Therapy Evaluation Patient Details Name: Penny Yates MRN: [id number] DOB: 1956-02-27 Today's Date: 04/13/2020   History of Present Illness  Patient is 64 y.o. female s/p Rt TKR on 04/13/20 with PMH significant for osteoporosis, DM, HTN, hypothyroidism, lymphedema, anemia, depression, Rt TKA in 2007.    Clinical Impression  Penny Yates is a 64 y.o. female POD 0 s/p Rt TKR. Patient reports independence with mobility at baseline. Patient is now limited by functional impairments (see PT problem list below) and requires min assist for transfers and gait with RW. Patient was able to ambulate ~60 feet with RW and min assist. Patient instructed in exercise to facilitate circulation to manage edema and prevent DVT's. Patient will benefit from continued skilled PT interventions to address impairments and progress towards PLOF. Acute PT will follow to progress mobility and stair training in preparation for safe discharge home.     Follow Up Recommendations Follow surgeon's recommendation for DC plan and follow-up therapies;Outpatient PT    Equipment Recommendations  Rolling walker with 5" wheels    Recommendations for Other Services       Precautions / Restrictions Precautions Precautions: Fall Restrictions Weight Bearing Restrictions: No      Mobility  Bed Mobility Overal bed mobility: Needs Assistance Bed Mobility: Supine to Sit     Supine to sit: Min guard;HOB elevated     General bed mobility comments: cues to use bed rail, pt taking extra time    Transfers Overall transfer level: Needs assistance Equipment used: Rolling walker (2 wheeled) Transfers: Sit to/from Stand Sit to Stand: Min assist         General transfer comment: VC's for technique with RW, light assist for power up, pt steady standing.  Ambulation/Gait Ambulation/Gait assistance: Min assist;Min guard Gait Distance (Feet): 60 Feet Assistive device: Rolling walker (2 wheeled) Gait  Pattern/deviations: Step-to pattern;Decreased stride length;Decreased stance time - right Gait velocity: decr   General Gait Details: VC's for step pattern and proximity to RW, no overt LOB noted or buckling at Rt knee.  Stairs            Wheelchair Mobility    Modified Rankin (Stroke Patients Only)       Balance Overall balance assessment: Needs assistance Sitting-balance support: Feet supported Sitting balance-Leahy Scale: Good     Standing balance support: During functional activity;Bilateral upper extremity supported Standing balance-Leahy Scale: Fair                               Pertinent Vitals/Pain Pain Assessment: 0-10 Pain Score: 2  Pain Location: Rt knee Pain Descriptors / Indicators: Aching;Discomfort Pain Intervention(s): Limited activity within patient's tolerance;Monitored during session;Repositioned;Ice applied;Premedicated before session    Home Living Family/patient expects to be discharged to:: Private residence Living Arrangements: Spouse/significant other Available Help at Discharge: Family Type of Home: House Home Access: Stairs to enter Entrance Stairs-Rails: Right Entrance Stairs-Number of Steps: 3 Home Layout: Two level;Full bath on main level;Able to live on main level with bedroom/bathroom Home Equipment: Shower seat      Prior Function Level of Independence: Independent               Hand Dominance   Dominant Hand: Right    Extremity/Trunk Assessment   Upper Extremity Assessment Upper Extremity Assessment: Overall WFL for tasks assessed    Lower Extremity Assessment Lower Extremity Assessment: RLE deficits/detail RLE Deficits / Details: good quad activation, no extensor  lag wtih SLR, 3/5 or better RLE Sensation: WNL RLE Coordination: WNL    Cervical / Trunk Assessment Cervical / Trunk Assessment: Normal  Communication   Communication: No difficulties  Cognition Arousal/Alertness:  Awake/alert Behavior During Therapy: WFL for tasks assessed/performed Overall Cognitive Status: Within Functional Limits for tasks assessed                                        General Comments      Exercises Total Joint Exercises Ankle Circles/Pumps: AROM;Both;20 reps;Seated   Assessment/Plan    PT Assessment Patient needs continued PT services  PT Problem List Decreased strength;Decreased range of motion;Decreased activity tolerance;Decreased balance;Decreased mobility;Decreased knowledge of use of DME;Decreased knowledge of precautions       PT Treatment Interventions DME instruction;Gait training;Stair training;Functional mobility training;Therapeutic activities;Therapeutic exercise;Balance training;Patient/family education    PT Goals (Current goals can be found in the Care Plan section)  Acute Rehab PT Goals Patient Stated Goal: regain independence and go home tomorrow PT Goal Formulation: With patient Time For Goal Achievement: 04/20/20 Potential to Achieve Goals: Good    Frequency 7X/week   Barriers to discharge        Co-evaluation               AM-PAC PT "6 Clicks" Mobility  Outcome Measure Help needed turning from your back to your side while in a flat bed without using bedrails?: None Help needed moving from lying on your back to sitting on the side of a flat bed without using bedrails?: A Little Help needed moving to and from a bed to a chair (including a wheelchair)?: A Little Help needed standing up from a chair using your arms (e.g., wheelchair or bedside chair)?: A Little Help needed to walk in hospital room?: A Little Help needed climbing 3-5 steps with a railing? : A Little 6 Click Score: 19    End of Session Equipment Utilized During Treatment: Gait belt Activity Tolerance: Patient tolerated treatment well Patient left: in chair;with call bell/phone within reach;with chair alarm set;with family/visitor present Nurse  Communication: Mobility status PT Visit Diagnosis: Muscle weakness (generalized) (M62.81);Difficulty in walking, not elsewhere classified (R26.2)    Time: 8144-8185 PT Time Calculation (min) (ACUTE ONLY): 20 min   Charges:   PT Evaluation $PT Eval Low Complexity: 1 Low          Wynn Maudlin, DPT Acute Rehabilitation Services Office [phone] Pager [phone]    Anitra Lauth 04/13/2020, 6:23 PM

## 2020-04-13 NOTE — Progress Notes (Signed)
PAGED DR. Lequita Halt REGARDING PATIENT Penny Yates. MEDS ORDERED TO RESTART APRESOLINE 3X DAY- STARTING BACK TONIGHT. NOTIFIED PHARMACY

## 2020-04-13 NOTE — Plan of Care (Signed)

## 2020-04-13 NOTE — Progress Notes (Signed)
Orthopedic Tech Progress Note Patient Details:  Penny Yates 08-07-1955 [id number]  CPM Right Knee CPM Right Knee: On Right Knee Flexion (Degrees): 40 Right Knee Extension (Degrees): 10  Post Interventions Patient Tolerated: Well Instructions Provided: Care of device  Sheliah Plane 04/13/2020, 5:27 PM

## 2020-04-13 NOTE — Plan of Care (Signed)

## 2020-04-13 NOTE — Brief Op Note (Signed)
04/13/2020  2:20 PM  PATIENT:  Penny Yates  64 y.o. female  PRE-OPERATIVE DIAGNOSIS:  unstable right total knee arthroplasty  POST-OPERATIVE DIAGNOSIS:  unstable right total knee arthroplasty  PROCEDURE:  Procedure(s) with comments: Right knee polyethylene revision. (Right) -  SURGEON:  Surgeon(s) and Role:    Ollen Gross, MD - Primary  PHYSICIAN ASSISTANT:   ASSISTANTS: Nelia Shi, PA-C, Dennie Bible, PA-C   ANESTHESIA:   Adductor canal block and spinal  EBL:  25 ml   BLOOD ADMINISTERED:none  DRAINS: none   LOCAL MEDICATIONS USED:  OTHER Exparel  COUNTS:  YES  TOURNIQUET:   Total Tourniquet Time Documented: Thigh (Right) - 22 minutes Total: Thigh (Right) - 22 minutes   DICTATION: .Other Dictation: Dictation Number [id number]  PLAN OF CARE: Admit for overnight observation  PATIENT DISPOSITION:  PACU - hemodynamically stable.

## 2020-04-13 NOTE — Discharge Instructions (Signed)
Penny Gross, MD [facility] [address], [address] [place], [place] [address] [phone]  POSTOPERATIVE DIRECTIONS  BLOOD CLOT PREVENTION . Take a 325 mg Aspirin two times a day for three weeks following surgery. Then take an 81 mg Aspirin once a day for three weeks. Then discontinue Aspirin. Bonita Quin may resume your vitamins/supplements upon discharge from the hospital. . Do not take any NSAIDs (Advil, Aleve, Ibuprofen, Meloxicam, etc.) until you have discontinued the 325 mg Aspirin.  HOME CARE INSTRUCTIONS  . Remove items at home which could result in a fall. This includes throw rugs or furniture in walking pathways.  . ICE to the affected knee as much as tolerated. Icing helps control swelling. If the swelling is well controlled you will be more comfortable and rehab easier. Continue to use ice on the knee for pain and swelling from surgery. You may notice swelling that will progress down to the foot and ankle. This is normal after surgery. Elevate the leg when you are not up walking on it.    . Continue to use the breathing machine which will help keep your temperature down. It is common for your temperature to cycle up and down following surgery, especially at night when you are not up moving around and exerting yourself. The breathing machine keeps your lungs expanded and your temperature down. . Do not place pillow under the operative knee, focus on keeping the knee straight while resting  DIET You may resume your previous home diet once you are discharged from the hospital.  DRESSING / WOUND CARE / SHOWERING . Keep your bulky bandage on for 2 days. On the third post-operative day you may remove the Ace bandage and gauze. There is a waterproof adhesive bandage on your skin which will stay in place until your first follow-up appointment. Once you remove this you will not need to place another bandage . You may begin showering 3 days  following surgery, but do not submerge the incision under water.  ACTIVITY For the first 5 days, the key is rest and control of pain and swelling . Do your home exercises twice a day starting on post-operative day 3. On the days you go to physical therapy, just do the home exercises once that day. . You should rest, ice and elevate the leg for 50 minutes out of every hour. Get up and walk/stretch for 10 minutes per hour. After 5 days you can increase your activity slowly as tolerated. . Walk with your walker as instructed. Use the walker until you are comfortable transitioning to a cane. Walk with the cane in the opposite hand of the operative leg. You may discontinue the cane once you are comfortable and walking steadily. . Avoid periods of inactivity such as sitting longer than an hour when not asleep. This helps prevent blood clots.  . You may discontinue the knee immobilizer once you are able to perform a straight leg raise while lying down. . You may resume a sexual relationship in one month or when given the OK by your doctor.  . You may return to work once you are cleared by your doctor.  . Do not drive a car for 6 weeks or until released by your surgeon.  . Do not drive while taking narcotics.  TED HOSE STOCKINGS Wear the elastic stockings on both legs for three weeks following surgery during the day. You may remove them at night for sleeping.  WEIGHT BEARING Weight  bearing as tolerated with assist device (walker, cane, etc) as directed, use it as long as suggested by your surgeon or therapist, typically at least 4-6 weeks.  POSTOPERATIVE CONSTIPATION PROTOCOL Constipation - defined medically as fewer than three stools per week and severe constipation as less than one stool per week.  One of the most common issues patients have following surgery is constipation.  Even if you have a regular bowel pattern at home, your normal regimen is likely to be disrupted due to multiple reasons  following surgery.  Combination of anesthesia, postoperative narcotics, change in appetite and fluid intake all can affect your bowels.  In order to avoid complications following surgery, here are some recommendations in order to help you during your recovery period.  . Colace (docusate) - Pick up an over-the-counter form of Colace or another stool softener and take twice a day as long as you are requiring postoperative pain medications.  Take with a full glass of water daily.  If you experience loose stools or diarrhea, hold the colace until you stool forms back up. If your symptoms do not get better within 1 week or if they get worse, check with your doctor. . Dulcolax (bisacodyl) - Pick up over-the-counter and take as directed by the product packaging as needed to assist with the movement of your bowels.  Take with a full glass of water.  Use this product as needed if not relieved by Colace only.  . MiraLax (polyethylene glycol) - Pick up over-the-counter to have on hand. MiraLax is a solution that will increase the amount of water in your bowels to assist with bowel movements.  Take as directed and can mix with a glass of water, juice, soda, coffee, or tea. Take if you go more than two days without a movement. Do not use MiraLax more than once per day. Call your doctor if you are still constipated or irregular after using this medication for 7 days in a row.  If you continue to have problems with postoperative constipation, please contact the office for further assistance and recommendations.  If you experience "the worst abdominal pain ever" or develop nausea or vomiting, please contact the office immediatly for further recommendations for treatment.  ITCHING If you experience itching with your medications, try taking only a single pain pill, or even half a pain pill at a time.  You can also use Benadryl over the counter for itching or also to help with sleep.   MEDICATIONS See your medication  summary on the "After Visit Summary" that the nursing staff will review with you prior to discharge.  You may have some home medications which will be placed on hold until you complete the course of blood thinner medication.  It is important for you to complete the blood thinner medication as prescribed by your surgeon.  Continue your approved medications as instructed at time of discharge.  PRECAUTIONS . If you experience chest pain or shortness of breath - call 911 immediately for transfer to the hospital emergency department.  . If you develop a fever greater that 101 F, purulent drainage from wound, increased redness or drainage from wound, foul odor from the wound/dressing, or calf pain - CONTACT YOUR SURGEON.  FOLLOW-UP APPOINTMENTS Make sure you keep all of your appointments after your operation with your surgeon and caregivers. You should call the office at the above phone number and make an appointment for approximately two weeks after the date of your surgery or on the date instructed by your surgeon outlined in the "After Visit Summary".  RANGE OF MOTION AND STRENGTHENING EXERCISES  Rehabilitation of the knee is important following a knee injury or an operation. After just a few days of immobilization, the muscles of the thigh which control the knee become weakened and shrink (atrophy). Knee exercises are designed to build up the tone and strength of the thigh muscles and to improve knee motion. Often times heat used for twenty to thirty minutes before working out will loosen up your tissues and help with improving the range of motion but do not use heat for the first two weeks following surgery. These exercises can be done on a training (exercise) mat, on the floor, on a table or on a bed. Use what ever works the best and is most comfortable for you Knee exercises include:  . Leg Lifts - While your knee is still immobilized in a splint or cast,  you can do straight leg raises. Lift the leg to 60 degrees, hold for 3 sec, and slowly lower the leg. Repeat 10-20 times 2-3 times daily. Perform this exercise against resistance later as your knee gets better.  Isla Pence and Hamstring Sets - Tighten up the muscle on the front of the thigh (Quad) and hold for 5-10 sec. Repeat this 10-20 times hourly. Hamstring sets are done by pushing the foot backward against an object and holding for 5-10 sec. Repeat as with quad sets.   Leg Slides: Lying on your back, slowly slide your foot toward your buttocks, bending your knee up off the floor (only go as far as is comfortable). Then slowly slide your foot back down until your leg is flat on the floor again.  Angel Wings: Lying on your back spread your legs to the side as far apart as you can without causing discomfort.  A rehabilitation program following serious knee injuries can speed recovery and prevent re-injury in the future due to weakened muscles. Contact your doctor or a physical therapist for more information on knee rehabilitation.   IF YOU ARE TRANSFERRED TO A SKILLED REHAB FACILITY If the patient is transferred to a skilled rehab facility following release from the hospital, a list of the current medications will be sent to the facility for the patient to continue.  When discharged from the skilled rehab facility, please have the facility set up the patient's Home Health Physical Therapy prior to being released. Also, the skilled facility will be responsible for providing the patient with their medications at time of release from the facility to include their pain medication, the muscle relaxants, and their blood thinner medication. If the patient is still at the rehab facility at time of the two week follow up appointment, the skilled rehab facility will also need to assist the patient in arranging follow up appointment in our office and any transportation needs.  MAKE SURE YOU:  . Understand these  instructions.  . Get help right away if you are not doing well or get worse.   DENTAL ANTIBIOTICS:  In most cases prophylactic antibiotics for Dental procdeures after total joint surgery are not necessary.  Exceptions are as follows:  1. History of prior total joint infection  2. Severely immunocompromised (  Organ Transplant, cancer chemotherapy, Rheumatoid biologic meds such as Humera)  3. Poorly controlled diabetes (A1C &gt; 8.0, blood glucose over 200)  If you have one of these conditions, contact your surgeon for an antibiotic prescription, prior to your dental procedure.    Pick up stool softner and laxative for home use following surgery while on pain medications. Do not submerge incision under water. Please use good hand washing techniques while changing dressing each day. May shower starting three days after surgery. Please use a clean towel to pat the incision dry following showers. Continue to use ice for pain and swelling after surgery. Do not use any lotions or creams on the incision until instructed by your surgeon.  

## 2020-04-13 NOTE — Transfer of Care (Signed)
Immediate Anesthesia Transfer of Care Note  Patient: Penny Yates  Procedure(s) Performed: Right knee polyethylene revision. (Right Knee)  Patient Location: PACU  Anesthesia Type:Spinal  Level of Consciousness: awake, alert , oriented and patient cooperative  Airway & Oxygen Therapy: Patient Spontanous Breathing  Post-op Assessment: Report given to RN and Post -op Vital signs reviewed and stable  Post vital signs: Reviewed and stable  Last Vitals:  Vitals Value Taken Time  BP 133/70 04/13/20 1515  Temp    Pulse 45 04/13/20 1516  Resp 14 04/13/20 1516  SpO2 96 % 04/13/20 1516  Vitals shown include unvalidated device data.  Last Pain:  Vitals:   04/13/20 1120  TempSrc:   PainSc: 0-No pain         Complications: No complications documented.

## 2020-04-13 NOTE — Anesthesia Procedure Notes (Signed)
Anesthesia Regional Block: Adductor canal block   Pre-Anesthetic Checklist: ,, timeout performed, Correct Patient, Correct Site, Correct Laterality, Correct Procedure, Correct Position, site marked, Risks and benefits discussed,  Surgical consent,  Pre-op evaluation,  At surgeon's request and post-op pain management  Laterality: Right  Prep: chloraprep       Needles:  Injection technique: Single-shot  Needle Type: Echogenic Stimulator Needle     Needle Length: 9cm  Needle Gauge: 21     Additional Needles:   Procedures:,,,, ultrasound used (permanent image in chart),,,,  Narrative:  Start time: 04/13/2020 11:45 AM End time: 04/13/2020 11:50 AM Injection made incrementally with aspirations every 5 mL.  Performed by: Personally  Anesthesiologist: Shelton Silvas, MD  Additional Notes: Patient tolerated the procedure well. Local anesthetic introduced in an incremental fashion under minimal resistance after negative aspirations. No paresthesias were elicited. After completion of the procedure, no acute issues were identified and patient continued to be monitored by RN.

## 2020-04-13 NOTE — Anesthesia Procedure Notes (Signed)
Procedure Name: MAC Date/Time: 04/13/2020 1:22 PM Performed by: Eben Burow, CRNA Pre-anesthesia Checklist: Patient identified, Emergency Drugs available, Suction available, Patient being monitored and Timeout performed Oxygen Delivery Method: Simple face mask Placement Confirmation: positive ETCO2

## 2020-04-13 NOTE — Anesthesia Preprocedure Evaluation (Addendum)
Anesthesia Evaluation  Patient identified by MRN, date of birth, ID band Patient awake    Reviewed: Allergy & Precautions, NPO status , Patient's Chart, lab work & pertinent test results  Airway Mallampati: II  TM Distance: >3 FB Neck ROM: Full    Dental  (+) Teeth Intact, Dental Advisory Given   Pulmonary sleep apnea ,    breath sounds clear to auscultation       Cardiovascular hypertension, Pt. on medications  Rhythm:Regular Rate:Normal     Neuro/Psych  Headaches, PSYCHIATRIC DISORDERS Depression    GI/Hepatic negative GI ROS, Neg liver ROS,   Endo/Other  diabetesHypothyroidism   Renal/GU      Musculoskeletal  (+) Arthritis ,   Abdominal Normal abdominal exam  (+)   Peds  Hematology   Anesthesia Other Findings - HLD  Reproductive/Obstetrics                            Anesthesia Physical Anesthesia Plan  ASA: II  Anesthesia Plan: Spinal   Post-op Pain Management:  Regional for Post-op pain   Induction: Intravenous  PONV Risk Score and Plan: 3 and Ondansetron, Dexamethasone and Midazolam  Airway Management Planned: Natural Airway and Simple Face Mask  Additional Equipment: None  Intra-op Plan:   Post-operative Plan:   Informed Consent: I have reviewed the patients History and Physical, chart, labs and discussed the procedure including the risks, benefits and alternatives for the proposed anesthesia with the patient or authorized representative who has indicated his/her understanding and acceptance.       Plan Discussed with: CRNA  Anesthesia Plan Comments:        Anesthesia Quick Evaluation

## 2020-04-14 ENCOUNTER — Encounter (HOSPITAL_COMMUNITY): Payer: Self-pay | Admitting: Orthopedic Surgery

## 2020-04-14 DIAGNOSIS — T84022A Instability of internal right knee prosthesis, initial encounter: Secondary | ICD-10-CM | POA: Diagnosis not present

## 2020-04-14 LAB — CBC
HCT: 35.6 % — ABNORMAL LOW (ref 36.0–46.0)
Hemoglobin: 12.6 g/dL (ref 12.0–15.0)
MCH: 29.4 pg (ref 26.0–34.0)
MCHC: 35.4 g/dL (ref 30.0–36.0)
MCV: 83.2 fL (ref 80.0–100.0)
Platelets: 225 10*3/uL (ref 150–400)
RBC: 4.28 MIL/uL (ref 3.87–5.11)
RDW: 14.6 % (ref 11.5–15.5)
WBC: 9 10*3/uL (ref 4.0–10.5)
nRBC: 0 % (ref 0.0–0.2)

## 2020-04-14 LAB — BASIC METABOLIC PANEL
Anion gap: 11 (ref 5–15)
BUN: 12 mg/dL (ref 8–23)
CO2: 25 mmol/L (ref 22–32)
Calcium: 9.1 mg/dL (ref 8.9–10.3)
Chloride: 103 mmol/L (ref 98–111)
Creatinine, Ser: 0.88 mg/dL (ref 0.44–1.00)
GFR, Estimated: >60 mL/min (ref >60)
Glucose, Bld: 183 mg/dL — ABNORMAL HIGH (ref 70–99)
Potassium: 4.2 mmol/L (ref 3.5–5.1)
Sodium: 139 mmol/L (ref 135–145)

## 2020-04-14 MED ORDER — METHOCARBAMOL 500 MG PO TABS: 500.0000 mg | ORAL_TABLET | Freq: Four times a day (QID) | ORAL | 0 refills | Status: DC | PRN

## 2020-04-14 MED ORDER — HYDROMORPHONE HCL 2 MG PO TABS: 2.0000 mg | ORAL_TABLET | Freq: Four times a day (QID) | ORAL | 0 refills | Status: DC | PRN

## 2020-04-14 MED ORDER — ASPIRIN 325 MG PO TBEC: 325.0000 mg | DELAYED_RELEASE_TABLET | Freq: Two times a day (BID) | ORAL | 0 refills | Status: AC

## 2020-04-14 NOTE — Progress Notes (Signed)
   Subjective: 1 Day Post-Op Procedure(s) (LRB): Right knee polyethylene revision. (Right) Patient reports pain as mild.   Patient seen in rounds by Dr. Lequita Halt. Patient is well, and has had no acute complaints or problems other than pain in the right knee. Denies chest pain, SOB, or calf pain. States she is ready to go home. No issues overnight.  We will continue therapy today, ambulated 66' yesterday.  Objective: Vital signs in last 24 hours: Temp:  [96 F (35.6 C)-98.3 F (36.8 C)] 98 F (36.7 C) (12/23 0555) Pulse Rate:  [40-78] 75 (12/23 0555) Resp:  [10-20] 14 (12/23 0555) BP: (133-198)/(69-144) 141/76 (12/23 0555) SpO2:  [96 %-100 %] 100 % (12/23 0555) Weight:  [73.9 kg] 73.9 kg (12/22 1120)  Intake/Output from previous day:  Intake/Output Summary (Last 24 hours) at 04/14/2020 0707 Last data filed at 04/14/2020 0600 Gross per 24 hour  Intake 1909.42 ml  Output 3420 ml  Net -1510.58 ml    Labs: Recent Labs    04/14/20 0314  HGB 12.6   Recent Labs    04/14/20 0314  WBC 9.0  RBC 4.28  HCT 35.6*  PLT 225   Recent Labs    04/14/20 0314  NA 139  K 4.2  CL 103  CO2 25  BUN 12  CREATININE 0.88  GLUCOSE 183*  CALCIUM 9.1   Exam: General - Patient is Alert and Oriented Extremity - Neurologically intact Neurovascular intact Sensation intact distally Dorsiflexion/Plantar flexion intact Dressing - dressing C/D/I Motor Function - intact, moving foot and toes well on exam.   Past Medical History:  Diagnosis Date  . Anemia   . Chronic venous insufficiency    LE's  . Depression    pt denies  . Diabetes mellitus without complication (HCC)    no meds  pt. denies states she is pre diabetic   . Fracture of shaft of right radius 01/06/2013  . History of colonic polyps    multiple of succeeding years 96, 97, 98, and 99  . Hyperlipidemia   . Hypertension   . Hyperthyroidism    s/p rad Iodine-ellison  . Hypothyroidism 01/19/2015  . Leg length discrepancy     right leg longer  . Low back pain   . Lymphedema    left arm, both legs; primary lymphedema  . Lymphedema    legs  . Neuromuscular disorder (HCC)    sciatiaca  . Osteoporosis   . Sacroiliitis (HCC)    history of    Assessment/Plan: 1 Day Post-Op Procedure(s) (LRB): Right knee polyethylene revision. (Right) Principal Problem:   Failed total knee arthroplasty (HCC) Active Problems:   Failed total right knee replacement (HCC)  Estimated body mass index is 24.77 kg/m as calculated from the following:   Height as of this encounter: 5\' 8"  (1.727 m).   Weight as of this encounter: 73.9 kg. Advance diet Up with therapy D/C IV fluids  DVT Prophylaxis - Aspirin Weight bearing as tolerated. Continue therapy.  Plan is to go Home after hospital stay. Plan for discharge after one session of physical therapy if meeting goals. Scheduled for outpatient physical therapy at [facility]. Follow-up in the office in 2 weeks.   The PDMP database was reviewed today prior to any opioid medications being prescribed to this patient.  , PA-C Orthopedic Surgery [phone] 04/14/2020, 7:07 AM

## 2020-04-14 NOTE — Progress Notes (Signed)
Patient discharged to home w/ spouse. Given all belongings, instructions, equipment. Verbalized understanding of all instructions. Escorted to pov via w/c. 

## 2020-04-14 NOTE — Progress Notes (Signed)
Physical Therapy Treatment Patient Details Name: Penny Yates MRN: [id number] DOB: 04/21/1956 Today's Date: 04/14/2020    History of Present Illness Patient is 64 y.o. female s/p Rt TKR on 04/13/20 with PMH significant for osteoporosis, DM, HTN, hypothyroidism, lymphedema, anemia, depression, Rt TKA in 2007.    PT Comments    Patient mobilizing well with acute PT and progressed gait distance to ~220' with RW and supervision. Pt steady, no buckling at Rt Knee and maintained safe position to walker throughout. Pt completed stair mobility with safe sequencing and verbalized safe guarding for family to provide. Educated on exercises in HEP and pt demonstrated each without difficulty. She is safe to discharge home at this time with assist from her family. Acute PT will continue to progress patient during acute stay.   Follow Up Recommendations  Follow surgeon's recommendation for DC plan and follow-up therapies;Outpatient PT     Equipment Recommendations  Rolling walker with 5" wheels    Recommendations for Other Services       Precautions / Restrictions Precautions Precautions: Fall Restrictions Weight Bearing Restrictions: No Other Position/Activity Restrictions: WBAT    Mobility  Bed Mobility               General bed mobility comments: pt OOB in recliner at start of session.  Transfers Overall transfer level: Needs assistance Equipment used: Rolling walker (2 wheeled) Transfers: Sit to/from Stand Sit to Stand: Min guard;Supervision         General transfer comment: pt with good technique for power up from recliner, no assist needed. pt performed sit<>stand from mat table and recliner multiple times.  Ambulation/Gait Ambulation/Gait assistance: Supervision;Min guard Gait Distance (Feet): 220 Feet Assistive device: Rolling walker (2 wheeled) Gait Pattern/deviations: Step-to pattern;Decreased stride length;Decreased stance time - right Gait velocity: fair    General Gait Details: VC's for step pattern and proximity to RW, no overt LOB noted or buckling at Rt knee.   Stairs Stairs: Yes Stairs assistance: Min guard Stair Management: One rail Right;Step to pattern;Forwards;With cane Number of Stairs: 6 (2x3) General stair comments: VC's for safe step sequencing "up with good, down with bad". pt completed 2nd bout without cues from therapist and verbalized safe guarding position for family to provide.   Wheelchair Mobility    Modified Rankin (Stroke Patients Only)       Balance Overall balance assessment: Needs assistance Sitting-balance support: Feet supported Sitting balance-Leahy Scale: Good     Standing balance support: During functional activity;Bilateral upper extremity supported Standing balance-Leahy Scale: Fair                              Cognition Arousal/Alertness: Awake/alert Behavior During Therapy: WFL for tasks assessed/performed Overall Cognitive Status: Within Functional Limits for tasks assessed                                        Exercises Total Joint Exercises Ankle Circles/Pumps: AROM;Both;20 reps;Seated Quad Sets: AROM;Right;10 reps;Seated Short Arc Quad: AROM;Right;5 reps;Seated Heel Slides: AROM;Right;5 reps;Seated Hip ABduction/ADduction: AROM;Right;5 reps;Seated Straight Leg Raises: AROM;Right;5 reps;Seated Long Arc Quad: AROM;Right;5 reps;Seated Knee Flexion: AROM;Right;5 reps;Seated    General Comments        Pertinent Vitals/Pain Pain Assessment: 0-10 Pain Score: 3  Pain Location: Rt knee Pain Descriptors / Indicators: Aching;Discomfort Pain Intervention(s): Limited activity within patient's tolerance;Monitored during session;Repositioned;Ice  applied;Premedicated before session    Home Living                      Prior Function            PT Goals (current goals can now be found in the care plan section) Acute Rehab PT Goals Patient Stated  Goal: regain independence and go home tomorrow PT Goal Formulation: With patient Time For Goal Achievement: 04/20/20 Potential to Achieve Goals: Good Progress towards PT goals: Progressing toward goals    Frequency    7X/week      PT Plan Current plan remains appropriate    Co-evaluation              AM-PAC PT "6 Clicks" Mobility   Outcome Measure  Help needed turning from your back to your side while in a flat bed without using bedrails?: None Help needed moving from lying on your back to sitting on the side of a flat bed without using bedrails?: A Little Help needed moving to and from a bed to a chair (including a wheelchair)?: A Little Help needed standing up from a chair using your arms (e.g., wheelchair or bedside chair)?: A Little Help needed to walk in hospital room?: A Little Help needed climbing 3-5 steps with a railing? : A Little 6 Click Score: 19    End of Session Equipment Utilized During Treatment: Gait belt Activity Tolerance: Patient tolerated treatment well Patient left: in chair;with call bell/phone within reach;with chair alarm set;with family/visitor present Nurse Communication: Mobility status PT Visit Diagnosis: Muscle weakness (generalized) (M62.81);Difficulty in walking, not elsewhere classified (R26.2)     Time: 7564-3329 PT Time Calculation (min) (ACUTE ONLY): 32 min  Charges:  $Gait Training: 8-22 mins $Therapeutic Exercise: 8-22 mins                     Wynn Maudlin, DPT Acute Rehabilitation Services Office [phone] Pager [phone]     Anitra Lauth 04/14/2020, 11:49 AM

## 2020-04-14 NOTE — Op Note (Signed)
NAME: Penny Yates, Penny Yates MEDICAL RECORD SJ:[id number] ACCOUNT [id number] DATE OF BIRTH:07/25/1955 FACILITY[facility] LOCATION: [facility] PHYSICIAN:Saniyyah Elster Dulcy Fanny, MD  OPERATIVE REPORT  DATE OF PROCEDURE:  04/13/2020  PREOPERATIVE DIAGNOSIS:  Unstable right total knee arthroplasty.  POSTOPERATIVE DIAGNOSIS:  Unstable right total knee arthroplasty.  PROCEDURE:  Right knee polyethylene revision.  SURGEON:  Ollen Gross, MD  ASSISTANT:  Nelia Shi, PA-C and also Dennie Bible, PA-C  ANESTHESIA:  Adductor canal block and spinal.  ESTIMATED BLOOD LOSS:  25 mL.  DRAIN:  None.  TOURNIQUET TIME:  21 minutes at 300 mmHg.  COMPLICATIONS:  None.  CONDITION:  Stable to recovery.  BRIEF CLINICAL NOTE:  The patient is a 65 year old female who has an unstable right total knee arthroplasty.  She has significant pain and dysfunction associated with this.  She has had a negative workup for infection and presents now for polyethylene  versus total knee revision.  PROCEDURE IN DETAIL:  After successful administration of adductor canal block and spinal anesthetic, a tourniquet was placed high on her right thigh and right lower extremity prepped and draped in the usual sterile fashion.  Extremity was wrapped in  Esmarch, knee flexed and tourniquet inflated to 300 mmHg.  Exam under anesthesia shows gross laxity to varus and valgus stressing and full extension.  Also significant AP laxity in 90 degrees of flexion.  The knee was flexed and an incision made with a  10 blade through subcutaneous tissue to the extensor mechanism.  Fresh blade was used to make a medial parapatellar arthrotomy.  There was a small amount of fluid present in the joint.  There was some metal stained synovium.  I did a thorough synovectomy  to normal appearing tissue.  The soft tissue over the proximal medial tibia was then subperiosteally elevated to the joint line with a knife and into the semimembranosus bursa with a Cobb  elevator.  Soft tissue laterally was elevated with attention  being paid to avoiding the patellar tendon on tibial tubercle.  I everted the patella and flexed the knee 90 degrees.  I was able to dislocate the tibia from the femur.  He had a Education officer, community [employer] knee in place which was a high flex femur.  I  removed the polyethylene from the tibial tray.  It was a 10 mm thickness for a size 3 femur and tibia.  We trialed with a 15 mm thickness, which allowed for full extension with excellent varus/valgus and anterior/posterior balance throughout full range  of motion.  I removed the trial and inspected the femoral and tibial implants.  Both were stable and were in excellent alignment.  We did not need to change those.  I decided to place the 15 mm thick posterior stabilized rotating platform polyethylene  for the high flex knee.  This was placed in the tibial tray.  The knee was reduced and has outstanding stability throughout full range of motion.  Wound was then copiously irrigated with saline solution.  The arthrotomy was closed with a running 0  Stratafix suture.  Flexion against gravity was 135 degrees.  Tourniquet was released, total time of 21 minutes.  Subcutaneous was then closed with interrupted 2-0 Vicryl and subcuticular running 4-0 Monocryl.  Incisions cleaned and dried and Steri-Strips  and a bulky sterile dressing applied.  She was then placed into a knee immobilizer, awakened and transported to recovery in stable condition.  Surgical assistance is a necessity for this procedure to do it in a safe  and expeditious manner.  Surgical assistance necessary for retraction of vital ligaments and neurovascular structures and for proper positioning of the limb, for removal of the old  implant and safe and accurate placement of the new implant.  HN/NUANCE  D:04/13/2020 T:04/14/2020 JOB:[id number]

## 2020-04-25 NOTE — Discharge Summary (Signed)
Physician Discharge Summary   Patient ID: Penny Yates MRN: [id number] DOB/AGE: Nov 21, 1955 65 y.o.  Admit date: 04/13/2020 Discharge date: 04/14/2020  Primary Diagnosis: Unstable right total knee arthroplasty   Admission Diagnoses:  Past Medical History:  Diagnosis Date  . Anemia   . Chronic venous insufficiency    LE's  . Depression    pt denies  . Diabetes mellitus without complication (HCC)    no meds  pt. denies states she is pre diabetic   . Fracture of shaft of right radius 01/06/2013  . History of colonic polyps    multiple of succeeding years 96, 97, 98, and 99  . Hyperlipidemia   . Hypertension   . Hyperthyroidism    s/p rad Iodine-ellison  . Hypothyroidism 01/19/2015  . Leg length discrepancy    right leg longer  . Low back pain   . Lymphedema    left arm, both legs; primary lymphedema  . Lymphedema    legs  . Neuromuscular disorder (HCC)    sciatiaca  . Osteoporosis   . Sacroiliitis (HCC)    history of   Discharge Diagnoses:   Principal Problem:   Failed total knee arthroplasty (HCC) Active Problems:   Failed total right knee replacement (HCC)  Estimated body mass index is 24.77 kg/m as calculated from the following:   Height as of this encounter: 5\' 8"  (1.727 m).   Weight as of this encounter: 73.9 kg.  Procedure:  Procedure(s) (LRB): Right knee polyethylene revision. (Right)   Consults: None  HPI: The patient is a 65 year old female who has an unstable right total knee arthroplasty.  She has significant pain and dysfunction associated with this.  She has had a negative workup for infection and presents now for polyethylene  versus total knee revision.  Laboratory Data: Admission on 04/13/2020, Discharged on 04/14/2020  Component Date Value Ref Range Status  . ABO/RH(D) 04/13/2020    Final                   Value:B POS Performed at [facility], [address], [place], [place]   . Glucose-Capillary  04/13/2020 123* 70 - 99 mg/dL Final   Glucose reference range applies only to samples taken after fasting for at least 8 hours.  . Comment 1 04/13/2020 Notify RN   Final  . Comment 2 04/13/2020 Document in Chart   Final  . Glucose-Capillary 04/13/2020 117* 70 - 99 mg/dL Final   Glucose reference range applies only to samples taken after fasting for at least 8 hours.  . Glucose-Capillary 04/13/2020 113* 70 - 99 mg/dL Final   Glucose reference range applies only to samples taken after fasting for at least 8 hours.  . WBC 04/14/2020 9.0  4.0 - 10.5 K/uL Final  . RBC 04/14/2020 4.28  3.87 - 5.11 MIL/uL Final  . Hemoglobin 04/14/2020 12.6  12.0 - 15.0 g/dL Final  . HCT 04/16/2020 35.6* 36.0 - 46.0 % Final  . MCV 04/14/2020 83.2  80.0 - 100.0 fL Final  . MCH 04/14/2020 29.4  26.0 - 34.0 pg Final  . MCHC 04/14/2020 35.4  30.0 - 36.0 g/dL Final  . RDW 04/16/2020 14.6  11.5 - 15.5 % Final  . Platelets 04/14/2020 225  150 - 400 K/uL Final  . nRBC 04/14/2020 0.0  0.0 - 0.2 % Final   Performed at [facility], [address], [place], [place]  . Sodium 04/14/2020 139  135 -  145 mmol/L Final  . Potassium 04/14/2020 4.2  3.5 - 5.1 mmol/L Final  . Chloride 04/14/2020 103  98 - 111 mmol/L Final  . CO2 04/14/2020 25  22 - 32 mmol/L Final  . Glucose, Bld 04/14/2020 183* 70 - 99 mg/dL Final   Glucose reference range applies only to samples taken after fasting for at least 8 hours.  . BUN 04/14/2020 12  8 - 23 mg/dL Final  . Creatinine, Ser 04/14/2020 0.88  0.44 - 1.00 mg/dL Final  . Calcium 50/93/2671 9.1  8.9 - 10.3 mg/dL Final  . GFR, Estimated 04/14/2020 >60  >60 mL/min Final   Comment: (NOTE) Calculated using the CKD-EPI Creatinine Equation (2021)   . Anion gap 04/14/2020 11  5 - 15 Final   Performed at [facility], [address] [place], [place] [address]  . Glucose-Capillary 04/13/2020 227* 70 - 99 mg/dL Final   Glucose reference range applies  only to samples taken after fasting for at least 8 hours.  Hospital Outpatient Visit on 04/09/2020  Component Date Value Ref Range Status  . SARS Coronavirus 2 04/09/2020 NEGATIVE  NEGATIVE Final   Comment: (NOTE) SARS-CoV-2 target nucleic acids are NOT DETECTED.  The SARS-CoV-2 RNA is generally detectable in upper and lower respiratory specimens during the acute phase of infection. Negative results do not preclude SARS-CoV-2 infection, do not rule out co-infections with other pathogens, and should not be used as the sole basis for treatment or other patient management decisions. Negative results must be combined with clinical observations, patient history, and epidemiological information. The expected result is Negative.  Fact Sheet for Patients: HairSlick.no  Fact Sheet for Healthcare Providers: quierodirigir.com  This test is not yet approved or cleared by the [place] FDA and  has been authorized for detection and/or diagnosis of SARS-CoV-2 by FDA under an Emergency Use Authorization (EUA). This EUA will remain  in effect (meaning this test can be used) for the duration of the COVID-19 declaration under Se                          ction 564(b)(1) of the Act, 21 U.S.C. section 360bbb-3(b)(1), unless the authorization is terminated or revoked sooner.  Performed at [facility], [address], [place], [place] [address]   Hospital Outpatient Visit on 04/06/2020  Component Date Value Ref Range Status  . Hgb A1c MFr Bld 04/06/2020 5.9* 4.8 - 5.6 % Final   Comment: (NOTE) Pre diabetes:          5.7%-6.4%  Diabetes:              >6.4%  Glycemic control for   <7.0% adults with diabetes   . Mean Plasma Glucose 04/06/2020 122.63  mg/dL Final   Performed at [facility], [address], [place], [place] [address]  . WBC 04/06/2020 7.1  4.0 - 10.5 K/uL Final  . RBC 04/06/2020 4.41  3.87 - 5.11 MIL/uL Final   . Hemoglobin 04/06/2020 13.0  12.0 - 15.0 g/dL Final  . HCT 39/76/7341 36.4  36.0 - 46.0 % Final  . MCV 04/06/2020 82.5  80.0 - 100.0 fL Final  . MCH 04/06/2020 29.5  26.0 - 34.0 pg Final  . MCHC 04/06/2020 35.7  30.0 - 36.0 g/dL Final  . RDW 93/79/0240 14.8  11.5 - 15.5 % Final  . Platelets 04/06/2020 227  150 - 400 K/uL Final  . nRBC 04/06/2020 0.0  0.0 - 0.2 % Final   Performed at [facility], [address] [place], [place] [address]  . Sodium 04/06/2020 141  135 - 145 mmol/L Final  . Potassium 04/06/2020 4.6  3.5 - 5.1 mmol/L Final  . Chloride 04/06/2020 104  98 - 111 mmol/L Final  . CO2 04/06/2020 29  22 - 32 mmol/L Final  . Glucose, Bld 04/06/2020 113* 70 - 99 mg/dL Final   Glucose reference range applies only to samples taken after fasting for at least 8 hours.  . BUN 04/06/2020 17  8 - 23 mg/dL Final  . Creatinine, Ser 04/06/2020 0.71  0.44 - 1.00 mg/dL Final  . Calcium 60/45/409812/15/2021 9.7  8.9 - 10.3 mg/dL Final  . Total Protein 04/06/2020 7.1  6.5 - 8.1 g/dL Final  . Albumin 11/91/478212/15/2021 3.8  3.5 - 5.0 g/dL Final  . AST 95/62/130812/15/2021 19  15 - 41 U/L Final  . ALT 04/06/2020 18  0 - 44 U/L Final  . Alkaline Phosphatase 04/06/2020 73  38 - 126 U/L Final  . Total Bilirubin 04/06/2020 0.4  0.3 - 1.2 mg/dL Final  . GFR, Estimated 04/06/2020 >60  >60 mL/min Final   Comment: (NOTE) Calculated using the CKD-EPI Creatinine Equation (2021)   . Anion gap 04/06/2020 8  5 - 15 Final   Performed at [facility], [address] [place], [place] [address]  . Prothrombin Time 04/06/2020 13.1  11.4 - 15.2 seconds Final  . INR 04/06/2020 1.0  0.8 - 1.2 Final   Comment: (NOTE) INR goal varies based on device and disease states. Performed at [facility], [address] [place], [place] [address]   . aPTT 04/06/2020 28  24 - 36 seconds Final   Performed at [facility], [address] [place], [place] [address]  .  ABO/RH(D) 04/06/2020 B POS   Final  . Antibody Screen 04/06/2020 NEG   Final  . Sample Expiration 04/06/2020 04/16/2020,2359   Final  . Extend sample reason 04/06/2020    Final                   Value:NO TRANSFUSIONS OR PREGNANCY IN THE PAST 3 MONTHS Performed at [facility], [address] [place], [place] [address]   . MRSA, PCR 04/06/2020 NEGATIVE  NEGATIVE Final  . Staphylococcus aureus 04/06/2020 NEGATIVE  NEGATIVE Final   Comment: (NOTE) The Xpert SA Assay (FDA approved for NASAL specimens in patients 67 years of age and older), is one component of a comprehensive surveillance program. It is not intended to diagnose infection nor to guide or monitor treatment. Performed at [facility], [address] [place], [place] [address]   . Glucose-Capillary 04/06/2020 64* 70 - 99 mg/dL Final   Glucose reference range applies only to samples taken after fasting for at least 8 hours.  . Glucose-Capillary 04/06/2020 106* 70 - 99 mg/dL Final   Glucose reference range applies only to samples taken after fasting for at least 8 hours.     X-Rays:No results found.  EKG: Orders placed or performed in visit on 02/08/20  . EKG 12-Lead     Hospital Course: Penny BeardsDeborah S Yates is a 65 y.o. who was admitted to [facility]. They were brought to the operating room on 04/13/2020 and underwent Procedure(s): Right knee polyethylene revision..  Patient tolerated the procedure well and was later transferred to the recovery room and then to the  orthopaedic floor for postoperative care. They were given PO and IV analgesics for pain control following their surgery. They were given 24 hours of postoperative antibiotics of  Anti-infectives (From admission, onward)   Start     Dose/Rate Route Frequency Ordered Stop   04/13/20 2000  ceFAZolin (ANCEF) IVPB 2g/100 mL premix        2 g 200 mL/hr over 30 Minutes Intravenous Every 6 hours 04/13/20 1630 04/14/20 0217    04/13/20 1045  ceFAZolin (ANCEF) IVPB 2g/100 mL premix        2 g 200 mL/hr over 30 Minutes Intravenous On call to O.R. 04/13/20 1036 04/13/20 1333     and started on DVT prophylaxis in the form of Aspirin.   PT and OT were ordered for total joint protocol. Discharge planning consulted to help with postop disposition and equipment needs.  Patient had a good night on the evening of surgery. They started to get up OOB with therapy on POD #0. Pt was seen during rounds and was ready to go home pending progress with therapy. She worked with therapy on POD #1 and was meeting her goals. Pt was discharged to home later that day in stable condition.  Diet: Regular diet Activity: WBAT Follow-up: in 2 weeks Disposition: Home with outpatient physical therapy Discharged Condition: stable   Discharge Instructions    Call MD / Call 911   Complete by: As directed    If you experience chest pain or shortness of breath, CALL 911 and be transported to the hospital emergency room.  If you develope a fever above 101 F, pus (white drainage) or increased drainage or redness at the wound, or calf pain, call your surgeon's office.   Change dressing   Complete by: As directed    You may remove the bulky bandage (ACE wrap and gauze) two days after surgery. You will have an adhesive waterproof bandage underneath. Leave this in place until your first follow-up appointment.   Constipation Prevention   Complete by: As directed    Drink plenty of fluids.  Prune juice may be helpful.  You may use a stool softener, such as Colace (over the counter) 100 mg twice a day.  Use MiraLax (over the counter) for constipation as needed.   Diet - low sodium heart healthy   Complete by: As directed    Do not put a pillow under the knee. Place it under the heel.   Complete by: As directed    Driving restrictions   Complete by: As directed    No driving for two weeks   TED hose   Complete by: As directed    Use stockings (TED hose)  for three weeks on both leg(s).  You may remove them at night for sleeping.   Weight bearing as tolerated   Complete by: As directed      Allergies as of 04/14/2020      Reactions   Ace Inhibitors Cough   Lisinopril Other (See Comments)   Cough      Medication List    TAKE these medications   alendronate 70 MG tablet Commonly known as: FOSAMAX Take 1 tablet (70 mg total) by mouth every 7 (seven) days. Take with a full glass of water on an empty stomach.   Alphagan P 0.1 % Soln Generic drug: brimonidine Place 1 drop into both eyes in the morning and at bedtime.   amoxicillin 500 MG capsule Commonly known as: AMOXIL Take 1,000 mg by  mouth See admin instructions. Take 2 capsules by mouth 1 hr prior to dental procedure, then 2 capsules 4 hrs after   ascorbic acid 500 MG tablet Commonly known as: VITAMIN C Take 500 mg by mouth daily.   aspirin 325 MG EC tablet Take 1 tablet (325 mg total) by mouth 2 (two) times daily for 20 days. Then take one 81 mg aspirin once a day for three weeks. Then discontinue aspirin.   atorvastatin 10 MG tablet Commonly known as: LIPITOR Take 1 tablet (10 mg total) by mouth daily.   Biotin 5000 MCG Caps Take 5,000 mcg by mouth daily.   cholecalciferol 25 MCG (1000 UNIT) tablet Commonly known as: VITAMIN D3 Take 1,000 Units by mouth daily.   diclofenac Sodium 1 % Gel Commonly known as: VOLTAREN Apply 2 g topically 4 (four) times daily as needed (pain).   diltiazem 360 MG 24 hr capsule Commonly known as: CARDIZEM CD TAKE 1 CAPSULE BY MOUTH EVERY DAY What changed:   how much to take  additional instructions   dorzolamide-timolol 22.3-6.8 MG/ML ophthalmic solution Commonly known as: COSOPT Place 1 drop into both eyes 2 (two) times daily.   Durezol 0.05 % Emul Generic drug: Difluprednate Place 1 drop into the right eye every other day.   fish oil-omega-3 fatty acids 1000 MG capsule Take 1 g by mouth daily.   furosemide 40 MG  tablet Commonly known as: LASIX Take 1 tablet (40 mg total) by mouth daily as needed for edema.   guanFACINE 2 MG tablet Commonly known as: TENEX Take 1 tablet (2 mg total) by mouth at bedtime.   hydrALAZINE 50 MG tablet Commonly known as: APRESOLINE Take 1 tablet (50 mg total) by mouth 3 (three) times daily.   HYDROmorphone 2 MG tablet Commonly known as: DILAUDID Take 1-2 tablets (2-4 mg total) by mouth every 6 (six) hours as needed for severe pain.   irbesartan 300 MG tablet Commonly known as: AVAPRO TAKE 1 TABLET BY MOUTH EVERY DAY   latanoprost 0.005 % ophthalmic solution Commonly known as: XALATAN Place 1 drop into both eyes at bedtime.   levothyroxine 75 MCG tablet Commonly known as: SYNTHROID TAKE 1 TABLET BY MOUTH EVERY DAY BEFORE BREAKFAST What changed:   how much to take  how to take this  when to take this   methocarbamol 500 MG tablet Commonly known as: ROBAXIN Take 1 tablet (500 mg total) by mouth every 6 (six) hours as needed for muscle spasms.   multivitamin capsule Take 1 capsule by mouth daily.   PHILLIPS COLON HEALTH PO Take 1 capsule by mouth daily.   potassium chloride 10 MEQ tablet Commonly known as: Klor-Con M10 Take 1 tablet (10 mEq total) by mouth daily.   traMADol 200 MG 24 hr tablet Commonly known as: ULTRAM-ER TAKE 1 TABLET BY MOUTH EVERY DAY            Discharge Care Instructions  (From admission, onward)         Start     Ordered   04/14/20 0000  Weight bearing as tolerated        04/14/20 0711   04/14/20 0000  Change dressing       Comments: You may remove the bulky bandage (ACE wrap and gauze) two days after surgery. You will have an adhesive waterproof bandage underneath. Leave this in place until your first follow-up appointment.   04/14/20 0711          Follow-up Information  Ollen Gross, MD. Schedule an appointment as soon as possible for a visit on 04/26/2020.   Specialty: Orthopedic Surgery Contact  information: [address] [place] [address] [phone]               Signed: Arther Abbott, PA-C Orthopedic Surgery 04/25/2020, 8:33 AM

## 2020-05-10 ENCOUNTER — Other Ambulatory Visit (INDEPENDENT_AMBULATORY_CARE_PROVIDER_SITE_OTHER): Payer: Self-pay

## 2020-05-10 DIAGNOSIS — E559 Vitamin D deficiency, unspecified: Secondary | ICD-10-CM

## 2020-05-10 DIAGNOSIS — E538 Deficiency of other specified B group vitamins: Secondary | ICD-10-CM

## 2020-05-10 DIAGNOSIS — Z0000 Encounter for general adult medical examination without abnormal findings: Secondary | ICD-10-CM

## 2020-05-10 LAB — HEPATIC FUNCTION PANEL
ALT: 24 U/L (ref 0–35)
AST: 20 U/L (ref 0–37)
Albumin: 3.8 g/dL (ref 3.5–5.2)
Alkaline Phosphatase: 64 U/L (ref 39–117)
Bilirubin, Direct: 0.1 mg/dL (ref 0.0–0.3)
Total Bilirubin: 0.4 mg/dL (ref 0.2–1.2)
Total Protein: 6.6 g/dL (ref 6.0–8.3)

## 2020-05-10 LAB — URINALYSIS, ROUTINE W REFLEX MICROSCOPIC
Bilirubin Urine: NEGATIVE
Hgb urine dipstick: NEGATIVE
Ketones, ur: NEGATIVE
Leukocytes,Ua: NEGATIVE
Nitrite: NEGATIVE
Specific Gravity, Urine: 1.025 (ref 1.000–1.030)
Total Protein, Urine: NEGATIVE
Urine Glucose: NEGATIVE
Urobilinogen, UA: 0.2 (ref 0.0–1.0)
pH: 6.5 (ref 5.0–8.0)

## 2020-05-10 LAB — LIPID PANEL
Cholesterol: 169 mg/dL (ref 0–200)
HDL: 77.5 mg/dL (ref >39.00)
LDL Cholesterol: 79 mg/dL (ref 0–99)
NonHDL: 91.06
Total CHOL/HDL Ratio: 2
Triglycerides: 62 mg/dL (ref 0.0–149.0)
VLDL: 12.4 mg/dL (ref 0.0–40.0)

## 2020-05-10 LAB — CBC WITH DIFFERENTIAL/PLATELET
Basophils Absolute: 0 10*3/uL (ref 0.0–0.1)
Basophils Relative: 0.4 % (ref 0.0–3.0)
Eosinophils Absolute: 0.3 10*3/uL (ref 0.0–0.7)
Eosinophils Relative: 4.5 % (ref 0.0–5.0)
HCT: 36.1 % (ref 36.0–46.0)
Hemoglobin: 12.3 g/dL (ref 12.0–15.0)
Lymphocytes Relative: 44.2 % (ref 12.0–46.0)
Lymphs Abs: 2.5 10*3/uL (ref 0.7–4.0)
MCHC: 34.1 g/dL (ref 30.0–36.0)
MCV: 86 fl (ref 78.0–100.0)
Monocytes Absolute: 0.5 10*3/uL (ref 0.1–1.0)
Monocytes Relative: 9.3 % (ref 3.0–12.0)
Neutro Abs: 2.3 10*3/uL (ref 1.4–7.7)
Neutrophils Relative %: 41.6 % — ABNORMAL LOW (ref 43.0–77.0)
Platelets: 331 10*3/uL (ref 150.0–400.0)
RBC: 4.19 Mil/uL (ref 3.87–5.11)
RDW: 16.5 % — ABNORMAL HIGH (ref 11.5–15.5)
WBC: 5.6 10*3/uL (ref 4.0–10.5)

## 2020-05-10 LAB — VITAMIN B12: Vitamin B-12: 472 pg/mL (ref 211–911)

## 2020-05-10 LAB — BASIC METABOLIC PANEL
BUN: 20 mg/dL (ref 6–23)
CO2: 28 mEq/L (ref 19–32)
Calcium: 9.3 mg/dL (ref 8.4–10.5)
Chloride: 103 mEq/L (ref 96–112)
Creatinine, Ser: 0.89 mg/dL (ref 0.40–1.20)
GFR: 67.5 mL/min (ref >60.00)
Glucose, Bld: 102 mg/dL — ABNORMAL HIGH (ref 70–99)
Potassium: 4.5 mEq/L (ref 3.5–5.1)
Sodium: 137 mEq/L (ref 135–145)

## 2020-05-10 LAB — TSH: TSH: 7.03 u[IU]/mL — ABNORMAL HIGH (ref 0.35–4.50)

## 2020-05-10 LAB — VITAMIN D 25 HYDROXY (VIT D DEFICIENCY, FRACTURES): VITD: 44.96 ng/mL (ref 30.00–100.00)

## 2020-05-11 ENCOUNTER — Other Ambulatory Visit: Payer: Self-pay

## 2020-05-11 ENCOUNTER — Encounter: Payer: Self-pay | Admitting: Internal Medicine

## 2020-05-11 ENCOUNTER — Ambulatory Visit (INDEPENDENT_AMBULATORY_CARE_PROVIDER_SITE_OTHER): Payer: BC Managed Care – PPO | Admitting: Internal Medicine

## 2020-05-11 ENCOUNTER — Telehealth: Payer: Self-pay

## 2020-05-11 DIAGNOSIS — I10 Essential (primary) hypertension: Secondary | ICD-10-CM | POA: Diagnosis not present

## 2020-05-11 DIAGNOSIS — N760 Acute vaginitis: Secondary | ICD-10-CM

## 2020-05-11 DIAGNOSIS — R739 Hyperglycemia, unspecified: Secondary | ICD-10-CM | POA: Diagnosis not present

## 2020-05-11 DIAGNOSIS — E039 Hypothyroidism, unspecified: Secondary | ICD-10-CM

## 2020-05-11 DIAGNOSIS — I890 Lymphedema, not elsewhere classified: Secondary | ICD-10-CM

## 2020-05-11 DIAGNOSIS — B9689 Other specified bacterial agents as the cause of diseases classified elsewhere: Secondary | ICD-10-CM

## 2020-05-11 DIAGNOSIS — E7849 Other hyperlipidemia: Secondary | ICD-10-CM

## 2020-05-11 MED ORDER — LEVOTHYROXINE SODIUM 100 MCG PO TABS: 100.0000 ug | ORAL_TABLET | Freq: Every day | ORAL | 3 refills | Status: DC

## 2020-05-11 MED ORDER — METRONIDAZOLE 250 MG PO TABS: 250.0000 mg | ORAL_TABLET | Freq: Three times a day (TID) | ORAL | 0 refills | Status: AC

## 2020-05-11 MED ORDER — TRAMADOL HCL ER 200 MG PO TB24: 200.0000 mg | ORAL_TABLET | Freq: Every day | ORAL | 1 refills | Status: DC

## 2020-05-11 NOTE — Assessment & Plan Note (Signed)
Lab Results  Component Value Date   TSH 7.03 (H) 05/10/2020   Stable, pt to continue levothyroxine but increased dose 100 mcg qd  Current Outpatient Medications (Endocrine & Metabolic):  .  alendronate (FOSAMAX) 70 MG tablet, Take 1 tablet (70 mg total) by mouth every 7 (seven) days. Take with a full glass of water on an empty stomach. .  levothyroxine (SYNTHROID) 100 MCG tablet, Take 1 tablet (100 mcg total) by mouth daily.  Current Outpatient Medications (Cardiovascular):  .  atorvastatin (LIPITOR) 10 MG tablet, Take 1 tablet (10 mg total) by mouth daily. Marland Kitchen  diltiazem (CARDIZEM CD) 360 MG 24 hr capsule, TAKE 1 CAPSULE BY MOUTH EVERY DAY (Patient taking differently: Take 360 mg by mouth daily. TAKE 1 CAPSULE BY MOUTH EVERY DAY) .  furosemide (LASIX) 40 MG tablet, Take 1 tablet (40 mg total) by mouth daily as needed for edema. Marland Kitchen  guanFACINE (TENEX) 2 MG tablet, Take 1 tablet (2 mg total) by mouth at bedtime. .  hydrALAZINE (APRESOLINE) 50 MG tablet, Take 1 tablet (50 mg total) by mouth 3 (three) times daily. .  irbesartan (AVAPRO) 300 MG tablet, TAKE 1 TABLET BY MOUTH EVERY DAY (Patient taking differently: Take 300 mg by mouth daily.)   Current Outpatient Medications (Analgesics):  .  HYDROmorphone (DILAUDID) 2 MG tablet, Take 1-2 tablets (2-4 mg total) by mouth every 6 (six) hours as needed for severe pain. .  traMADol (ULTRAM-ER) 200 MG 24 hr tablet, Take 1 tablet (200 mg total) by mouth daily.   Current Outpatient Medications (Other):  Marland Kitchen  ALPHAGAN P 0.1 % SOLN, Place 1 drop into both eyes in the morning and at bedtime.  Marland Kitchen  amoxicillin (AMOXIL) 500 MG capsule, Take 1,000 mg by mouth See admin instructions. Take 2 capsules by mouth 1 hr prior to dental procedure, then 2 capsules 4 hrs after .  ascorbic acid (VITAMIN C) 500 MG tablet, Take 500 mg by mouth daily. .  Biotin 5000 MCG CAPS, Take 5,000 mcg by mouth daily. .  cholecalciferol (VITAMIN D3) 25 MCG (1000 UNIT) tablet, Take 1,000  Units by mouth daily. .  diclofenac Sodium (VOLTAREN) 1 % GEL, Apply 2 g topically 4 (four) times daily as needed (pain).  .  dorzolamide-timolol (COSOPT) 22.3-6.8 MG/ML ophthalmic solution, Place 1 drop into both eyes 2 (two) times daily.  .  DUREZOL 0.05 % EMUL, Place 1 drop into the right eye every other day.  .  fish oil-omega-3 fatty acids 1000 MG capsule, Take 1 g by mouth daily. Marland Kitchen  latanoprost (XALATAN) 0.005 % ophthalmic solution, Place 1 drop into both eyes at bedtime. .  methocarbamol (ROBAXIN) 500 MG tablet, Take 1 tablet (500 mg total) by mouth every 6 (six) hours as needed for muscle spasms. .  metroNIDAZOLE (FLAGYL) 250 MG tablet, Take 1 tablet (250 mg total) by mouth 3 (three) times daily for 7 days. .  Multiple Vitamin (MULTIVITAMIN) capsule, Take 1 capsule by mouth daily. .  potassium chloride (KLOR-CON M10) 10 MEQ tablet, Take 1 tablet (10 mEq total) by mouth daily. .  Probiotic Product (PHILLIPS COLON HEALTH PO), Take 1 capsule by mouth daily.

## 2020-05-11 NOTE — Telephone Encounter (Signed)
Prior authorization was completed for tramadol.

## 2020-05-11 NOTE — Assessment & Plan Note (Signed)
Lab Results  Component Value Date   HGBA1C 5.9 (H) 04/06/2020   Stable, pt to continue current medical treatment - diet

## 2020-05-11 NOTE — Assessment & Plan Note (Signed)
Lab Results  Component Value Date   LDLCALC 79 05/10/2020   Stable, pt to continue current statin  - lipitor 10 mg

## 2020-05-11 NOTE — Patient Instructions (Addendum)
Please take all new medication as prescribed - the antibiotic  Ok to increase the levothryoxine to 100 mcg per day  Please continue all other medications as before, and refills have been done if requested - the tramadol  Please have the pharmacy call with any other refills you may need.  Please continue your efforts at being more active, low cholesterol diet, and weight control  Please keep your appointments with your specialists as you may have planned - orthopedic  Please make an Appointment to return in 6 months, or sooner if needed

## 2020-05-11 NOTE — Assessment & Plan Note (Signed)
RLE - stable, cont compressions stockings

## 2020-05-11 NOTE — Assessment & Plan Note (Signed)
Incidental, for flagyl asd,  to f/u any worsening symptoms or concerns

## 2020-05-11 NOTE — Progress Notes (Signed)
Established Patient Office Visit  Subjective:  Patient ID: Penny Yates, female    DOB: 1955-11-25  Age: 65 y.o. MRN: [id number]      Chief Complaint: follow up HTN, HLD and hyperglycemia , bacterial vaginosis, knee pain, hypothyroidism, lymphedema       HPI:  Penny Yates is a 65 y.o. female here to f/u approx 3 wks s/p right knee TKR with acute on chronic pain persisting, on dilaudid qhs and tramadol prn daytime pain overall doing relatively well.  No falling, fever. Getting PT s/p right TKR - now up to 112 degrees ROM, gait still a problem still with signficant pain and lbp as well, taking tramadol, has f/u with Dr Lequita Halt x 3 wks, has f/u jan 25.  As dilaudid only once daily for PT days only.  Still has large chronic RLE lymphedema no change, wears the compression stockings \.  BP has been < 140/90 at home, not sure why high today.  UA recently has noted incidental clue cells and pt without vaginal d/c or GU symptoms.  Denies hyper or hypo thyroid symptoms such as voice, skin or hair change.  Pt denies chest pain, increased sob or doe, wheezing, orthopnea, PND, increased LE swelling, palpitations, dizziness or syncope.   Pt denies polydipsia, polyuria.        Wt Readings from Last 3 Encounters:  05/11/20 166 lb (75.3 kg)  04/13/20 162 lb 14.7 oz (73.9 kg)  04/06/20 163 lb (73.9 kg)   BP Readings from Last 3 Encounters:  05/11/20 (!) 148/82  04/14/20 (!) 160/85  04/06/20 (!) 131/91    Past Medical History:  Diagnosis Date  . Anemia   . Chronic venous insufficiency    LE's  . Depression    pt denies  . Diabetes mellitus without complication (HCC)    no meds  pt. denies states she is pre diabetic   . Fracture of shaft of right radius 01/06/2013  . History of colonic polyps    multiple of succeeding years 96, 97, 98, and 99  . Hyperlipidemia   . Hypertension   . Hyperthyroidism    s/p rad Iodine-ellison  . Hypothyroidism 01/19/2015  . Leg length discrepancy    right leg  longer  . Low back pain   . Lymphedema    left arm, both legs; primary lymphedema  . Lymphedema    legs  . Neuromuscular disorder (HCC)    sciatiaca  . Osteoporosis   . Sacroiliitis (HCC)    history of   Past Surgical History:  Procedure Laterality Date  . [place]OSCOPY  2008[place]in [place]  . EYE SURGERY     both eyes-muscle repair  cataract right eye  . OPEN REDUCTION INTERNAL FIXATION (ORIF) DISTAL RADIAL FRACTURE Right 01/06/2013   Procedure: RIGHT OPEN REDUCTION INTERNAL FIXATION (ORIF) DISTAL ARTICULATING RADIAL FRACTURE ;  Surgeon: Eulas Post, MD;  Location: [facility];  Service: Orthopedics;  Laterality: Right;  . 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 REVISION OF SCAR TISSUE RECTUS MUSCLE  11/08   right knee  . TOTAL KNEE REVISION Right 04/13/2020   Procedure: Right knee polyethylene revision.;  Surgeon: Ollen Gross, MD;  Location: [facility];  Service: Orthopedics;  Laterality: Right;   . TUBAL LIGATION      reports that she has never smoked. She has never used smokeless tobacco. She reports that she does not drink alcohol and does not use drugs. family history  includes Cancer in her mother; Colon cancer (age of onset: 24) in her mother; Diabetes in her mother and another family member; Heart disease in her maternal grandmother; Kidney disease in an other family member; Stroke in an other family member. Allergies  Allergen Reactions  . Ace Inhibitors Cough  . Lisinopril Other (See Comments)    Cough   Current Outpatient Medications on File Prior to Visit  Medication Sig Dispense Refill  . alendronate (FOSAMAX) 70 MG tablet Take 1 tablet (70 mg total) by mouth every 7 (seven) days. Take with a full glass of water on an empty stomach. 12 tablet 3  . ALPHAGAN P 0.1 % SOLN Place 1 drop into both eyes in the morning and at bedtime.   11  . amoxicillin (AMOXIL) 500 MG capsule Take 1,000 mg by mouth See admin instructions. Take 2 capsules by mouth 1 hr  prior to dental procedure, then 2 capsules 4 hrs after  1  . ascorbic acid (VITAMIN C) 500 MG tablet Take 500 mg by mouth daily.    Marland Kitchen atorvastatin (LIPITOR) 10 MG tablet Take 1 tablet (10 mg total) by mouth daily. 90 tablet 3  . Biotin 5000 MCG CAPS Take 5,000 mcg by mouth daily.    . cholecalciferol (VITAMIN D3) 25 MCG (1000 UNIT) tablet Take 1,000 Units by mouth daily.    . diclofenac Sodium (VOLTAREN) 1 % GEL Apply 2 g topically 4 (four) times daily as needed (pain).     Marland Kitchen diltiazem (CARDIZEM CD) 360 MG 24 hr capsule TAKE 1 CAPSULE BY MOUTH EVERY DAY (Patient taking differently: Take 360 mg by mouth daily. TAKE 1 CAPSULE BY MOUTH EVERY DAY) 90 capsule 1  . dorzolamide-timolol (COSOPT) 22.3-6.8 MG/ML ophthalmic solution Place 1 drop into both eyes 2 (two) times daily.     . DUREZOL 0.05 % EMUL Place 1 drop into the right eye every other day.     . fish oil-omega-3 fatty acids 1000 MG capsule Take 1 g by mouth daily.    . furosemide (LASIX) 40 MG tablet Take 1 tablet (40 mg total) by mouth daily as needed for edema. 90 tablet 3  . guanFACINE (TENEX) 2 MG tablet Take 1 tablet (2 mg total) by mouth at bedtime. 90 tablet 3  . hydrALAZINE (APRESOLINE) 50 MG tablet Take 1 tablet (50 mg total) by mouth 3 (three) times daily. 90 tablet 11  . HYDROmorphone (DILAUDID) 2 MG tablet Take 1-2 tablets (2-4 mg total) by mouth every 6 (six) hours as needed for severe pain. 42 tablet 0  . irbesartan (AVAPRO) 300 MG tablet TAKE 1 TABLET BY MOUTH EVERY DAY (Patient taking differently: Take 300 mg by mouth daily.) 90 tablet 1  . latanoprost (XALATAN) 0.005 % ophthalmic solution Place 1 drop into both eyes at bedtime.  4  . methocarbamol (ROBAXIN) 500 MG tablet Take 1 tablet (500 mg total) by mouth every 6 (six) hours as needed for muscle spasms. 40 tablet 0  . Multiple Vitamin (MULTIVITAMIN) capsule Take 1 capsule by mouth daily.    . potassium chloride (KLOR-CON M10) 10 MEQ tablet Take 1 tablet (10 mEq total) by  mouth daily. 90 tablet 3  . Probiotic Product (PHILLIPS COLON HEALTH PO) Take 1 capsule by mouth daily.      No current facility-administered medications on file prior to visit.        ROS:  All others reviewed and negative.  Objective        PE:  BP (!) 148/82   Pulse 68   Temp 98 F (36.7 C) (Oral)   Ht 5\' 8"  (1.727 m)   Wt 166 lb (75.3 kg)   SpO2 99%   BMI 25.24 kg/m                 Constitutional: Pt appears in NAD               HENT: Head: NCAT.                Right Ear: External ear normal.                 Left Ear: External ear normal.                Eyes: . Pupils are equal, round, and reactive to light. Conjunctivae and EOM are normal               Nose: without d/c or deformity               Neck: Neck supple. Gross normal ROM               Cardiovascular: Normal rate and regular rhythm.                 Pulmonary/Chest: Effort normal and breath sounds without rales or wheezing.                Abd:  Soft, NT, ND, + BS, no organomegaly               Neurological: Pt is alert. At baseline orientation, motor grossly intact               Skin: Skin is warm. No rashes, no other new lesions, LE edema - large RLE lymphedema               Psychiatric: Pt behavior is normal without agitation   Assessment/Plan:  JAZZMON PRINDLE is a 65 y.o. Black or African American [2] female with  has a past medical history of Anemia, Chronic venous insufficiency, Depression, Diabetes mellitus without complication (HCC), Fracture of shaft of right radius (01/06/2013), History of colonic polyps, Hyperlipidemia, Hypertension, Hyperthyroidism, Hypothyroidism (01/19/2015), Leg length discrepancy, Low back pain, Lymphedema, Lymphedema, Neuromuscular disorder (HCC), Osteoporosis, and Sacroiliitis (HCC).   Assessment Plan  See problem oriented assessment and plan Labs/data reviewed for each problem:  Micro: none  Cardiac tracings I have personally interpreted today:  none  Pertinent Radiological  findings (summarize): none    Health Maintenance Due  Topic Date Due  . MAMMOGRAM  01/21/2020    There are no preventive care reminders to display for this patient.  Lab Results  Component Value Date   TSH 7.03 (H) 05/10/2020   Lab Results  Component Value Date   WBC 5.6 05/10/2020   HGB 12.3 05/10/2020   HCT 36.1 05/10/2020   MCV 86.0 05/10/2020   PLT 331.0 05/10/2020   Lab Results  Component Value Date   NA 137 05/10/2020   K 4.5 05/10/2020   CO2 28 05/10/2020   GLUCOSE 102 (H) 05/10/2020   BUN 20 05/10/2020   CREATININE 0.89 05/10/2020   BILITOT 0.4 05/10/2020   ALKPHOS 64 05/10/2020   AST 20 05/10/2020   ALT 24 05/10/2020   PROT 6.6 05/10/2020   ALBUMIN 3.8 05/10/2020   CALCIUM 9.3 05/10/2020   ANIONGAP 11 04/14/2020   GFR 67.50 05/10/2020   Lab Results  Component Value Date  CHOL 169 05/10/2020   Lab Results  Component Value Date   HDL 77.50 05/10/2020   Lab Results  Component Value Date   LDLCALC 79 05/10/2020   Lab Results  Component Value Date   TRIG 62.0 05/10/2020   Lab Results  Component Value Date   CHOLHDL 2 05/10/2020   Lab Results  Component Value Date   HGBA1C 5.9 (H) 04/06/2020      Assessment & Plan:   Problem List Items Addressed This Visit      Medium   Lymphedema    RLE - stable, cont compressions stockings      Hypothyroidism    Lab Results  Component Value Date   TSH 7.03 (H) 05/10/2020   Stable, pt to continue levothyroxine but increased dose 100 mcg qd  Current Outpatient Medications (Endocrine & Metabolic):  .  alendronate (FOSAMAX) 70 MG tablet, Take 1 tablet (70 mg total) by mouth every 7 (seven) days. Take with a full glass of water on an empty stomach. .  levothyroxine (SYNTHROID) 100 MCG tablet, Take 1 tablet (100 mcg total) by mouth daily.  Current Outpatient Medications (Cardiovascular):  .  atorvastatin (LIPITOR) 10 MG tablet, Take 1 tablet (10 mg total) by mouth daily. Marland Kitchen  diltiazem (CARDIZEM  CD) 360 MG 24 hr capsule, TAKE 1 CAPSULE BY MOUTH EVERY DAY (Patient taking differently: Take 360 mg by mouth daily. TAKE 1 CAPSULE BY MOUTH EVERY DAY) .  furosemide (LASIX) 40 MG tablet, Take 1 tablet (40 mg total) by mouth daily as needed for edema. Marland Kitchen  guanFACINE (TENEX) 2 MG tablet, Take 1 tablet (2 mg total) by mouth at bedtime. .  hydrALAZINE (APRESOLINE) 50 MG tablet, Take 1 tablet (50 mg total) by mouth 3 (three) times daily. .  irbesartan (AVAPRO) 300 MG tablet, TAKE 1 TABLET BY MOUTH EVERY DAY (Patient taking differently: Take 300 mg by mouth daily.)   Current Outpatient Medications (Analgesics):  .  HYDROmorphone (DILAUDID) 2 MG tablet, Take 1-2 tablets (2-4 mg total) by mouth every 6 (six) hours as needed for severe pain. .  traMADol (ULTRAM-ER) 200 MG 24 hr tablet, Take 1 tablet (200 mg total) by mouth daily.   Current Outpatient Medications (Other):  Marland Kitchen  ALPHAGAN P 0.1 % SOLN, Place 1 drop into both eyes in the morning and at bedtime.  Marland Kitchen  amoxicillin (AMOXIL) 500 MG capsule, Take 1,000 mg by mouth See admin instructions. Take 2 capsules by mouth 1 hr prior to dental procedure, then 2 capsules 4 hrs after .  ascorbic acid (VITAMIN C) 500 MG tablet, Take 500 mg by mouth daily. .  Biotin 5000 MCG CAPS, Take 5,000 mcg by mouth daily. .  cholecalciferol (VITAMIN D3) 25 MCG (1000 UNIT) tablet, Take 1,000 Units by mouth daily. .  diclofenac Sodium (VOLTAREN) 1 % GEL, Apply 2 g topically 4 (four) times daily as needed (pain).  .  dorzolamide-timolol (COSOPT) 22.3-6.8 MG/ML ophthalmic solution, Place 1 drop into both eyes 2 (two) times daily.  .  DUREZOL 0.05 % EMUL, Place 1 drop into the right eye every other day.  .  fish oil-omega-3 fatty acids 1000 MG capsule, Take 1 g by mouth daily. Marland Kitchen  latanoprost (XALATAN) 0.005 % ophthalmic solution, Place 1 drop into both eyes at bedtime. .  methocarbamol (ROBAXIN) 500 MG tablet, Take 1 tablet (500 mg total) by mouth every 6 (six) hours as needed  for muscle spasms. .  metroNIDAZOLE (FLAGYL) 250 MG tablet, Take 1 tablet (250  mg total) by mouth 3 (three) times daily for 7 days. .  Multiple Vitamin (MULTIVITAMIN) capsule, Take 1 capsule by mouth daily. .  potassium chloride (KLOR-CON M10) 10 MEQ tablet, Take 1 tablet (10 mEq total) by mouth daily. .  Probiotic Product (PHILLIPS COLON HEALTH PO), Take 1 capsule by mouth daily.       Relevant Medications   levothyroxine (SYNTHROID) 100 MCG tablet   Hyperlipidemia    Lab Results  Component Value Date   LDLCALC 79 05/10/2020   Stable, pt to continue current statin  - lipitor 10 mg       Hyperglycemia    Lab Results  Component Value Date   HGBA1C 5.9 (H) 04/06/2020   Stable, pt to continue current medical treatment - diet      Essential hypertension    BP Readings from Last 3 Encounters:  05/11/20 (!) 148/82  04/14/20 (!) 160/85  04/06/20 (!) 131/91   Stable, pt to continue medical treatment - mild elevated today, states bp at home per pt, will continue cardizem, apresoline, avapro   Current Outpatient Medications (Endocrine & Metabolic):  .  alendronate (FOSAMAX) 70 MG tablet, Take 1 tablet (70 mg total) by mouth every 7 (seven) days. Take with a full glass of water on an empty stomach. .  levothyroxine (SYNTHROID) 100 MCG tablet, Take 1 tablet (100 mcg total) by mouth daily.  Current Outpatient Medications (Cardiovascular):  .  atorvastatin (LIPITOR) 10 MG tablet, Take 1 tablet (10 mg total) by mouth daily. Marland Kitchen.  diltiazem (CARDIZEM CD) 360 MG 24 hr capsule, TAKE 1 CAPSULE BY MOUTH EVERY DAY (Patient taking differently: Take 360 mg by mouth daily. TAKE 1 CAPSULE BY MOUTH EVERY DAY) .  furosemide (LASIX) 40 MG tablet, Take 1 tablet (40 mg total) by mouth daily as needed for edema. Marland Kitchen.  guanFACINE (TENEX) 2 MG tablet, Take 1 tablet (2 mg total) by mouth at bedtime. .  hydrALAZINE (APRESOLINE) 50 MG tablet, Take 1 tablet (50 mg total) by mouth 3 (three) times daily. .   irbesartan (AVAPRO) 300 MG tablet, TAKE 1 TABLET BY MOUTH EVERY DAY (Patient taking differently: Take 300 mg by mouth daily.)   Current Outpatient Medications (Analgesics):  .  HYDROmorphone (DILAUDID) 2 MG tablet, Take 1-2 tablets (2-4 mg total) by mouth every 6 (six) hours as needed for severe pain. .  traMADol (ULTRAM-ER) 200 MG 24 hr tablet, Take 1 tablet (200 mg total) by mouth daily.   Current Outpatient Medications (Other):  Marland Kitchen.  ALPHAGAN P 0.1 % SOLN, Place 1 drop into both eyes in the morning and at bedtime.  Marland Kitchen.  amoxicillin (AMOXIL) 500 MG capsule, Take 1,000 mg by mouth See admin instructions. Take 2 capsules by mouth 1 hr prior to dental procedure, then 2 capsules 4 hrs after .  ascorbic acid (VITAMIN C) 500 MG tablet, Take 500 mg by mouth daily. .  Biotin 5000 MCG CAPS, Take 5,000 mcg by mouth daily. .  cholecalciferol (VITAMIN D3) 25 MCG (1000 UNIT) tablet, Take 1,000 Units by mouth daily. .  diclofenac Sodium (VOLTAREN) 1 % GEL, Apply 2 g topically 4 (four) times daily as needed (pain).  .  dorzolamide-timolol (COSOPT) 22.3-6.8 MG/ML ophthalmic solution, Place 1 drop into both eyes 2 (two) times daily.  .  DUREZOL 0.05 % EMUL, Place 1 drop into the right eye every other day.  .  fish oil-omega-3 fatty acids 1000 MG capsule, Take 1 g by mouth daily. Marland Kitchen.  latanoprost (XALATAN)  0.005 % ophthalmic solution, Place 1 drop into both eyes at bedtime. .  methocarbamol (ROBAXIN) 500 MG tablet, Take 1 tablet (500 mg total) by mouth every 6 (six) hours as needed for muscle spasms. .  metroNIDAZOLE (FLAGYL) 250 MG tablet, Take 1 tablet (250 mg total) by mouth 3 (three) times daily for 7 days. .  Multiple Vitamin (MULTIVITAMIN) capsule, Take 1 capsule by mouth daily. .  potassium chloride (KLOR-CON M10) 10 MEQ tablet, Take 1 tablet (10 mEq total) by mouth daily. .  Probiotic Product (PHILLIPS COLON HEALTH PO), Take 1 capsule by mouth daily.          Low   Bacterial vaginosis    Incidental,  for flagyl asd,  to f/u any worsening symptoms or concerns      Relevant Medications   metroNIDAZOLE (FLAGYL) 250 MG tablet      Meds ordered this encounter  Medications  . traMADol (ULTRAM-ER) 200 MG 24 hr tablet    Sig: Take 1 tablet (200 mg total) by mouth daily.    Dispense:  90 tablet    Refill:  1    This request is for a new prescription for a controlled substance as required by Federal/State law.  . metroNIDAZOLE (FLAGYL) 250 MG tablet    Sig: Take 1 tablet (250 mg total) by mouth 3 (three) times daily for 7 days.    Dispense:  21 tablet    Refill:  0  . levothyroxine (SYNTHROID) 100 MCG tablet    Sig: Take 1 tablet (100 mcg total) by mouth daily.    Dispense:  90 tablet    Refill:  3    Follow-up: Return in about 6 months (around 11/08/2020).    Oliver BarreJames Pauline Trainer, MD 05/11/2020 9:07 PM [facility]

## 2020-05-11 NOTE — Assessment & Plan Note (Signed)
BP Readings from Last 3 Encounters:  05/11/20 (!) 148/82  04/14/20 (!) 160/85  04/06/20 (!) 131/91   Stable, pt to continue medical treatment - mild elevated today, states bp at home per pt, will continue cardizem, apresoline, avapro   Current Outpatient Medications (Endocrine & Metabolic):  .  alendronate (FOSAMAX) 70 MG tablet, Take 1 tablet (70 mg total) by mouth every 7 (seven) days. Take with a full glass of water on an empty stomach. .  levothyroxine (SYNTHROID) 100 MCG tablet, Take 1 tablet (100 mcg total) by mouth daily.  Current Outpatient Medications (Cardiovascular):  .  atorvastatin (LIPITOR) 10 MG tablet, Take 1 tablet (10 mg total) by mouth daily. Marland Kitchen  diltiazem (CARDIZEM CD) 360 MG 24 hr capsule, TAKE 1 CAPSULE BY MOUTH EVERY DAY (Patient taking differently: Take 360 mg by mouth daily. TAKE 1 CAPSULE BY MOUTH EVERY DAY) .  furosemide (LASIX) 40 MG tablet, Take 1 tablet (40 mg total) by mouth daily as needed for edema. Marland Kitchen  guanFACINE (TENEX) 2 MG tablet, Take 1 tablet (2 mg total) by mouth at bedtime. .  hydrALAZINE (APRESOLINE) 50 MG tablet, Take 1 tablet (50 mg total) by mouth 3 (three) times daily. .  irbesartan (AVAPRO) 300 MG tablet, TAKE 1 TABLET BY MOUTH EVERY DAY (Patient taking differently: Take 300 mg by mouth daily.)   Current Outpatient Medications (Analgesics):  .  HYDROmorphone (DILAUDID) 2 MG tablet, Take 1-2 tablets (2-4 mg total) by mouth every 6 (six) hours as needed for severe pain. .  traMADol (ULTRAM-ER) 200 MG 24 hr tablet, Take 1 tablet (200 mg total) by mouth daily.   Current Outpatient Medications (Other):  Marland Kitchen  ALPHAGAN P 0.1 % SOLN, Place 1 drop into both eyes in the morning and at bedtime.  Marland Kitchen  amoxicillin (AMOXIL) 500 MG capsule, Take 1,000 mg by mouth See admin instructions. Take 2 capsules by mouth 1 hr prior to dental procedure, then 2 capsules 4 hrs after .  ascorbic acid (VITAMIN C) 500 MG tablet, Take 500 mg by mouth daily. .  Biotin 5000 MCG  CAPS, Take 5,000 mcg by mouth daily. .  cholecalciferol (VITAMIN D3) 25 MCG (1000 UNIT) tablet, Take 1,000 Units by mouth daily. .  diclofenac Sodium (VOLTAREN) 1 % GEL, Apply 2 g topically 4 (four) times daily as needed (pain).  .  dorzolamide-timolol (COSOPT) 22.3-6.8 MG/ML ophthalmic solution, Place 1 drop into both eyes 2 (two) times daily.  .  DUREZOL 0.05 % EMUL, Place 1 drop into the right eye every other day.  .  fish oil-omega-3 fatty acids 1000 MG capsule, Take 1 g by mouth daily. Marland Kitchen  latanoprost (XALATAN) 0.005 % ophthalmic solution, Place 1 drop into both eyes at bedtime. .  methocarbamol (ROBAXIN) 500 MG tablet, Take 1 tablet (500 mg total) by mouth every 6 (six) hours as needed for muscle spasms. .  metroNIDAZOLE (FLAGYL) 250 MG tablet, Take 1 tablet (250 mg total) by mouth 3 (three) times daily for 7 days. .  Multiple Vitamin (MULTIVITAMIN) capsule, Take 1 capsule by mouth daily. .  potassium chloride (KLOR-CON M10) 10 MEQ tablet, Take 1 tablet (10 mEq total) by mouth daily. .  Probiotic Product (PHILLIPS COLON HEALTH PO), Take 1 capsule by mouth daily.

## 2020-05-12 ENCOUNTER — Other Ambulatory Visit: Payer: Self-pay | Admitting: Internal Medicine

## 2020-05-12 ENCOUNTER — Encounter: Payer: Self-pay | Admitting: Internal Medicine

## 2020-05-12 NOTE — Telephone Encounter (Signed)
Please refill as per office routine med refill policy (all routine meds refilled for 3 mo or monthly per pt preference up to one year from last visit, then month to month grace period for 3 mo, then further med refills will have to be denied)  

## 2020-05-13 ENCOUNTER — Other Ambulatory Visit: Payer: Self-pay | Admitting: Internal Medicine

## 2020-05-13 MED ORDER — TRAMADOL HCL 50 MG PO TABS: 50.0000 mg | ORAL_TABLET | Freq: Four times a day (QID) | ORAL | 5 refills | Status: DC | PRN

## 2020-05-13 NOTE — Telephone Encounter (Signed)
Patient inform.

## 2020-05-13 NOTE — Telephone Encounter (Signed)
Ok to let pt know  Tramadol ER was denied by insurance  I sent regular tramadol instead to [facility]

## 2020-05-20 ENCOUNTER — Encounter: Payer: Self-pay | Admitting: Internal Medicine

## 2020-05-20 ENCOUNTER — Telehealth: Payer: Self-pay

## 2020-05-20 DIAGNOSIS — I890 Lymphedema, not elsewhere classified: Secondary | ICD-10-CM

## 2020-05-20 NOTE — Telephone Encounter (Signed)
Tramadol ER----- Prior auth. Key is [id number]

## 2020-05-20 NOTE — Telephone Encounter (Signed)
Pt sent a my chart message about the PA for Tramadol ER being denied.

## 2020-05-23 ENCOUNTER — Telehealth: Payer: Self-pay

## 2020-05-24 NOTE — Telephone Encounter (Signed)
Done hardcopy to [facility]

## 2020-05-25 ENCOUNTER — Telehealth: Payer: Self-pay

## 2020-05-25 NOTE — Telephone Encounter (Signed)
Notified pt compression stockings gorder is available for pick up.

## 2020-05-26 ENCOUNTER — Encounter: Payer: Self-pay | Admitting: Internal Medicine

## 2020-05-27 NOTE — Telephone Encounter (Signed)
Ok to appeal the denial if possible using this pt information,  thanks

## 2020-05-31 ENCOUNTER — Telehealth: Payer: Self-pay

## 2020-05-31 NOTE — Telephone Encounter (Signed)
New PA started key: [id number]  For Tramadol U ER 200mg 

## 2020-07-13 DIAGNOSIS — M48061 Spinal stenosis, lumbar region without neurogenic claudication: Secondary | ICD-10-CM | POA: Insufficient documentation

## 2020-08-11 ENCOUNTER — Other Ambulatory Visit: Payer: Self-pay | Admitting: Internal Medicine

## 2020-08-11 NOTE — Telephone Encounter (Signed)
Please refill as per office routine med refill policy (all routine meds refilled for 3 mo or monthly per pt preference up to one year from last visit, then month to month grace period for 3 mo, then further med refills will have to be denied)  

## 2020-09-27 ENCOUNTER — Other Ambulatory Visit: Payer: Self-pay | Admitting: Internal Medicine

## 2020-10-10 ENCOUNTER — Encounter: Payer: Self-pay | Admitting: Internal Medicine

## 2020-10-10 ENCOUNTER — Other Ambulatory Visit: Payer: Self-pay | Admitting: Internal Medicine

## 2020-10-11 MED ORDER — TRAMADOL HCL ER 200 MG PO TB24: 200.0000 mg | ORAL_TABLET | Freq: Every day | ORAL | 0 refills | Status: DC

## 2020-10-17 NOTE — Telephone Encounter (Signed)
done

## 2020-10-18 ENCOUNTER — Encounter: Payer: BC Managed Care – PPO | Admitting: Internal Medicine

## 2020-10-21 ENCOUNTER — Other Ambulatory Visit (INDEPENDENT_AMBULATORY_CARE_PROVIDER_SITE_OTHER): Payer: BC Managed Care – PPO

## 2020-10-21 ENCOUNTER — Telehealth: Payer: Self-pay | Admitting: Internal Medicine

## 2020-10-21 ENCOUNTER — Other Ambulatory Visit: Payer: Self-pay | Admitting: Internal Medicine

## 2020-10-21 DIAGNOSIS — E7849 Other hyperlipidemia: Secondary | ICD-10-CM | POA: Diagnosis not present

## 2020-10-21 DIAGNOSIS — E538 Deficiency of other specified B group vitamins: Secondary | ICD-10-CM

## 2020-10-21 DIAGNOSIS — R739 Hyperglycemia, unspecified: Secondary | ICD-10-CM | POA: Diagnosis not present

## 2020-10-21 DIAGNOSIS — E559 Vitamin D deficiency, unspecified: Secondary | ICD-10-CM | POA: Diagnosis not present

## 2020-10-21 DIAGNOSIS — E039 Hypothyroidism, unspecified: Secondary | ICD-10-CM | POA: Diagnosis not present

## 2020-10-21 LAB — TSH: TSH: 0.93 u[IU]/mL (ref 0.35–5.50)

## 2020-10-21 LAB — CBC WITH DIFFERENTIAL/PLATELET
Basophils Absolute: 0.1 10*3/uL (ref 0.0–0.1)
Basophils Relative: 1.1 % (ref 0.0–3.0)
Eosinophils Absolute: 0.1 10*3/uL (ref 0.0–0.7)
Eosinophils Relative: 2 % (ref 0.0–5.0)
HCT: 37.4 % (ref 36.0–46.0)
Hemoglobin: 13.1 g/dL (ref 12.0–15.0)
Lymphocytes Relative: 40.1 % (ref 12.0–46.0)
Lymphs Abs: 2.5 10*3/uL (ref 0.7–4.0)
MCHC: 35 g/dL (ref 30.0–36.0)
MCV: 84.5 fl (ref 78.0–100.0)
Monocytes Absolute: 0.4 10*3/uL (ref 0.1–1.0)
Monocytes Relative: 6.7 % (ref 3.0–12.0)
Neutro Abs: 3.1 10*3/uL (ref 1.4–7.7)
Neutrophils Relative %: 50.1 % (ref 43.0–77.0)
Platelets: 213 10*3/uL (ref 150.0–400.0)
RBC: 4.42 Mil/uL (ref 3.87–5.11)
RDW: 14.8 % (ref 11.5–15.5)
WBC: 6.1 10*3/uL (ref 4.0–10.5)

## 2020-10-21 LAB — LIPID PANEL
Cholesterol: 171 mg/dL (ref 0–200)
HDL: 79.2 mg/dL (ref >39.00)
LDL Cholesterol: 83 mg/dL (ref 0–99)
NonHDL: 91.5
Total CHOL/HDL Ratio: 2
Triglycerides: 45 mg/dL (ref 0.0–149.0)
VLDL: 9 mg/dL (ref 0.0–40.0)

## 2020-10-21 LAB — VITAMIN D 25 HYDROXY (VIT D DEFICIENCY, FRACTURES): VITD: 70.27 ng/mL (ref 30.00–100.00)

## 2020-10-21 LAB — URINALYSIS, ROUTINE W REFLEX MICROSCOPIC
Bilirubin Urine: NEGATIVE
Hgb urine dipstick: NEGATIVE
Ketones, ur: NEGATIVE
Leukocytes,Ua: NEGATIVE
Nitrite: NEGATIVE
Specific Gravity, Urine: 1.02 (ref 1.000–1.030)
Total Protein, Urine: NEGATIVE
Urine Glucose: NEGATIVE
Urobilinogen, UA: 0.2 (ref 0.0–1.0)
pH: 6.5 (ref 5.0–8.0)

## 2020-10-21 LAB — BASIC METABOLIC PANEL
BUN: 18 mg/dL (ref 6–23)
CO2: 29 mEq/L (ref 19–32)
Calcium: 9.5 mg/dL (ref 8.4–10.5)
Chloride: 105 mEq/L (ref 96–112)
Creatinine, Ser: 0.9 mg/dL (ref 0.40–1.20)
GFR: 66.39 mL/min (ref >60.00)
Glucose, Bld: 110 mg/dL — ABNORMAL HIGH (ref 70–99)
Potassium: 3.8 mEq/L (ref 3.5–5.1)
Sodium: 140 mEq/L (ref 135–145)

## 2020-10-21 LAB — HEPATIC FUNCTION PANEL
ALT: 11 U/L (ref 0–35)
AST: 15 U/L (ref 0–37)
Albumin: 3.9 g/dL (ref 3.5–5.2)
Alkaline Phosphatase: 74 U/L (ref 39–117)
Bilirubin, Direct: 0.1 mg/dL (ref 0.0–0.3)
Total Bilirubin: 0.5 mg/dL (ref 0.2–1.2)
Total Protein: 7.2 g/dL (ref 6.0–8.3)

## 2020-10-21 LAB — VITAMIN B12: Vitamin B-12: 524 pg/mL (ref 211–911)

## 2020-10-21 LAB — HEMOGLOBIN A1C: Hgb A1c MFr Bld: 5.8 % (ref 4.6–6.5)

## 2020-10-21 LAB — T4, FREE: Free T4: 2.59 ng/dL — ABNORMAL HIGH (ref 0.60–1.60)

## 2020-10-21 NOTE — Telephone Encounter (Signed)
   Patient is requesting labs for her 7/5 appointment. She said that she is at the [facility] now. Please advise

## 2020-10-25 ENCOUNTER — Ambulatory Visit: Payer: BC Managed Care – PPO | Admitting: Internal Medicine

## 2020-10-25 ENCOUNTER — Encounter: Payer: Self-pay | Admitting: Internal Medicine

## 2020-10-25 ENCOUNTER — Other Ambulatory Visit: Payer: Self-pay

## 2020-10-25 VITALS — BP 142/78 | HR 63 | Temp 98.8°F | Ht 68.0 in | Wt 164.8 lb

## 2020-10-25 DIAGNOSIS — E119 Type 2 diabetes mellitus without complications: Secondary | ICD-10-CM

## 2020-10-25 DIAGNOSIS — R739 Hyperglycemia, unspecified: Secondary | ICD-10-CM

## 2020-10-25 DIAGNOSIS — Z0001 Encounter for general adult medical examination with abnormal findings: Secondary | ICD-10-CM

## 2020-10-25 DIAGNOSIS — M25561 Pain in right knee: Secondary | ICD-10-CM | POA: Diagnosis not present

## 2020-10-25 DIAGNOSIS — I10 Essential (primary) hypertension: Secondary | ICD-10-CM

## 2020-10-25 DIAGNOSIS — E039 Hypothyroidism, unspecified: Secondary | ICD-10-CM

## 2020-10-25 DIAGNOSIS — E7800 Pure hypercholesterolemia, unspecified: Secondary | ICD-10-CM | POA: Diagnosis not present

## 2020-10-25 NOTE — Progress Notes (Signed)
Patient ID: Penny Yates, female   DOB: 07/18/1955, 65 y.o.   MRN: [id number]         Chief Complaint:: wellness exam and Follow-up  Chronic pain, htn, dm, hypothyoridism       HPI:  Penny BeardsDeborah S Yates is a 65 y.o. female here for wellness exam; up to date with preventive referrals and immunizations                        Also working from home and plans to continue.  Pt denies chest pain, increased sob or doe, wheezing, orthopnea, PND, increased LE swelling, palpitations, dizziness or syncope.   Pt denies polydipsia, polyuria, or new focal neuro s/s.  Denies hyper or hypo thyroid symptoms such as voice, skin or hair change.  Asks for decreased tramadol Er 100 qd as 200 not any better to her.   Pt denies fever, wt loss, night sweats, loss of appetite, or other constitutional symptoms  No other new compalints   BP has been < 140/90 at home but not check recently   Wt Readings from Last 3 Encounters:  10/25/20 164 lb 12.8 oz (74.8 kg)  05/11/20 166 lb (75.3 kg)  04/13/20 162 lb 14.7 oz (73.9 kg)   BP Readings from Last 3 Encounters:  10/25/20 (!) 142/78  05/11/20 (!) 148/82  04/14/20 (!) 160/85   Immunization History  Administered Date(s) Administered   Fluad Quad(high Dose 65+) 12/20/2018   Influenza Inj Mdck Quad Pf 01/28/2018   Influenza Split 01/08/2011, 02/12/2012   Influenza Whole 02/18/2009   Influenza, High Dose Seasonal PF 02/11/2020   Influenza,inj,Quad PF,6+ Mos 02/19/2013, 07/06/2015, 03/09/2016, 03/09/2016, 12/18/2016   Influenza-Unspecified 12/18/2018   PFIZER(Purple Top)SARS-COV-2 Vaccination 05/28/2019, 06/22/2019, 01/21/2020   Pneumococcal Conjugate-13 07/02/2016   Pneumococcal Polysaccharide-23 10/15/2019   Td 12/22/2004   Tdap 06/26/2016   Zoster Recombinat (Shingrix) 06/29/2017, 11/10/2017   There are no preventive care reminders to display for this patient.     Past Medical History:  Diagnosis Date   Anemia    Chronic venous insufficiency    LE's    Depression    pt denies   Diabetes mellitus without complication (HCC)    no meds  pt. denies states she is pre diabetic    Fracture of shaft of right radius 01/06/2013   History of colonic polyps    multiple of succeeding years 96, 97, 98, and 99   Hyperlipidemia    Hypertension    Hyperthyroidism    s/p rad Iodine-ellison   Hypothyroidism 01/19/2015   Leg length discrepancy    right leg longer   Low back pain    Lymphedema    left arm, both legs; primary lymphedema   Lymphedema    legs   Neuromuscular disorder (HCC)    sciatiaca   Osteoporosis    Sacroiliitis (HCC)    history of   Past Surgical History:  Procedure Laterality Date   [place]   EYE SURGERY     both eyes-muscle repair  cataract right eye   OPEN REDUCTION INTERNAL FIXATION (ORIF) DISTAL RADIAL FRACTURE Right 01/06/2013   Procedure: RIGHT OPEN REDUCTION INTERNAL FIXATION (ORIF) DISTAL ARTICULATING RADIAL FRACTURE ;  Surgeon: Eulas PostJoshua P Landau, MD;  Location: [facility];  Service: Orthopedics;  Laterality: Right;   REPLACEMENT TOTAL KNEE  june 2007   right   REVISION OF SCAR TISSUE RECTUS MUSCLE  11/08   right knee  TOTAL KNEE REVISION Right 04/13/2020   Procedure: Right knee polyethylene revision.;  Surgeon: Ollen Gross, MD;  Location: [facility];  Service: Orthopedics;  Laterality: Right;    TUBAL LIGATION      reports that she has never smoked. She has never used smokeless tobacco. She reports that she does not drink alcohol and does not use drugs. family history includes Cancer in her mother; Colon cancer (age of onset: 19) in her mother; Diabetes in her mother and another family member; Heart disease in her maternal grandmother; Kidney disease in an other family member; Stroke in an other family member. Allergies  Allergen Reactions   Ace Inhibitors Cough   Lisinopril Other (See Comments)    Cough   Current Outpatient Medications on File Prior to Visit  Medication Sig  Dispense Refill   ALPHAGAN P 0.1 % SOLN Place 1 drop into both eyes in the morning and at bedtime.   11   amoxicillin (AMOXIL) 500 MG capsule Take 1,000 mg by mouth See admin instructions. Take 2 capsules by mouth 1 hr prior to dental procedure, then 2 capsules 4 hrs after  1   ascorbic acid (VITAMIN C) 500 MG tablet Take 500 mg by mouth daily.     Biotin 5000 MCG CAPS Take 5,000 mcg by mouth daily.     cholecalciferol (VITAMIN D3) 25 MCG (1000 UNIT) tablet Take 1,000 Units by mouth daily.     diclofenac Sodium (VOLTAREN) 1 % GEL Apply 2 g topically 4 (four) times daily as needed (pain).      dorzolamide-timolol (COSOPT) 22.3-6.8 MG/ML ophthalmic solution Place 1 drop into both eyes 2 (two) times daily.      DUREZOL 0.05 % EMUL Place 1 drop into the right eye every other day.      fish oil-omega-3 fatty acids 1000 MG capsule Take 1 g by mouth daily.     latanoprost (XALATAN) 0.005 % ophthalmic solution Place 1 drop into both eyes at bedtime.  4   Multiple Vitamin (MULTIVITAMIN) capsule Take 1 capsule by mouth daily.     Probiotic Product (PHILLIPS COLON HEALTH PO) Take 1 capsule by mouth daily.      No current facility-administered medications on file prior to visit.        ROS:  All others reviewed and negative.  Objective        PE:  BP (!) 142/78 (BP Location: Right Arm, Patient Position: Sitting, Cuff Size: Normal)   Pulse 63   Temp 98.8 F (37.1 C) (Oral)   Ht 5\' 8"  (1.727 m)   Wt 164 lb 12.8 oz (74.8 kg)   SpO2 98%   BMI 25.06 kg/m                 Constitutional: Pt appears in NAD               HENT: Head: NCAT.                Right Ear: External ear normal.                 Left Ear: External ear normal.                Eyes: . Pupils are equal, round, and reactive to light. Conjunctivae and EOM are normal               Nose: without d/c or deformity  Neck: Neck supple. Gross normal ROM               Cardiovascular: Normal rate and regular rhythm.                  Pulmonary/Chest: Effort normal and breath sounds without rales or wheezing.                Abd:  Soft, NT, ND, + BS, no organomegaly               Neurological: Pt is alert. At baseline orientation, motor grossly intact               Skin: Skin is warm. No rashes, no other new lesions, LE edema - none               Psychiatric: Pt behavior is normal without agitation   Micro: none  Cardiac tracings I have personally interpreted today:  none  Pertinent Radiological findings (summarize): none   Lab Results  Component Value Date   WBC 6.1 10/21/2020   HGB 13.1 10/21/2020   HCT 37.4 10/21/2020   PLT 213.0 10/21/2020   GLUCOSE 110 (H) 10/21/2020   CHOL 171 10/21/2020   TRIG 45.0 10/21/2020   HDL 79.20 10/21/2020   LDLDIRECT 131.2 10/27/2008   LDLCALC 83 10/21/2020   ALT 11 10/21/2020   AST 15 10/21/2020   NA 140 10/21/2020   K 3.8 10/21/2020   CL 105 10/21/2020   CREATININE 0.90 10/21/2020   BUN 18 10/21/2020   CO2 29 10/21/2020   TSH 0.93 10/21/2020   INR 1.0 04/06/2020   HGBA1C 5.8 10/21/2020   MICROALBUR 2.7 (H) 10/08/2019   Assessment/Plan:  DAVANNA HE is a 65 y.o. Black or African American [2] female with  has a past medical history of Anemia, Chronic venous insufficiency, Depression, Diabetes mellitus without complication (HCC), Fracture of shaft of right radius (01/06/2013), History of colonic polyps, Hyperlipidemia, Hypertension, Hyperthyroidism, Hypothyroidism (01/19/2015), Leg length discrepancy, Low back pain, Lymphedema, Lymphedema, Neuromuscular disorder (HCC), Osteoporosis, and Sacroiliitis (HCC).  Encounter for well adult exam with abnormal findings Age and sex appropriate education and counseling updated with regular exercise and diet Referrals for preventative services - none needed Immunizations addressed - none needed Smoking counseling  - none needed Evidence for depression or other mood disorder - none significant Most recent labs reviewed. I have  personally reviewed and have noted: 1) the patient's medical and social history 2) The patient's current medications and supplements 3) The patient's height, weight, and BMI have been recorded in the chart   Diabetes mellitus with coincident hypertension (HCC) Lab Results  Component Value Date   HGBA1C 5.8 10/21/2020   Stable, pt to continue current medical treatment  - diet   Essential hypertension Uncontrolled - pt declines change in tx for now, cont cardizem, hydralazine, avapro, and continue to monitor at home and next visit BP Readings from Last 3 Encounters:  10/25/20 (!) 142/78  05/11/20 (!) 148/82  04/14/20 (!) 160/85     Hyperglycemia Lab Results  Component Value Date   HGBA1C 5.8 10/21/2020   Stable, pt to continue current medical treatment  - diet   Hyperlipidemia Lab Results  Component Value Date   LDLCALC 83 10/21/2020   Stable, pt to continue current statin lipitor 10   Hypothyroidism Lab Results  Component Value Date   TSH 0.93 10/21/2020   Stable, pt to continue levothyroxine   KNEE PAIN, RIGHT For decreased  tramadol ER 100 qd   Followup: Return in about 6 months (around 04/27/2021).  Oliver Barre, MD 10/26/2020 10:25 PM [facility]

## 2020-10-26 ENCOUNTER — Encounter: Payer: Self-pay | Admitting: Internal Medicine

## 2020-10-26 MED ORDER — ALENDRONATE SODIUM 70 MG PO TABS: ORAL_TABLET | ORAL | 3 refills | Status: DC

## 2020-10-26 MED ORDER — TRAMADOL HCL ER 100 MG PO TB24: 100.0000 mg | ORAL_TABLET | Freq: Every day | ORAL | 2 refills | Status: DC

## 2020-10-26 MED ORDER — GUANFACINE HCL 2 MG PO TABS: ORAL_TABLET | ORAL | 3 refills | Status: DC

## 2020-10-26 MED ORDER — IRBESARTAN 300 MG PO TABS: 300.0000 mg | ORAL_TABLET | Freq: Every day | ORAL | 3 refills | Status: DC

## 2020-10-26 MED ORDER — POTASSIUM CHLORIDE CRYS ER 10 MEQ PO TBCR: 10.0000 meq | EXTENDED_RELEASE_TABLET | Freq: Every day | ORAL | 3 refills | Status: DC

## 2020-10-26 MED ORDER — HYDRALAZINE HCL 50 MG PO TABS: 50.0000 mg | ORAL_TABLET | Freq: Three times a day (TID) | ORAL | 3 refills | Status: DC

## 2020-10-26 MED ORDER — FUROSEMIDE 40 MG PO TABS: 40.0000 mg | ORAL_TABLET | Freq: Every day | ORAL | 3 refills | Status: DC | PRN

## 2020-10-26 MED ORDER — ATORVASTATIN CALCIUM 10 MG PO TABS: 10.0000 mg | ORAL_TABLET | Freq: Every day | ORAL | 3 refills | Status: DC

## 2020-10-26 MED ORDER — DILTIAZEM HCL ER COATED BEADS 360 MG PO CP24: 360.0000 mg | ORAL_CAPSULE | Freq: Every day | ORAL | 3 refills | Status: DC

## 2020-10-26 MED ORDER — LEVOTHYROXINE SODIUM 100 MCG PO TABS: 100.0000 ug | ORAL_TABLET | Freq: Every day | ORAL | 3 refills | Status: DC

## 2020-10-26 NOTE — Assessment & Plan Note (Signed)
Lab Results  Component Value Date   TSH 0.93 10/21/2020   Stable, pt to continue levothyroxine

## 2020-10-26 NOTE — Assessment & Plan Note (Signed)

## 2020-10-26 NOTE — Assessment & Plan Note (Signed)
For decreased tramadol ER 100 qd

## 2020-10-26 NOTE — Assessment & Plan Note (Signed)
Lab Results  Component Value Date   HGBA1C 5.8 10/21/2020   Stable, pt to continue current medical treatment  - diet

## 2020-10-26 NOTE — Assessment & Plan Note (Signed)
Uncontrolled - pt declines change in tx for now, cont cardizem, hydralazine, avapro, and continue to monitor at home and next visit BP Readings from Last 3 Encounters:  10/25/20 (!) 142/78  05/11/20 (!) 148/82  04/14/20 (!) 160/85

## 2020-10-26 NOTE — Assessment & Plan Note (Signed)
Lab Results  Component Value Date   LDLCALC 83 10/21/2020   Stable, pt to continue current statin lipitor 10

## 2020-10-26 NOTE — Patient Instructions (Signed)
Ok for decreased tramadol ER 100 qd  Please continue all other medications as before, and refills have been done if requested.  Please have the pharmacy call with any other refills you may need.  Please continue your efforts at being more active, low cholesterol diet, and weight control.  You are otherwise up to date with prevention measures today.  Please keep your appointments with your specialists as you may have planned  Please make an Appointment to return in 6 months, or sooner if needed

## 2020-10-26 NOTE — Assessment & Plan Note (Signed)
Lab Results  Component Value Date   HGBA1C 5.8 10/21/2020   Stable, pt to continue current medical treatment  - diet  

## 2020-11-10 ENCOUNTER — Ambulatory Visit: Payer: BC Managed Care – PPO | Admitting: Internal Medicine

## 2020-12-11 ENCOUNTER — Other Ambulatory Visit: Payer: Self-pay | Admitting: Internal Medicine

## 2020-12-11 NOTE — Telephone Encounter (Signed)
Please refill as per office routine med refill policy (all routine meds refilled for 3 mo or monthly per pt preference up to one year from last visit, then month to month grace period for 3 mo, then further med refills will have to be denied)  

## 2020-12-21 ENCOUNTER — Encounter: Payer: Self-pay | Admitting: Internal Medicine

## 2020-12-23 ENCOUNTER — Encounter: Payer: Self-pay | Admitting: Internal Medicine

## 2020-12-29 ENCOUNTER — Other Ambulatory Visit: Payer: Self-pay

## 2020-12-29 ENCOUNTER — Ambulatory Visit: Payer: BC Managed Care – PPO | Admitting: Internal Medicine

## 2020-12-29 ENCOUNTER — Encounter: Payer: Self-pay | Admitting: Internal Medicine

## 2020-12-29 VITALS — BP 136/70 | HR 70 | Temp 98.9°F | Ht 68.0 in | Wt 166.0 lb

## 2020-12-29 DIAGNOSIS — R739 Hyperglycemia, unspecified: Secondary | ICD-10-CM | POA: Diagnosis not present

## 2020-12-29 DIAGNOSIS — I10 Essential (primary) hypertension: Secondary | ICD-10-CM

## 2020-12-29 DIAGNOSIS — M79604 Pain in right leg: Secondary | ICD-10-CM

## 2020-12-29 DIAGNOSIS — I890 Lymphedema, not elsewhere classified: Secondary | ICD-10-CM

## 2020-12-29 DIAGNOSIS — M79605 Pain in left leg: Secondary | ICD-10-CM

## 2020-12-29 MED ORDER — HYDRALAZINE HCL 100 MG PO TABS: 100.0000 mg | ORAL_TABLET | Freq: Two times a day (BID) | ORAL | 3 refills | Status: DC

## 2020-12-29 MED ORDER — AMITRIPTYLINE HCL 100 MG PO TABS: ORAL_TABLET | ORAL | 1 refills | Status: DC

## 2020-12-29 NOTE — Patient Instructions (Signed)
Please take all new medication as prescribed - the Elavil (amytriptilene) as needed  Please continue all other medications as before, and refills have been done if requested.  Please have the pharmacy call with any other refills you may need.  Please continue your efforts at being more active, low cholesterol diet, and weight control.  You are otherwise up to date with prevention measures today.  Please keep your appointments with your specialists as you may have planned  You will be contacted regarding the referral for: artery circulation testing

## 2020-12-29 NOTE — Progress Notes (Signed)
Patient ID: Penny Yates, female   DOB: 1955/06/13, 65 y.o.   MRN: [id number]        Chief Complaint: follow up distal leg pain       HPI:  Penny Yates is a 65 y.o. female here with c/o worsening distal LE nubmness and discomfort noticed after tramadol decreased.  Pt denies chest pain, increased sob or doe, wheezing, orthopnea, PND, increased LE swelling, palpitations, dizziness or syncope.   Pt denies polydipsia, polyuria, or new focal neuro s/s.   Pt denies fever, wt loss, night sweats, loss of appetite, or other constitutional symptoms          Wt Readings from Last 3 Encounters:  12/29/20 166 lb (75.3 kg)  10/25/20 164 lb 12.8 oz (74.8 kg)  05/11/20 166 lb (75.3 kg)   BP Readings from Last 3 Encounters:  12/29/20 136/70  10/25/20 (!) 142/78  05/11/20 (!) 148/82         Past Medical History:  Diagnosis Date   Anemia    Chronic venous insufficiency    LE's   Depression    pt denies   Diabetes mellitus without complication (HCC)    no meds  pt. denies states she is pre diabetic    Fracture of shaft of right radius 01/06/2013   History of colonic polyps    multiple of succeeding years 96, 97, 98, and 99   Hyperlipidemia    Hypertension    Hyperthyroidism    s/p rad Iodine-ellison   Hypothyroidism 01/19/2015   Leg length discrepancy    right leg longer   Low back pain    Lymphedema    left arm, both legs; primary lymphedema   Lymphedema    legs   Neuromuscular disorder (HCC)    sciatiaca   Osteoporosis    Sacroiliitis (HCC)    history of   Past Surgical History:  Procedure Laterality Date   [place]   EYE SURGERY     both eyes-muscle repair  cataract right eye   OPEN REDUCTION INTERNAL FIXATION (ORIF) DISTAL RADIAL FRACTURE Right 01/06/2013   Procedure: RIGHT OPEN REDUCTION INTERNAL FIXATION (ORIF) DISTAL ARTICULATING RADIAL FRACTURE ;  Surgeon: Eulas Post, MD;  Location: [facility];  Service: Orthopedics;  Laterality: Right;    REPLACEMENT TOTAL KNEE  june 2007   right   REVISION OF SCAR TISSUE RECTUS MUSCLE  11/08   right knee   TOTAL KNEE REVISION Right 04/13/2020   Procedure: Right knee polyethylene revision.;  Surgeon: Ollen Gross, MD;  Location: [facility];  Service: Orthopedics;  Laterality: Right;    TUBAL LIGATION      reports that she has never smoked. She has never used smokeless tobacco. She reports that she does not drink alcohol and does not use drugs. family history includes Cancer in her mother; Colon cancer (age of onset: 39) in her mother; Diabetes in her mother and another family member; Heart disease in her maternal grandmother; Kidney disease in an other family member; Stroke in an other family member. Allergies  Allergen Reactions   Ace Inhibitors Cough   Lisinopril Other (See Comments)    Cough   Current Outpatient Medications on File Prior to Visit  Medication Sig Dispense Refill   alendronate (FOSAMAX) 70 MG tablet TAKE 1 TABLET BY MOUTH EVERY 7 DAYS. TAKE WITH A FULL GLASS OF WATER ON AN EMPTY STOMACH. 12 tablet 3   ALPHAGAN P 0.1 % SOLN Place  1 drop into both eyes in the morning and at bedtime.   11   amoxicillin (AMOXIL) 500 MG capsule Take 1,000 mg by mouth See admin instructions. Take 2 capsules by mouth 1 hr prior to dental procedure, then 2 capsules 4 hrs after  1   ascorbic acid (VITAMIN C) 500 MG tablet Take 500 mg by mouth daily.     atorvastatin (LIPITOR) 10 MG tablet Take 1 tablet (10 mg total) by mouth daily. 90 tablet 3   Biotin 5000 MCG CAPS Take 5,000 mcg by mouth daily.     cholecalciferol (VITAMIN D3) 25 MCG (1000 UNIT) tablet Take 1,000 Units by mouth daily.     diclofenac Sodium (VOLTAREN) 1 % GEL Apply 2 g topically 4 (four) times daily as needed (pain).      diltiazem (CARDIZEM CD) 360 MG 24 hr capsule TAKE 1 CAPSULE BY MOUTH EVERY DAY 90 capsule 1   dorzolamide-timolol (COSOPT) 22.3-6.8 MG/ML ophthalmic solution Place 1 drop into both eyes 2 (two) times  daily.      DUREZOL 0.05 % EMUL Place 1 drop into the right eye every other day.      fish oil-omega-3 fatty acids 1000 MG capsule Take 1 g by mouth daily.     furosemide (LASIX) 40 MG tablet Take 1 tablet (40 mg total) by mouth daily as needed for edema. 90 tablet 3   guanFACINE (TENEX) 2 MG tablet TAKE 1 TABLET BY MOUTH EVERYDAY AT BEDTIME 90 tablet 3   irbesartan (AVAPRO) 300 MG tablet Take 1 tablet (300 mg total) by mouth daily. 90 tablet 3   latanoprost (XALATAN) 0.005 % ophthalmic solution Place 1 drop into both eyes at bedtime.  4   levothyroxine (SYNTHROID) 100 MCG tablet Take 1 tablet (100 mcg total) by mouth daily. 90 tablet 3   Multiple Vitamin (MULTIVITAMIN) capsule Take 1 capsule by mouth daily.     potassium chloride (KLOR-CON M10) 10 MEQ tablet Take 1 tablet (10 mEq total) by mouth daily. 90 tablet 3   Probiotic Product (PHILLIPS COLON HEALTH PO) Take 1 capsule by mouth daily.      traMADol (ULTRAM-ER) 100 MG 24 hr tablet Take 1 tablet (100 mg total) by mouth daily. 30 tablet 2   No current facility-administered medications on file prior to visit.        ROS:  All others reviewed and negative.  Objective        PE:  BP 136/70 (BP Location: Right Arm, Patient Position: Sitting, Cuff Size: Large)   Pulse 70   Temp 98.9 F (37.2 C) (Oral)   Ht 5\' 8"  (1.727 m)   Wt 166 lb (75.3 kg)   SpO2 98%   BMI 25.24 kg/m                 Constitutional: Pt appears in NAD               HENT: Head: NCAT.                Right Ear: External ear normal.                 Left Ear: External ear normal.                Eyes: . Pupils are equal, round, and reactive to light. Conjunctivae and EOM are normal               Nose: without d/c or deformity  Neck: Neck supple. Gross normal ROM               Cardiovascular: Normal rate and regular rhythm.                 Pulmonary/Chest: Effort normal and breath sounds without rales or wheezing.                Abd:  Soft, NT, ND, + BS,  no organomegaly               Neurological: Pt is alert. At baseline orientation, motor grossly intact               Skin: Skin is warm. No rashes, no other new lesions, LE edema - stable 2+ lymphedema               Psychiatric: Pt behavior is normal without agitation   Micro: none  Cardiac tracings I have personally interpreted today:  none  Pertinent Radiological findings (summarize): none   Lab Results  Component Value Date   WBC 6.1 10/21/2020   HGB 13.1 10/21/2020   HCT 37.4 10/21/2020   PLT 213.0 10/21/2020   GLUCOSE 110 (H) 10/21/2020   CHOL 171 10/21/2020   TRIG 45.0 10/21/2020   HDL 79.20 10/21/2020   LDLDIRECT 131.2 10/27/2008   LDLCALC 83 10/21/2020   ALT 11 10/21/2020   AST 15 10/21/2020   NA 140 10/21/2020   K 3.8 10/21/2020   CL 105 10/21/2020   CREATININE 0.90 10/21/2020   BUN 18 10/21/2020   CO2 29 10/21/2020   TSH 0.93 10/21/2020   INR 1.0 04/06/2020   HGBA1C 5.8 10/21/2020   MICROALBUR 2.7 (H) 10/08/2019   Assessment/Plan:  Penny Yates is a 65 y.o. Black or African American [2] female with  has a past medical history of Anemia, Chronic venous insufficiency, Depression, Diabetes mellitus without complication (HCC), Fracture of shaft of right radius (01/06/2013), History of colonic polyps, Hyperlipidemia, Hypertension, Hyperthyroidism, Hypothyroidism (01/19/2015), Leg length discrepancy, Low back pain, Lymphedema, Lymphedema, Neuromuscular disorder (HCC), Osteoporosis, and Sacroiliitis (HCC).  Bilateral leg pain Etiology unclear, for bilateral LE arterial study, and trial elavil qhs prn ,  to f/u any worsening symptoms or concerns  Essential hypertension BP Readings from Last 3 Encounters:  12/29/20 136/70  10/25/20 (!) 142/78  05/11/20 (!) 148/82   Stable, pt to continue medical treatment cardizem, avapro, hydralazine   Hyperglycemia Lab Results  Component Value Date   HGBA1C 5.8 10/21/2020   Stable, pt to continue current medical treatment   - diet   Lymphedema Chronic stable no change  Followup: Return if symptoms worsen or fail to improve.  Oliver Barre, MD 01/01/2021 8:26 PM [facility] [place] [facility] - [facility]

## 2020-12-30 ENCOUNTER — Ambulatory Visit (HOSPITAL_COMMUNITY)
Admission: RE | Admit: 2020-12-30 | Discharge: 2020-12-30 | Disposition: A | Discharge status: Home / Self Care | Payer: BC Managed Care – PPO | Source: Ambulatory Visit | Attending: Cardiovascular Disease | Admitting: Cardiovascular Disease

## 2020-12-30 DIAGNOSIS — M79604 Pain in right leg: Secondary | ICD-10-CM | POA: Diagnosis present

## 2020-12-30 DIAGNOSIS — M79605 Pain in left leg: Secondary | ICD-10-CM | POA: Insufficient documentation

## 2020-12-31 ENCOUNTER — Encounter: Payer: Self-pay | Admitting: Internal Medicine

## 2020-12-31 DIAGNOSIS — G629 Polyneuropathy, unspecified: Secondary | ICD-10-CM

## 2021-01-01 NOTE — Assessment & Plan Note (Signed)
Etiology unclear, for bilateral LE arterial study, and trial elavil qhs prn ,  to f/u any worsening symptoms or concerns

## 2021-01-01 NOTE — Assessment & Plan Note (Signed)
Lab Results  Component Value Date   HGBA1C 5.8 10/21/2020   Stable, pt to continue current medical treatment  - diet  

## 2021-01-01 NOTE — Assessment & Plan Note (Signed)
BP Readings from Last 3 Encounters:  12/29/20 136/70  10/25/20 (!) 142/78  05/11/20 (!) 148/82   Stable, pt to continue medical treatment cardizem, avapro, hydralazine

## 2021-01-01 NOTE — Assessment & Plan Note (Signed)
Chronic stable no change

## 2021-01-05 ENCOUNTER — Encounter: Payer: Self-pay | Admitting: Neurology

## 2021-01-09 ENCOUNTER — Other Ambulatory Visit: Payer: Self-pay | Admitting: Internal Medicine

## 2021-01-11 LAB — HM DIABETES EYE EXAM

## 2021-02-06 ENCOUNTER — Other Ambulatory Visit: Payer: Self-pay

## 2021-02-06 ENCOUNTER — Encounter: Payer: Self-pay | Admitting: Neurology

## 2021-02-06 ENCOUNTER — Ambulatory Visit: Payer: BC Managed Care – PPO | Admitting: Neurology

## 2021-02-06 VITALS — BP 145/92 | HR 82 | Ht 67.5 in | Wt 175.0 lb

## 2021-02-06 DIAGNOSIS — M5417 Radiculopathy, lumbosacral region: Secondary | ICD-10-CM

## 2021-02-06 NOTE — Progress Notes (Signed)
[facility] Clinic Note - Initial Visit   Date: 02/06/21  Penny Yates MRN: [id number] DOB: 27-Dec-1955   Dear Dr. Jonny Ruiz:  Thank you for your kind referral of Penny Yates for consultation of bilateral feet pain. Although her history is well known to you, please allow [place] to reiterate it for the purpose of our medical record. The patient was accompanied to the clinic by self.   History of Present Illness: Penny Yates is a 66 y.o. right-handed female with secondary hypothyroidism, hypertension, diet controlled diabetes, lymphedema, and hyperlipidemia presenting for evaluation of bilateral feet pain.   Starting around July 2022, she began having tingling/numbness involving th toes, soles, and the back of the legs.  It was worse in the morning and intermittent at first, but now is constant. She had sensation that her feet were balled up.  She also complains of chronic low back pain.  She has been taking amitriptyline 100mg  at bedtime which has significantly alleviated her symptoms.  She endorses dry eyes an dry mouth. She has some weakness in the legs, especially following her right knee surgery in December 2021.  Balance is good.  She walks unassisted. ABI is normal.  She works as an January 2022.  She lives at home with her husband.  She is nonsmoker and does nto drink alcohol.   Out-side paper records, electronic medical record, and images have been reviewed where available and summarized as:  Lab Results  Component Value Date   HGBA1C 5.8 10/21/2020   Lab Results  Component Value Date   VITAMINB12 524 10/21/2020   Lab Results  Component Value Date   TSH 0.93 10/21/2020   Lab Results  Component Value Date   ESRSEDRATE 38 (H) 06/28/2015    Past Medical History:  Diagnosis Date   Anemia    Chronic venous insufficiency    LE's   Depression    pt denies   Diabetes mellitus without complication (HCC)    no meds  pt. denies states she is  pre diabetic    Fracture of shaft of right radius 01/06/2013   History of colonic polyps    multiple of succeeding years 96, 97, 98, and 99   Hyperlipidemia    Hypertension    Hyperthyroidism    s/p rad Iodine-ellison   Hypothyroidism 01/19/2015   Leg length discrepancy    right leg longer   Low back pain    Lymphedema    left arm, both legs; primary lymphedema   Lymphedema    legs   Neuromuscular disorder (HCC)    sciatiaca   Osteoporosis    Sacroiliitis (HCC)    history of    Past Surgical History:  Procedure Laterality Date   [place]   EYE SURGERY     both eyes-muscle repair  cataract right eye   OPEN REDUCTION INTERNAL FIXATION (ORIF) DISTAL RADIAL FRACTURE Right 01/06/2013   Procedure: RIGHT OPEN REDUCTION INTERNAL FIXATION (ORIF) DISTAL ARTICULATING RADIAL FRACTURE ;  Surgeon: 01/08/2013, MD;  Location: [facility];  Service: Orthopedics;  Laterality: Right;   REPLACEMENT TOTAL KNEE  june 2007   right   REVISION OF SCAR TISSUE RECTUS MUSCLE  11/08   right knee   TOTAL KNEE REVISION Right 04/13/2020   Procedure: Right knee polyethylene revision.;  Surgeon: 04/15/2020, MD;  Location: [facility];  Service: Orthopedics;  Laterality: Right;  Ollen Gross   TUBAL LIGATION  Medications:  Outpatient Encounter Medications as of 02/06/2021  Medication Sig   alendronate (FOSAMAX) 70 MG tablet TAKE 1 TABLET BY MOUTH EVERY 7 DAYS. TAKE WITH A FULL GLASS OF WATER ON AN EMPTY STOMACH.   ALPHAGAN P 0.1 % SOLN Place 1 drop into both eyes in the morning and at bedtime.    amitriptyline (ELAVIL) 100 MG tablet 1/2- 1 tab by mouth at bedtime as needed   amoxicillin (AMOXIL) 500 MG capsule Take 1,000 mg by mouth See admin instructions. Take 2 capsules by mouth 1 hr prior to dental procedure, then 2 capsules 4 hrs after   ascorbic acid (VITAMIN C) 500 MG tablet Take 500 mg by mouth daily.   atorvastatin (LIPITOR) 10 MG tablet Take 1 tablet (10 mg total)  by mouth daily.   Biotin 5000 MCG CAPS Take 5,000 mcg by mouth daily.   cholecalciferol (VITAMIN D3) 25 MCG (1000 UNIT) tablet Take 1,000 Units by mouth daily.   diclofenac Sodium (VOLTAREN) 1 % GEL Apply 2 g topically 4 (four) times daily as needed (pain).    diltiazem (CARDIZEM CD) 360 MG 24 hr capsule TAKE 1 CAPSULE BY MOUTH EVERY DAY   dorzolamide-timolol (COSOPT) 22.3-6.8 MG/ML ophthalmic solution Place 1 drop into both eyes 2 (two) times daily.    DUREZOL 0.05 % EMUL Place 1 drop into the right eye every other day.    fish oil-omega-3 fatty acids 1000 MG capsule Take 1 g by mouth daily.   furosemide (LASIX) 40 MG tablet Take 1 tablet (40 mg total) by mouth daily as needed for edema.   guanFACINE (TENEX) 2 MG tablet TAKE 1 TABLET BY MOUTH EVERYDAY AT BEDTIME   hydrALAZINE (APRESOLINE) 100 MG tablet Take 1 tablet (100 mg total) by mouth 2 (two) times daily.   irbesartan (AVAPRO) 300 MG tablet Take 1 tablet (300 mg total) by mouth daily.   latanoprost (XALATAN) 0.005 % ophthalmic solution Place 1 drop into both eyes at bedtime.   levothyroxine (SYNTHROID) 100 MCG tablet Take 1 tablet (100 mcg total) by mouth daily.   Multiple Vitamin (MULTIVITAMIN) capsule Take 1 capsule by mouth daily.   potassium chloride (KLOR-CON M10) 10 MEQ tablet Take 1 tablet (10 mEq total) by mouth daily.   Probiotic Product (PHILLIPS COLON HEALTH PO) Take 1 capsule by mouth daily.    traMADol (ULTRAM-ER) 100 MG 24 hr tablet Take 1 tablet (100 mg total) by mouth daily.   No facility-administered encounter medications on file as of 02/06/2021.    Allergies:  Allergies  Allergen Reactions   Ace Inhibitors Cough   Lisinopril Other (See Comments)    Cough    Family History: Family History  Problem Relation Age of Onset   Colon cancer Mother 58   Diabetes Mother    Cancer Mother        Colon Cancer   Heart disease Maternal Grandmother    Diabetes Other        mother and several others in family   Stroke  Other    Kidney disease Other    Thyroid disease Neg Hx    Esophageal cancer Neg Hx    Rectal cancer Neg Hx    Stomach cancer Neg Hx     Social History: Social History   Tobacco Use   Smoking status: Never   Smokeless tobacco: Never  Vaping Use   Vaping Use: Never used  Substance Use Topics   Alcohol use: No   Drug use: No  Social History   Social History Narrative   Moved to [place] in Jan 2009. Married with children. 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 Looking for a new job.      Patient has two sons. One is deceased.   Right Handed    Lives in a two story home     Vital Signs:  BP (!) 145/92   Pulse 82   Ht 5' 7.5" (1.715 m)   Wt 175 lb (79.4 kg)   SpO2 96%   BMI 27.00 kg/m   Neurological Exam: MENTAL STATUS including orientation to time, place, person, recent and remote memory, attention span and concentration, language, and fund of knowledge is normal.  Speech is not dysarthric.  CRANIAL NERVES: II:  No visual field defects.   III-IV-VI: Right eye is surgical.  Left Pupil round and reactive to light.  Normal conjugate, extra-ocular eye movements in all directions of gaze.  No nystagmus.  No ptosis.   V:  Normal facial sensation.    VII:  Normal facial symmetry and movements.   VIII:  Normal hearing and vestibular function.   IX-X:  Normal palatal movement.   XI:  Normal shoulder shrug and head rotation.   XII:  Normal tongue strength and range of motion, no deviation or fasciculation.  MOTOR:  No atrophy, fasciculations or abnormal movements.  No pronator drift. Bilateral leg edema.  Upper Extremity:  Right  Left  Deltoid  5/5   5/5   Biceps  5/5   5/5   Triceps  5/5   5/5   Infraspinatus 5/5  5/5  Medial pectoralis 5/5  5/5  Wrist extensors  5/5   5/5   Wrist flexors  5/5   5/5   Finger extensors  5/5   5/5   Finger flexors  5/5   5/5   Dorsal interossei  5/5   5/5   Abductor  pollicis  5/5   5/5   Tone (Ashworth scale)  0  0   Lower Extremity:  Right  Left  Hip flexors  5/5   5/5   Hip extensors  5/5   5/5   Adductor 5/5  5/5  Abductor 5/5  5/5  Knee flexors  5/5   5/5   Knee extensors  5/5   5/5   Dorsiflexors  5/5   5/5   Plantarflexors  5/5   5/5   Toe extensors  5/5   5/5   Toe flexors  5/5   5/5   Tone (Ashworth scale)  0  0   MSRs:  Right        Left                  brachioradialis 2+  2+  biceps 2+  2+  triceps 2+  2+  patellar 2+  2+  ankle jerk 2+  2+  Hoffman no  no  plantar response down  down   SENSORY:  Normal and symmetric perception of light touch, pinprick, vibration, and proprioception.  Romberg's sign absent.   COORDINATION/GAIT: Normal finger-to- nose-finger.  Intact rapid alternating movements bilaterally.   Gait narrow based and stable. Tandem and stressed gait intact.    IMPRESSION: Bilateral leg and feet paresthesias with chronic low back pain is most suggestive of possible lumbosoacral radiculopathy.  Neuropathy cannot be excluded, but her exam does not support this since she has intact distal sensation,  reflexes, and strength.  We discussed performing NCS/EMG legs and she would like to think about it.  She is very pleased with the pain relief with amitriptyline 100mg  at bedtime.  She can try a lower dose of 50mg  qhs to reduce side effets of dry mouth and dry eyes.     Thank you for allowing me to participate in patient's care.  If I can answer any additional questions, I would be pleased to do so.    Sincerely,    Jakorian Marengo K. , DO

## 2021-02-06 NOTE — Patient Instructions (Signed)
You can try lower dose of amitriptyline 50mg  at bedtime   If you choose to have nerve testing, please call my office to schedule.  Do not apply any lotion or oil to your skin on the day of testing.

## 2021-02-10 ENCOUNTER — Other Ambulatory Visit: Payer: Self-pay | Admitting: Internal Medicine

## 2021-03-13 ENCOUNTER — Ambulatory Visit: Payer: BC Managed Care – PPO | Admitting: Neurology

## 2021-05-18 ENCOUNTER — Other Ambulatory Visit: Payer: Self-pay | Admitting: Internal Medicine

## 2021-08-14 ENCOUNTER — Other Ambulatory Visit (INDEPENDENT_AMBULATORY_CARE_PROVIDER_SITE_OTHER): Payer: BC Managed Care – PPO

## 2021-08-14 ENCOUNTER — Ambulatory Visit: Payer: BC Managed Care – PPO | Admitting: Internal Medicine

## 2021-08-14 ENCOUNTER — Other Ambulatory Visit: Payer: BC Managed Care – PPO

## 2021-08-14 ENCOUNTER — Encounter: Payer: Self-pay | Admitting: Internal Medicine

## 2021-08-14 VITALS — BP 146/74 | HR 91 | Temp 98.8°F | Ht 67.5 in | Wt 195.0 lb

## 2021-08-14 DIAGNOSIS — E559 Vitamin D deficiency, unspecified: Secondary | ICD-10-CM | POA: Diagnosis not present

## 2021-08-14 DIAGNOSIS — E119 Type 2 diabetes mellitus without complications: Secondary | ICD-10-CM | POA: Diagnosis not present

## 2021-08-14 DIAGNOSIS — E538 Deficiency of other specified B group vitamins: Secondary | ICD-10-CM

## 2021-08-14 DIAGNOSIS — I10 Essential (primary) hypertension: Secondary | ICD-10-CM

## 2021-08-14 DIAGNOSIS — R739 Hyperglycemia, unspecified: Secondary | ICD-10-CM | POA: Diagnosis not present

## 2021-08-14 DIAGNOSIS — E7800 Pure hypercholesterolemia, unspecified: Secondary | ICD-10-CM

## 2021-08-14 DIAGNOSIS — E039 Hypothyroidism, unspecified: Secondary | ICD-10-CM | POA: Diagnosis not present

## 2021-08-14 LAB — BASIC METABOLIC PANEL
BUN: 20 mg/dL (ref 6–23)
CO2: 29 mEq/L (ref 19–32)
Calcium: 10.2 mg/dL (ref 8.4–10.5)
Chloride: 101 mEq/L (ref 96–112)
Creatinine, Ser: 0.92 mg/dL (ref 0.40–1.20)
GFR: 64.29 mL/min (ref >60.00)
Glucose, Bld: 128 mg/dL — ABNORMAL HIGH (ref 70–99)
Potassium: 4 mEq/L (ref 3.5–5.1)
Sodium: 138 mEq/L (ref 135–145)

## 2021-08-14 LAB — HEPATIC FUNCTION PANEL
ALT: 20 U/L (ref 0–35)
AST: 18 U/L (ref 0–37)
Albumin: 4.1 g/dL (ref 3.5–5.2)
Alkaline Phosphatase: 60 U/L (ref 39–117)
Bilirubin, Direct: 0.1 mg/dL (ref 0.0–0.3)
Total Bilirubin: 0.5 mg/dL (ref 0.2–1.2)
Total Protein: 7.4 g/dL (ref 6.0–8.3)

## 2021-08-14 LAB — HEMOGLOBIN A1C: Hgb A1c MFr Bld: 7.3 % — ABNORMAL HIGH (ref 4.6–6.5)

## 2021-08-14 LAB — VITAMIN D 25 HYDROXY (VIT D DEFICIENCY, FRACTURES): VITD: 51.02 ng/mL (ref 30.00–100.00)

## 2021-08-14 LAB — LIPID PANEL
Cholesterol: 167 mg/dL (ref 0–200)
HDL: 97.6 mg/dL (ref >39.00)
LDL Cholesterol: 56 mg/dL (ref 0–99)
NonHDL: 69.01
Total CHOL/HDL Ratio: 2
Triglycerides: 64 mg/dL (ref 0.0–149.0)
VLDL: 12.8 mg/dL (ref 0.0–40.0)

## 2021-08-14 LAB — VITAMIN B12: Vitamin B-12: >1504 pg/mL — ABNORMAL HIGH (ref 211–911)

## 2021-08-14 MED ORDER — METFORMIN HCL ER 500 MG PO TB24: 500.0000 mg | ORAL_TABLET | Freq: Every day | ORAL | 3 refills | Status: DC

## 2021-08-14 MED ORDER — OZEMPIC (0.25 OR 0.5 MG/DOSE) 2 MG/1.5ML ~~LOC~~ SOPN: 0.5000 mg | PEN_INJECTOR | SUBCUTANEOUS | 3 refills | Status: DC

## 2021-08-14 NOTE — Progress Notes (Signed)
Patient ID: Penny Yates, female   DOB: [birth year], 66 y.o.   MRN: [id number] ? ? ? ?    Chief Complaint: follow up HTN, HLD and DM ? ?     HPI:  Penny Yates is a 66 y.o. female here with c/o wt gain over 20 lbs due to excess calories and reduced activity;  Pt denies chest pain, increased sob or doe, wheezing, orthopnea, PND, increased LE swelling, palpitations, dizziness or syncope.   Pt denies polydipsia, polyuria, or new focal neuro s/s.    Pt denies fever, wt loss, night sweats, loss of appetite, or other constitutional symptoms  BP at home < 140/90  Trying to follow DM diet.  Denies hyper or hypo thyroid symptoms such as voice, skin or hair change.   ?      ?Wt Readings from Last 3 Encounters:  ?08/14/21 195 lb (88.5 kg)  ?02/06/21 175 lb (79.4 kg)  ?12/29/20 166 lb (75.3 kg)  ? ?BP Readings from Last 3 Encounters:  ?08/14/21 (!) 146/74  ?02/06/21 (!) 145/92  ?12/29/20 136/70  ? ?      ?Past Medical History:  ?Diagnosis Date  ? Anemia   ? Chronic venous insufficiency   ? LE's  ? Depression   ? pt denies  ? Diabetes mellitus without complication (HCC)   ? no meds  pt. denies states she is pre diabetic   ? Fracture of shaft of right radius 01/06/2013  ? History of colonic polyps   ? multiple of succeeding years 96, 97, 98, and 99  ? Hyperlipidemia   ? Hypertension   ? Hyperthyroidism   ? s/p rad Iodine-ellison  ? Hypothyroidism 01/19/2015  ? Leg length discrepancy   ? right leg longer  ? Low back pain   ? Lymphedema   ? left arm, both legs; primary lymphedema  ? Lymphedema   ? legs  ? Neuromuscular disorder (HCC)   ? sciatiaca  ? Osteoporosis   ? Sacroiliitis (HCC)   ? history of  ? ?Past Surgical History:  ?Procedure Laterality Date  ? [place]  ? EYE SURGERY    ? both eyes-muscle repair  cataract right eye  ? OPEN REDUCTION INTERNAL FIXATION (ORIF) DISTAL RADIAL FRACTURE Right 01/06/2013  ? Procedure: RIGHT OPEN REDUCTION INTERNAL FIXATION (ORIF) DISTAL ARTICULATING RADIAL FRACTURE ;  Surgeon:  Eulas Post, MD;  Location: [facility];  Service: Orthopedics;  Laterality: Right;  ? REPLACEMENT TOTAL KNEE  june 2007  ? right  ? REVISION OF SCAR TISSUE RECTUS MUSCLE  11/08  ? right knee  ? TOTAL KNEE REVISION Right 04/13/2020  ? Procedure: Right knee polyethylene revision.;  Surgeon: Ollen Gross, MD;  Location: [facility];  Service: Orthopedics;  Laterality: Right;   ? TUBAL LIGATION    ? ? reports that she has never smoked. She has never used smokeless tobacco. She reports that she does not drink alcohol and does not use drugs. ?family history includes Cancer in her mother; Colon cancer (age of onset: 34) in her mother; Diabetes in her mother and another family member; Heart disease in her maternal grandmother; Kidney disease in an other family member; Stroke in an other family member. ?Allergies  ?Allergen Reactions  ? Ace Inhibitors Cough  ? Lisinopril Other (See Comments)  ?  Cough  ? ?Current Outpatient Medications on File Prior to Visit  ?Medication Sig Dispense Refill  ? alendronate (FOSAMAX) 70 MG tablet TAKE 1 TABLET  BY MOUTH EVERY 7 DAYS. TAKE WITH A FULL GLASS OF WATER ON AN EMPTY STOMACH. 12 tablet 3  ? ALPHAGAN P 0.1 % SOLN Place 1 drop into both eyes in the morning and at bedtime.   11  ? amitriptyline (ELAVIL) 100 MG tablet 1/2- 1 TAB BY MOUTH AT BEDTIME AS NEEDED 90 tablet 1  ? amoxicillin (AMOXIL) 500 MG capsule Take 1,000 mg by mouth See admin instructions. Take 2 capsules by mouth 1 hr prior to dental procedure, then 2 capsules 4 hrs after  1  ? ascorbic acid (VITAMIN C) 500 MG tablet Take 500 mg by mouth daily.    ? atorvastatin (LIPITOR) 10 MG tablet Take 1 tablet (10 mg total) by mouth daily. 90 tablet 3  ? Biotin 5000 MCG CAPS Take 5,000 mcg by mouth daily.    ? cholecalciferol (VITAMIN D3) 25 MCG (1000 UNIT) tablet Take 1,000 Units by mouth daily.    ? diclofenac Sodium (VOLTAREN) 1 % GEL Apply 2 g topically 4 (four) times daily as needed (pain).     ? diltiazem  (CARDIZEM CD) 360 MG 24 hr capsule TAKE 1 CAPSULE BY MOUTH EVERY DAY 90 capsule 1  ? dorzolamide-timolol (COSOPT) 22.3-6.8 MG/ML ophthalmic solution Place 1 drop into both eyes 2 (two) times daily.     ? DUREZOL 0.05 % EMUL Place 1 drop into the right eye every other day.     ? fish oil-omega-3 fatty acids 1000 MG capsule Take 1 g by mouth daily.    ? furosemide (LASIX) 40 MG tablet Take 1 tablet (40 mg total) by mouth daily as needed for edema. 90 tablet 3  ? guanFACINE (TENEX) 2 MG tablet TAKE 1 TABLET BY MOUTH EVERYDAY AT BEDTIME 90 tablet 3  ? hydrALAZINE (APRESOLINE) 100 MG tablet Take 1 tablet (100 mg total) by mouth 2 (two) times daily. 180 tablet 3  ? irbesartan (AVAPRO) 300 MG tablet Take 1 tablet (300 mg total) by mouth daily. 90 tablet 3  ? latanoprost (XALATAN) 0.005 % ophthalmic solution Place 1 drop into both eyes at bedtime.  4  ? levothyroxine (SYNTHROID) 100 MCG tablet Take 1 tablet (100 mcg total) by mouth daily. 90 tablet 3  ? Multiple Vitamin (MULTIVITAMIN) capsule Take 1 capsule by mouth daily.    ? potassium chloride (KLOR-CON M10) 10 MEQ tablet Take 1 tablet (10 mEq total) by mouth daily. 90 tablet 3  ? Probiotic Product (PHILLIPS COLON HEALTH PO) Take 1 capsule by mouth daily.     ? traMADol (ULTRAM-ER) 100 MG 24 hr tablet TAKE 1 TABLET BY MOUTH EVERY DAY 30 tablet 2  ? ?No current facility-administered medications on file prior to visit.  ? ?     ROS:  All others reviewed and negative. ? ?Objective  ? ?     PE:  BP (!) 146/74 (BP Location: Right Arm, Patient Position: Sitting, Cuff Size: Large)   Pulse 91   Temp 98.8 ?F (37.1 ?C) (Oral)   Ht 5' 7.5" (1.715 m)   Wt 195 lb (88.5 kg)   SpO2 100%   BMI 30.09 kg/m?  ? ?              Constitutional: Pt appears in NAD ?              HENT: Head: NCAT.  ?              Right Ear: External ear normal.   ?  Left Ear: External ear normal.  ?              Eyes: . Pupils are equal, round, and reactive to light. Conjunctivae and EOM are  normal ?              Nose: without d/c or deformity ?              Neck: Neck supple. Gross normal ROM ?              Cardiovascular: Normal rate and regular rhythm.   ?              Pulmonary/Chest: Effort normal and breath sounds without rales or wheezing.  ?              Abd:  Soft, NT, ND, + BS, no organomegaly ?              Neurological: Pt is alert. At baseline orientation, motor grossly intact ?              Skin: Skin is warm. No rashes, no other new lesions, LE edema - none ?              Psychiatric: Pt behavior is normal without agitation  ? ?Micro: none ? ?Cardiac tracings I have personally interpreted today:  none ? ?Pertinent Radiological findings (summarize): none  ? ?Lab Results  ?Component Value Date  ? WBC 6.1 10/21/2020  ? HGB 13.1 10/21/2020  ? HCT 37.4 10/21/2020  ? PLT 213.0 10/21/2020  ? GLUCOSE 128 (H) 08/14/2021  ? CHOL 167 08/14/2021  ? TRIG 64.0 08/14/2021  ? HDL 97.60 08/14/2021  ? LDLDIRECT 131.2 10/27/2008  ? LDLCALC 56 08/14/2021  ? ALT 20 08/14/2021  ? AST 18 08/14/2021  ? NA 138 08/14/2021  ? K 4.0 08/14/2021  ? CL 101 08/14/2021  ? CREATININE 0.92 08/14/2021  ? BUN 20 08/14/2021  ? CO2 29 08/14/2021  ? TSH 0.93 10/21/2020  ? INR 1.0 04/06/2020  ? HGBA1C 7.3 (H) 08/14/2021  ? MICROALBUR 2.7 (H) 10/08/2019  ? ?Assessment/Plan:  ?Threasa BeardsDeborah S Coop is a 66 y.o. Black or African American [2] female with  has a past medical history of Anemia, Chronic venous insufficiency, Depression, Diabetes mellitus without complication (HCC), Fracture of shaft of right radius (01/06/2013), History of colonic polyps, Hyperlipidemia, Hypertension, Hyperthyroidism, Hypothyroidism (01/19/2015), Leg length discrepancy, Low back pain, Lymphedema, Lymphedema, Neuromuscular disorder (HCC), Osteoporosis, and Sacroiliitis (HCC). ? ?Hyperlipidemia ?Lab Results  ?Component Value Date  ? LDLCALC 56 08/14/2021  ? ?Stable, pt to continue current statin lipitor ? ? ?Essential hypertension ?BP Readings from Last 3  Encounters:  ?08/14/21 (!) 146/74  ?02/06/21 (!) 145/92  ?12/29/20 136/70  ? ?Uncontrolled,, pt to continue medical treatment cardizem, avapro ? ? ?Diabetes mellitus with coincident hypertension (HCC) ?Lab Result

## 2021-08-14 NOTE — Patient Instructions (Addendum)
Please take all new medication as prescribed - the metformin ER 500 mg - 1 per day, but ok to HOLD if you are able to get the ozempic instead (so only take one) ? ?Please let us know if your employer covers the [facility], as we could to that instead ? ?Please continue all other medications as before, and refills have been done if requested. ? ?Please have the pharmacy call with any other refills you may need. ? ?Please keep your appointments with your specialists as you may have planned ? ?Please make an Appointment to return in 3 months, or sooner if needed, also with Lab Appointment for testing done 3-5 days before at the FIRST FLOOR Lab (so this is for TWO appointments - please see the scheduling desk as you leave) ? ?Due to the ongoing Covid 19 pandemic, our lab now requires an appointment for any labs done at our office.  If you need labs done and do not have an appointment, please call our office ahead of time to schedule before presenting to the lab for your testing. ? ? ? ? ?

## 2021-08-16 ENCOUNTER — Encounter: Payer: Self-pay | Admitting: Internal Medicine

## 2021-08-16 NOTE — Assessment & Plan Note (Signed)
Lab Results  Component Value Date   TSH 0.93 10/21/2020   Stable, pt to continue levothyroxine  

## 2021-08-16 NOTE — Assessment & Plan Note (Signed)
Lab Results  ?Component Value Date  ? HGBA1C 7.3 (H) 08/14/2021  ? ?New uncontrolled dm, pt to start medical treatment metformin ER 500 1 qd or ozempic if ok with insurance given need for wt loss ? ?

## 2021-08-16 NOTE — Assessment & Plan Note (Signed)
BP Readings from Last 3 Encounters:  ?08/14/21 (!) 146/74  ?02/06/21 (!) 145/92  ?12/29/20 136/70  ? ?Uncontrolled,, pt to continue medical treatment cardizem, avapro ? ?

## 2021-08-16 NOTE — Assessment & Plan Note (Signed)
Lab Results  ?Component Value Date  ? [place] 56 08/14/2021  ? ?Stable, pt to continue current statin lipitor ? ?

## 2021-08-21 ENCOUNTER — Other Ambulatory Visit: Payer: Self-pay | Admitting: Internal Medicine

## 2021-09-25 ENCOUNTER — Other Ambulatory Visit: Payer: Self-pay | Admitting: Internal Medicine

## 2021-10-20 ENCOUNTER — Other Ambulatory Visit: Payer: Self-pay | Admitting: Internal Medicine

## 2021-10-20 MED ORDER — AMOXICILLIN 500 MG PO CAPS: 1000.0000 mg | ORAL_CAPSULE | ORAL | 2 refills | Status: DC

## 2021-10-24 ENCOUNTER — Other Ambulatory Visit: Payer: Self-pay | Admitting: Internal Medicine

## 2021-10-24 NOTE — Telephone Encounter (Signed)
Please refill as per office routine med refill policy (all routine meds to be refilled for 3 mo or monthly (per pt preference) up to one year from last visit, then month to month grace period for 3 mo, then further med refills will have to be denied) ? ?

## 2021-10-27 ENCOUNTER — Encounter: Payer: Self-pay | Admitting: Internal Medicine

## 2021-10-27 ENCOUNTER — Other Ambulatory Visit (INDEPENDENT_AMBULATORY_CARE_PROVIDER_SITE_OTHER): Payer: BC Managed Care – PPO

## 2021-10-27 ENCOUNTER — Ambulatory Visit (INDEPENDENT_AMBULATORY_CARE_PROVIDER_SITE_OTHER): Payer: BC Managed Care – PPO | Admitting: Internal Medicine

## 2021-10-27 VITALS — BP 118/64 | HR 70 | Temp 98.3°F | Ht 67.5 in | Wt 188.6 lb

## 2021-10-27 DIAGNOSIS — Z0001 Encounter for general adult medical examination with abnormal findings: Secondary | ICD-10-CM

## 2021-10-27 DIAGNOSIS — E538 Deficiency of other specified B group vitamins: Secondary | ICD-10-CM

## 2021-10-27 DIAGNOSIS — E119 Type 2 diabetes mellitus without complications: Secondary | ICD-10-CM

## 2021-10-27 DIAGNOSIS — N952 Postmenopausal atrophic vaginitis: Secondary | ICD-10-CM | POA: Insufficient documentation

## 2021-10-27 DIAGNOSIS — E7800 Pure hypercholesterolemia, unspecified: Secondary | ICD-10-CM | POA: Diagnosis not present

## 2021-10-27 DIAGNOSIS — G609 Hereditary and idiopathic neuropathy, unspecified: Secondary | ICD-10-CM | POA: Diagnosis not present

## 2021-10-27 DIAGNOSIS — R739 Hyperglycemia, unspecified: Secondary | ICD-10-CM | POA: Diagnosis not present

## 2021-10-27 DIAGNOSIS — I10 Essential (primary) hypertension: Secondary | ICD-10-CM | POA: Diagnosis not present

## 2021-10-27 DIAGNOSIS — Z0000 Encounter for general adult medical examination without abnormal findings: Secondary | ICD-10-CM

## 2021-10-27 DIAGNOSIS — D649 Anemia, unspecified: Secondary | ICD-10-CM

## 2021-10-27 DIAGNOSIS — E039 Hypothyroidism, unspecified: Secondary | ICD-10-CM

## 2021-10-27 DIAGNOSIS — G629 Polyneuropathy, unspecified: Secondary | ICD-10-CM | POA: Insufficient documentation

## 2021-10-27 DIAGNOSIS — E559 Vitamin D deficiency, unspecified: Secondary | ICD-10-CM

## 2021-10-27 LAB — URINALYSIS, ROUTINE W REFLEX MICROSCOPIC
Bilirubin Urine: NEGATIVE
Hgb urine dipstick: NEGATIVE
Ketones, ur: NEGATIVE
Leukocytes,Ua: NEGATIVE
Nitrite: NEGATIVE
Specific Gravity, Urine: >=1.03 — AB (ref 1.000–1.030)
Urine Glucose: NEGATIVE
Urobilinogen, UA: 1 (ref 0.0–1.0)
pH: 5.5 (ref 5.0–8.0)

## 2021-10-27 LAB — BASIC METABOLIC PANEL
BUN: 21 mg/dL (ref 6–23)
CO2: 31 mEq/L (ref 19–32)
Calcium: 9.8 mg/dL (ref 8.4–10.5)
Chloride: 103 mEq/L (ref 96–112)
Creatinine, Ser: 0.98 mg/dL (ref 0.40–1.20)
GFR: 59.51 mL/min — ABNORMAL LOW (ref >60.00)
Glucose, Bld: 187 mg/dL — ABNORMAL HIGH (ref 70–99)
Potassium: 4.3 mEq/L (ref 3.5–5.1)
Sodium: 140 mEq/L (ref 135–145)

## 2021-10-27 LAB — CBC WITH DIFFERENTIAL/PLATELET
Basophils Absolute: 0.1 10*3/uL (ref 0.0–0.1)
Basophils Relative: 1.6 % (ref 0.0–3.0)
Eosinophils Absolute: 0.3 10*3/uL (ref 0.0–0.7)
Eosinophils Relative: 6.9 % — ABNORMAL HIGH (ref 0.0–5.0)
HCT: 33.4 % — ABNORMAL LOW (ref 36.0–46.0)
Hemoglobin: 11.5 g/dL — ABNORMAL LOW (ref 12.0–15.0)
Lymphocytes Relative: 41.9 % (ref 12.0–46.0)
Lymphs Abs: 2.1 10*3/uL (ref 0.7–4.0)
MCHC: 34.3 g/dL (ref 30.0–36.0)
MCV: 106.3 fl — ABNORMAL HIGH (ref 78.0–100.0)
Monocytes Absolute: 0.3 10*3/uL (ref 0.1–1.0)
Monocytes Relative: 6.1 % (ref 3.0–12.0)
Neutro Abs: 2.2 10*3/uL (ref 1.4–7.7)
Neutrophils Relative %: 43.5 % (ref 43.0–77.0)
Platelets: 311 10*3/uL (ref 150.0–400.0)
RBC: 3.14 Mil/uL — ABNORMAL LOW (ref 3.87–5.11)
RDW: 19.8 % — ABNORMAL HIGH (ref 11.5–15.5)
WBC: 5 10*3/uL (ref 4.0–10.5)

## 2021-10-27 LAB — MICROALBUMIN / CREATININE URINE RATIO
Creatinine,U: 410.3 mg/dL
Microalb Creat Ratio: 0.5 mg/g (ref 0.0–30.0)
Microalb, Ur: 2.1 mg/dL — ABNORMAL HIGH (ref 0.0–1.9)

## 2021-10-27 LAB — VITAMIN B12: Vitamin B-12: 851 pg/mL (ref 211–911)

## 2021-10-27 LAB — HEPATIC FUNCTION PANEL
ALT: 13 U/L (ref 0–35)
AST: 14 U/L (ref 0–37)
Albumin: 3.9 g/dL (ref 3.5–5.2)
Alkaline Phosphatase: 67 U/L (ref 39–117)
Bilirubin, Direct: 0.1 mg/dL (ref 0.0–0.3)
Total Bilirubin: 0.4 mg/dL (ref 0.2–1.2)
Total Protein: 7 g/dL (ref 6.0–8.3)

## 2021-10-27 LAB — LIPID PANEL
Cholesterol: 154 mg/dL (ref 0–200)
HDL: 75.8 mg/dL (ref >39.00)
LDL Cholesterol: 67 mg/dL (ref 0–99)
NonHDL: 78.44
Total CHOL/HDL Ratio: 2
Triglycerides: 57 mg/dL (ref 0.0–149.0)
VLDL: 11.4 mg/dL (ref 0.0–40.0)

## 2021-10-27 LAB — IBC PANEL
Iron: 84 ug/dL (ref 42–145)
Saturation Ratios: 28.4 % (ref 20.0–50.0)
TIBC: 295.4 ug/dL (ref 250.0–450.0)
Transferrin: 211 mg/dL — ABNORMAL LOW (ref 212.0–360.0)

## 2021-10-27 LAB — TSH: TSH: 0.34 u[IU]/mL — ABNORMAL LOW (ref 0.35–5.50)

## 2021-10-27 LAB — FERRITIN: Ferritin: 92.1 ng/mL (ref 10.0–291.0)

## 2021-10-27 LAB — VITAMIN D 25 HYDROXY (VIT D DEFICIENCY, FRACTURES): VITD: 60.75 ng/mL (ref 30.00–100.00)

## 2021-10-27 LAB — HEMOGLOBIN A1C: Hgb A1c MFr Bld: 6.4 % (ref 4.6–6.5)

## 2021-10-27 NOTE — Assessment & Plan Note (Signed)

## 2021-10-27 NOTE — Assessment & Plan Note (Signed)
New onset, etiology unclera, for f/u iron levels

## 2021-10-27 NOTE — Patient Instructions (Signed)
Please continue all other medications as before, and refills have been done if requested.  Please have the pharmacy call with any other refills you may need.  Please continue your efforts at being more active, low cholesterol diet, and weight control.  You are otherwise up to date with prevention measures today.  Please keep your appointments with your specialists as you may have planned  Please go to the LAB at the blood drawing area for the tests to be done  - just the iron levels  You will be contacted by phone if any changes need to be made immediately.  Otherwise, you will receive a letter about your results with an explanation, but please check with MyChart first.  Please remember to sign up for MyChart if you have not done so, as this will be important to you in the future with finding out test results, communicating by private email, and scheduling acute appointments online when needed.  Please make an Appointment to return in 6 months, or sooner if needed

## 2021-10-27 NOTE — Progress Notes (Signed)
Patient ID: Penny Yates, female   DOB: 12/06/1955, 66 y.o.   MRN: [id number]         Chief Complaint:: wellness exam and Annual Exam (Patient c/o having a burning sensation in both legs)  , anemia, low thryoid, hyperglycemia, hld       HPI:  Penny Yates is a 66 y.o. female here for wellness exam; declines covid booster, o/w up to date                        Also now on 2 oral meds per optho for iritis recently, not sure of med names.  Eyes do feel better for the past month on these,  Feet doing well despite chronic lymphedema.  Wears compression stockings.  Also with new burning discomfort below the knees and feet, wondering about neuropathy, but does not want NCS/EMG for now.  ABI testing normal in sept 2023.  Has seen neurology Still has right knee swelling s/p TKR followed per ortho.    Not taking metformin or ozempic.  Sees urology every 6 mo with renal lesion possible cyst  Pt denies chest pain, increased sob or doe, wheezing, orthopnea, PND, increased LE swelling, palpitations, dizziness or syncope.   Pt denies polydipsia, polyuria, or new focal neuro s/s.  Denies hyper or hypo thyroid symptoms such as voice, skin or hair change.   Wt Readings from Last 3 Encounters:  10/27/21 188 lb 9.6 oz (85.5 kg)  08/14/21 195 lb (88.5 kg)  02/06/21 175 lb (79.4 kg)   BP Readings from Last 3 Encounters:  10/27/21 118/64  08/14/21 (!) 146/74  02/06/21 (!) 145/92   Immunization History  Administered Date(s) Administered   Fluad Quad(high Dose 65+) 12/20/2018   Influenza Inj Mdck Quad Pf 01/28/2018   Influenza Split 01/08/2011, 02/12/2012   Influenza Whole 02/18/2009   Influenza, High Dose Seasonal PF 02/11/2020   Influenza,inj,Quad PF,6+ Mos 02/19/2013, 07/06/2015, 03/09/2016, 03/09/2016, 12/18/2016   Influenza-Unspecified 12/18/2018, 01/28/2021   PFIZER Comirnaty(Gray Top)Covid-19 Tri-Sucrose Vaccine 07/30/2020   PFIZER(Purple Top)SARS-COV-2 Vaccination 05/28/2019, 06/22/2019,  01/21/2020   Pfizer Covid-19 Vaccine Bivalent Booster 65yrs & up 02/07/2021   Pneumococcal Conjugate-13 07/02/2016   Pneumococcal Polysaccharide-23 10/15/2019   Td 12/22/2004   Tdap 06/26/2016   Zoster Recombinat (Shingrix) 06/29/2017, 11/10/2017   There are no preventive care reminders to display for this patient.     Past Medical History:  Diagnosis Date   Anemia    Chronic venous insufficiency    LE's   Depression    pt denies   Diabetes mellitus without complication (HCC)    no meds  pt. denies states she is pre diabetic    Fracture of shaft of right radius 01/06/2013   History of colonic polyps    multiple of succeeding years 96, 97, 98, and 99   Hyperlipidemia    Hypertension    Hyperthyroidism    s/p rad Iodine-ellison   Hypothyroidism 01/19/2015   Leg length discrepancy    right leg longer   Low back pain    Lymphedema    left arm, both legs; primary lymphedema   Lymphedema    legs   Neuromuscular disorder (HCC)    sciatiaca   Osteoporosis    Sacroiliitis (HCC)    history of   Past Surgical History:  Procedure Laterality Date   [place]   EYE SURGERY     both eyes-muscle repair  cataract right eye  OPEN REDUCTION INTERNAL FIXATION (ORIF) DISTAL RADIAL FRACTURE Right 01/06/2013   Procedure: RIGHT OPEN REDUCTION INTERNAL FIXATION (ORIF) DISTAL ARTICULATING RADIAL FRACTURE ;  Surgeon: Eulas Post, MD;  Location: [facility];  Service: Orthopedics;  Laterality: Right;   REPLACEMENT TOTAL KNEE  june 2007   right   REVISION OF SCAR TISSUE RECTUS MUSCLE  11/08   right knee   TOTAL KNEE REVISION Right 04/13/2020   Procedure: Right knee polyethylene revision.;  Surgeon: Ollen Gross, MD;  Location: [facility];  Service: Orthopedics;  Laterality: Right;    TUBAL LIGATION      reports that she has never smoked. She has never used smokeless tobacco. She reports that she does not drink alcohol and does not use drugs. family  history includes Cancer in her mother; Colon cancer (age of onset: 4) in her mother; Diabetes in her mother and another family member; Heart disease in her maternal grandmother; Kidney disease in an other family member; Stroke in an other family member. Allergies  Allergen Reactions   Ace Inhibitors Cough   Lisinopril Other (See Comments)    Cough   Current Outpatient Medications on File Prior to Visit  Medication Sig Dispense Refill   alendronate (FOSAMAX) 70 MG tablet TAKE 1 TABLET BY MOUTH EVERY 7 DAYS. TAKE WITH A FULL GLASS OF WATER ON AN EMPTY STOMACH. 12 tablet 3   ALPHAGAN P 0.1 % SOLN Place 1 drop into both eyes in the morning and at bedtime.   11   amitriptyline (ELAVIL) 100 MG tablet 1/2- 1 TAB BY MOUTH AT BEDTIME AS NEEDED 90 tablet 1   amoxicillin (AMOXIL) 500 MG capsule Take 2 capsules (1,000 mg total) by mouth See admin instructions. Take 2 capsules by mouth 1 hr prior to dental procedure, then 2 capsules 4 hrs after 4 capsule 2   ascorbic acid (VITAMIN C) 500 MG tablet Take 500 mg by mouth daily.     atorvastatin (LIPITOR) 10 MG tablet TAKE 1 TABLET BY MOUTH EVERY DAY 90 tablet 2   Biotin 5000 MCG CAPS Take 5,000 mcg by mouth daily.     cholecalciferol (VITAMIN D3) 25 MCG (1000 UNIT) tablet Take 1,000 Units by mouth daily.     diclofenac Sodium (VOLTAREN) 1 % GEL Apply 2 g topically 4 (four) times daily as needed (pain).      diltiazem (CARDIZEM CD) 360 MG 24 hr capsule TAKE 1 CAPSULE BY MOUTH EVERY DAY 90 capsule 1   dorzolamide-timolol (COSOPT) 22.3-6.8 MG/ML ophthalmic solution Place 1 drop into both eyes 2 (two) times daily.      DUREZOL 0.05 % EMUL Place 1 drop into the right eye every other day.      fish oil-omega-3 fatty acids 1000 MG capsule Take 1 g by mouth daily.     furosemide (LASIX) 40 MG tablet Take 1 tablet (40 mg total) by mouth daily as needed for edema. 90 tablet 3   guanFACINE (TENEX) 2 MG tablet TAKE 1 TABLET BY MOUTH EVERYDAY AT BEDTIME 90 tablet 2    hydrALAZINE (APRESOLINE) 100 MG tablet Take 1 tablet (100 mg total) by mouth 2 (two) times daily. 180 tablet 3   irbesartan (AVAPRO) 300 MG tablet Take 1 tablet (300 mg total) by mouth daily. 90 tablet 3   latanoprost (XALATAN) 0.005 % ophthalmic solution Place 1 drop into both eyes at bedtime.  4   levothyroxine (SYNTHROID) 100 MCG tablet Take 1 tablet (100 mcg total) by mouth daily.  90 tablet 3   Multiple Vitamin (MULTIVITAMIN) capsule Take 1 capsule by mouth daily.     potassium chloride (KLOR-CON M10) 10 MEQ tablet TAKE 1 TABLET BY MOUTH EVERY DAY 90 tablet 2   Probiotic Product (PHILLIPS COLON HEALTH PO) Take 1 capsule by mouth daily.      traMADol (ULTRAM-ER) 100 MG 24 hr tablet TAKE 1 TABLET BY MOUTH EVERY DAY 30 tablet 2   No current facility-administered medications on file prior to visit.        ROS:  All others reviewed and negative.  Objective        PE:  BP 118/64 (BP Location: Right Arm, Patient Position: Sitting, Cuff Size: Large)   Pulse 70   Temp 98.3 F (36.8 C) (Oral)   Ht 5' 7.5" (1.715 m)   Wt 188 lb 9.6 oz (85.5 kg)   SpO2 100%   BMI 29.10 kg/m                 Constitutional: Pt appears in NAD               HENT: Head: NCAT.                Right Ear: External ear normal.                 Left Ear: External ear normal.                Eyes: . Pupils are equal, round, and reactive to light. Conjunctivae and EOM are normal               Nose: without d/c or deformity               Neck: Neck supple. Gross normal ROM               Cardiovascular: Normal rate and regular rhythm.                 Pulmonary/Chest: Effort normal and breath sounds without rales or wheezing.                Abd:  Soft, NT, ND, + BS, no organomegaly               Neurological: Pt is alert. At baseline orientation, motor grossly intact               Skin: Skin is warm. No rashes, no other new lesions, LE edema - 2-3+ chronic lymphedema                Psychiatric: Pt behavior is normal  without agitation   Micro: none  Cardiac tracings I have personally interpreted today:  none  Pertinent Radiological findings (summarize): none   Lab Results  Component Value Date   WBC 5.0 10/27/2021   HGB 11.5 (L) 10/27/2021   HCT 33.4 (L) 10/27/2021   PLT 311.0 10/27/2021   GLUCOSE 187 (H) 10/27/2021   CHOL 154 10/27/2021   TRIG 57.0 10/27/2021   HDL 75.80 10/27/2021   LDLDIRECT 131.2 10/27/2008   LDLCALC 67 10/27/2021   ALT 13 10/27/2021   AST 14 10/27/2021   NA 140 10/27/2021   K 4.3 10/27/2021   CL 103 10/27/2021   CREATININE 0.98 10/27/2021   BUN 21 10/27/2021   CO2 31 10/27/2021   TSH 0.34 (L) 10/27/2021   INR 1.0 04/06/2020   HGBA1C 6.4 10/27/2021   MICROALBUR 2.1 (H) 10/27/2021   Assessment/Plan:  Gavin Pound  RONNE SAVOIA is a 66 y.o. Black or African American [2] female with  has a past medical history of Anemia, Chronic venous insufficiency, Depression, Diabetes mellitus without complication (HCC), Fracture of shaft of right radius (01/06/2013), History of colonic polyps, Hyperlipidemia, Hypertension, Hyperthyroidism, Hypothyroidism (01/19/2015), Leg length discrepancy, Low back pain, Lymphedema, Lymphedema, Neuromuscular disorder (HCC), Osteoporosis, and Sacroiliitis (HCC).  Peripheral neuropathy Chronic persistent, has seen neurology, cont same tx  - elavil 100 qhs prn  - 1/2 -1 qhs prn  Anemia New onset, etiology unclera, for f/u iron levels  Encounter for well adult exam with abnormal findings Age and sex appropriate education and counseling updated with regular exercise and diet Referrals for preventative services - none needed Immunizations addressed -declines covid booster Smoking counseling  - none needed Evidence for depression or other mood disorder - none significant Most recent labs reviewed. I have personally reviewed and have noted: 1) the patient's medical and social history 2) The patient's current medications and supplements 3) The patient's  height, weight, and BMI have been recorded in the chart   Hypothyroidism Lab Results  Component Value Date   TSH 0.34 (L) 10/27/2021   Stable, pt to continue levothyroxine - 100 mcg qd   Hyperlipidemia Lab Results  Component Value Date   LDLCALC 67 10/27/2021   Stable, pt to continue current statin lipitor 10 mg qd   Hyperglycemia Lab Results  Component Value Date   HGBA1C 6.4 10/27/2021   Stable, pt to continue current medical treatment  - diet, wt control, excercise  Followup: Return in about 6 months (around 04/29/2022).  Oliver Barre, MD 10/27/2021 2:15 PM [facility]

## 2021-10-27 NOTE — Assessment & Plan Note (Signed)
Lab Results  Component Value Date   TSH 0.34 (L) 10/27/2021   Stable, pt to continue levothyroxine - 100 mcg qd

## 2021-10-27 NOTE — Assessment & Plan Note (Addendum)
Chronic persistent, has seen neurology, cont same tx  - elavil 100 qhs prn  - 1/2 -1 qhs prn

## 2021-10-27 NOTE — Assessment & Plan Note (Signed)
Lab Results  Component Value Date   LDLCALC 67 10/27/2021   Stable, pt to continue current statin lipitor 10 mg qd

## 2021-10-27 NOTE — Assessment & Plan Note (Signed)
Lab Results  Component Value Date   HGBA1C 6.4 10/27/2021   Stable, pt to continue current medical treatment  - diet, wt control, excercise

## 2021-10-29 ENCOUNTER — Other Ambulatory Visit: Payer: Self-pay | Admitting: Internal Medicine

## 2021-10-29 NOTE — Telephone Encounter (Signed)
Please refill as per office routine med refill policy (all routine meds to be refilled for 3 mo or monthly (per pt preference) up to one year from last visit, then month to month grace period for 3 mo, then further med refills will have to be denied) ? ?

## 2021-11-21 ENCOUNTER — Other Ambulatory Visit: Payer: Self-pay | Admitting: Internal Medicine

## 2021-11-22 ENCOUNTER — Encounter: Payer: Self-pay | Admitting: Internal Medicine

## 2022-01-06 ENCOUNTER — Other Ambulatory Visit: Payer: Self-pay | Admitting: Internal Medicine

## 2022-01-07 NOTE — Telephone Encounter (Signed)
Please refill as per office routine med refill policy (all routine meds to be refilled for 3 mo or monthly (per pt preference) up to one year from last visit, then month to month grace period for 3 mo, then further med refills will have to be denied) ? ?

## 2022-03-22 ENCOUNTER — Other Ambulatory Visit: Payer: Self-pay | Admitting: Internal Medicine

## 2022-03-23 ENCOUNTER — Other Ambulatory Visit: Payer: Self-pay | Admitting: Internal Medicine

## 2022-04-30 ENCOUNTER — Ambulatory Visit: Payer: BC Managed Care – PPO | Admitting: Internal Medicine

## 2022-04-30 VITALS — BP 126/68 | HR 63 | Temp 98.1°F | Ht 67.5 in | Wt 180.0 lb

## 2022-04-30 DIAGNOSIS — E119 Type 2 diabetes mellitus without complications: Secondary | ICD-10-CM | POA: Diagnosis not present

## 2022-04-30 DIAGNOSIS — R9431 Abnormal electrocardiogram [ECG] [EKG]: Secondary | ICD-10-CM

## 2022-04-30 DIAGNOSIS — E538 Deficiency of other specified B group vitamins: Secondary | ICD-10-CM | POA: Diagnosis not present

## 2022-04-30 DIAGNOSIS — I10 Essential (primary) hypertension: Secondary | ICD-10-CM | POA: Diagnosis not present

## 2022-04-30 DIAGNOSIS — R0600 Dyspnea, unspecified: Secondary | ICD-10-CM

## 2022-04-30 DIAGNOSIS — D509 Iron deficiency anemia, unspecified: Secondary | ICD-10-CM | POA: Diagnosis not present

## 2022-04-30 DIAGNOSIS — E7800 Pure hypercholesterolemia, unspecified: Secondary | ICD-10-CM

## 2022-04-30 DIAGNOSIS — E039 Hypothyroidism, unspecified: Secondary | ICD-10-CM

## 2022-04-30 DIAGNOSIS — E559 Vitamin D deficiency, unspecified: Secondary | ICD-10-CM

## 2022-04-30 LAB — BASIC METABOLIC PANEL
BUN: 16 mg/dL (ref 6–23)
CO2: 29 mEq/L (ref 19–32)
Calcium: 10.1 mg/dL (ref 8.4–10.5)
Chloride: 102 mEq/L (ref 96–112)
Creatinine, Ser: 0.8 mg/dL (ref 0.40–1.20)
GFR: 75.65 mL/min (ref >60.00)
Glucose, Bld: 106 mg/dL — ABNORMAL HIGH (ref 70–99)
Potassium: 4.5 mEq/L (ref 3.5–5.1)
Sodium: 138 mEq/L (ref 135–145)

## 2022-04-30 LAB — HEPATIC FUNCTION PANEL
ALT: 12 U/L (ref 0–35)
AST: 17 U/L (ref 0–37)
Albumin: 4 g/dL (ref 3.5–5.2)
Alkaline Phosphatase: 68 U/L (ref 39–117)
Bilirubin, Direct: 0.1 mg/dL (ref 0.0–0.3)
Total Bilirubin: 0.4 mg/dL (ref 0.2–1.2)
Total Protein: 7.7 g/dL (ref 6.0–8.3)

## 2022-04-30 LAB — LIPID PANEL
Cholesterol: 166 mg/dL (ref 0–200)
HDL: 81.8 mg/dL (ref >39.00)
LDL Cholesterol: 72 mg/dL (ref 0–99)
NonHDL: 83.81
Total CHOL/HDL Ratio: 2
Triglycerides: 61 mg/dL (ref 0.0–149.0)
VLDL: 12.2 mg/dL (ref 0.0–40.0)

## 2022-04-30 LAB — CBC WITH DIFFERENTIAL/PLATELET
Basophils Absolute: 0 10*3/uL (ref 0.0–0.1)
Basophils Relative: 0.8 % (ref 0.0–3.0)
Eosinophils Absolute: 0.1 10*3/uL (ref 0.0–0.7)
Eosinophils Relative: 3.4 % (ref 0.0–5.0)
HCT: 30 % — ABNORMAL LOW (ref 36.0–46.0)
Hemoglobin: 10.5 g/dL — ABNORMAL LOW (ref 12.0–15.0)
Lymphocytes Relative: 51.4 % — ABNORMAL HIGH (ref 12.0–46.0)
Lymphs Abs: 2 10*3/uL (ref 0.7–4.0)
MCHC: 35.1 g/dL (ref 30.0–36.0)
MCV: 105.2 fl — ABNORMAL HIGH (ref 78.0–100.0)
Monocytes Absolute: 0.1 10*3/uL (ref 0.1–1.0)
Monocytes Relative: 1.9 % — ABNORMAL LOW (ref 3.0–12.0)
Neutro Abs: 1.7 10*3/uL (ref 1.4–7.7)
Neutrophils Relative %: 42.5 % — ABNORMAL LOW (ref 43.0–77.0)
Platelets: 320 10*3/uL (ref 150.0–400.0)
RBC: 2.85 Mil/uL — ABNORMAL LOW (ref 3.87–5.11)
RDW: 17.8 % — ABNORMAL HIGH (ref 11.5–15.5)
WBC: 4 10*3/uL (ref 4.0–10.5)

## 2022-04-30 LAB — IBC PANEL
Iron: 129 ug/dL (ref 42–145)
Saturation Ratios: 49.8 % (ref 20.0–50.0)
TIBC: 259 ug/dL (ref 250.0–450.0)
Transferrin: 185 mg/dL — ABNORMAL LOW (ref 212.0–360.0)

## 2022-04-30 LAB — VITAMIN B12: Vitamin B-12: >1500 pg/mL — ABNORMAL HIGH (ref 211–911)

## 2022-04-30 LAB — FERRITIN: Ferritin: 166.8 ng/mL (ref 10.0–291.0)

## 2022-04-30 LAB — TSH: TSH: 0.76 u[IU]/mL (ref 0.35–5.50)

## 2022-04-30 LAB — HEMOGLOBIN A1C: Hgb A1c MFr Bld: 6.8 % — ABNORMAL HIGH (ref 4.6–6.5)

## 2022-04-30 NOTE — Progress Notes (Unsigned)
Patient ID: Penny Yates, female   DOB: [birth year], 67 y.o.   MRN: [id number]        Chief Complaint: follow up dyspnea, anemia, recent URI       HPI:  Penny Yates is a 67 y.o. female here at urging of her husband after he mentioned concern for her "panting like a dog" with sob.  Pt states has had recent URI symptoms from 2 wks ago now resolving "on the tail end", but has had mild sob doe. Today actually feels like she is improved finally.  Did also mention finding of mild anemia per GYN in dec 2023 for unclear reason.  Pt states no overt bleeding.  Pt denies chest pain, wheezing, orthopnea, PND, increased LE swelling, palpitations, dizziness or syncope.   Pt denies polydipsia, polyuria, or new focal neuro s/s.    Pt denies fever, wt loss, night sweats, loss of appetite, or other constitutional symptoms  Pt is concerned about heart disease as this runs in her family, willing for Card CT score.         Wt Readings from Last 3 Encounters:  04/30/22 180 lb (81.6 kg)  10/27/21 188 lb 9.6 oz (85.5 kg)  08/14/21 195 lb (88.5 kg)   BP Readings from Last 3 Encounters:  04/30/22 126/68  10/27/21 118/64  08/14/21 (!) 146/74         Past Medical History:  Diagnosis Date   Anemia    Chronic venous insufficiency    LE's   Depression    pt denies   Diabetes mellitus without complication ([place])    no meds  pt. denies states she is pre diabetic    Fracture of shaft of right radius 01/06/2013   History of colonic polyps    multiple of succeeding years 96, 97, 98, and 99   Hyperlipidemia    Hypertension    Hyperthyroidism    s/p rad Iodine-ellison   Hypothyroidism 01/19/2015   Leg length discrepancy    right leg longer   Low back pain    Lymphedema    left arm, both legs; primary lymphedema   Lymphedema    legs   Neuromuscular disorder ([place])    sciatiaca   Osteoporosis    Sacroiliitis ([place])    history of   Past Surgical History:  Procedure Laterality Date   [place]    EYE SURGERY     both eyes-muscle repair  cataract right eye   OPEN REDUCTION INTERNAL FIXATION (ORIF) DISTAL RADIAL FRACTURE Right 01/06/2013   Procedure: RIGHT OPEN REDUCTION INTERNAL FIXATION (ORIF) DISTAL ARTICULATING RADIAL FRACTURE ;  Surgeon: Johnny Bridge, MD;  Location: [facility];  Service: Orthopedics;  Laterality: Right;   REPLACEMENT TOTAL KNEE  june 2007   right   REVISION OF SCAR TISSUE RECTUS MUSCLE  11/08   right knee   TOTAL KNEE REVISION Right 04/13/2020   Procedure: Right knee polyethylene revision.;  Surgeon: Gaynelle Arabian, MD;  Location: [facility];  Service: Orthopedics;  Laterality: Right;  175min   TUBAL LIGATION      reports that she has never smoked. She has never used smokeless tobacco. She reports that she does not drink alcohol and does not use drugs. family history includes Cancer in her mother; Colon cancer (age of onset: 68) in her mother; Diabetes in her mother and another family member; Heart disease in her maternal grandmother; Kidney disease in an other family member; Stroke in an other  family member. Allergies  Allergen Reactions   Ace Inhibitors Cough   Lisinopril Other (See Comments)    Cough   Current Outpatient Medications on File Prior to Visit  Medication Sig Dispense Refill   alendronate (FOSAMAX) 70 MG tablet TAKE 1 TABLET BY MOUTH EVERY 7 DAYS. TAKE WITH A FULL GLASS OF WATER ON AN EMPTY STOMACH. 12 tablet 3   ALPHAGAN P 0.1 % SOLN Place 1 drop into both eyes in the morning and at bedtime.   11   amitriptyline (ELAVIL) 100 MG tablet TAKE 1/2 TO 1 TABLET BY MOUTH AT BEDTIME AS NEEDED 90 tablet 1   amoxicillin (AMOXIL) 500 MG capsule Take 2 capsules (1,000 mg total) by mouth See admin instructions. Take 2 capsules by mouth 1 hr prior to dental procedure, then 2 capsules 4 hrs after 4 capsule 2   ascorbic acid (VITAMIN C) 500 MG tablet Take 500 mg by mouth daily.     atorvastatin (LIPITOR) 10 MG tablet TAKE 1 TABLET BY MOUTH EVERY  DAY 90 tablet 2   Biotin 5000 MCG CAPS Take 5,000 mcg by mouth daily.     cholecalciferol (VITAMIN D3) 25 MCG (1000 UNIT) tablet Take 1,000 Units by mouth daily.     diclofenac Sodium (VOLTAREN) 1 % GEL Apply 2 g topically 4 (four) times daily as needed (pain).      diltiazem (CARDIZEM CD) 360 MG 24 hr capsule TAKE 1 CAPSULE BY MOUTH EVERY DAY 90 capsule 3   dorzolamide-timolol (COSOPT) 22.3-6.8 MG/ML ophthalmic solution Place 1 drop into both eyes 2 (two) times daily.      DUREZOL 0.05 % EMUL Place 1 drop into the right eye every other day.      fish oil-omega-3 fatty acids 1000 MG capsule Take 1 g by mouth daily.     furosemide (LASIX) 40 MG tablet Take 1 tablet (40 mg total) by mouth daily as needed for edema. 90 tablet 3   guanFACINE (TENEX) 2 MG tablet TAKE 1 TABLET BY MOUTH EVERYDAY AT BEDTIME 90 tablet 2   hydrALAZINE (APRESOLINE) 100 MG tablet Take 1 tablet (100 mg total) by mouth 2 (two) times daily. 180 tablet 3   irbesartan (AVAPRO) 300 MG tablet TAKE 1 TABLET BY MOUTH EVERY DAY 90 tablet 3   latanoprost (XALATAN) 0.005 % ophthalmic solution Place 1 drop into both eyes at bedtime.  4   levothyroxine (SYNTHROID) 100 MCG tablet TAKE 1 TABLET BY MOUTH EVERY DAY 90 tablet 3   Multiple Vitamin (MULTIVITAMIN) capsule Take 1 capsule by mouth daily.     potassium chloride (KLOR-CON M10) 10 MEQ tablet TAKE 1 TABLET BY MOUTH EVERY DAY 90 tablet 2   Probiotic Product (PHILLIPS COLON HEALTH PO) Take 1 capsule by mouth daily.      traMADol (ULTRAM-ER) 100 MG 24 hr tablet TAKE 1 TABLET BY MOUTH EVERY DAY 30 tablet 5   No current facility-administered medications on file prior to visit.        ROS:  All others reviewed and negative.  Objective        PE:  BP 126/68 (BP Location: Right Arm, Patient Position: Sitting, Cuff Size: Large)   Pulse 63   Temp 98.1 F (36.7 C) (Oral)   Ht 5' 7.5" (1.715 m)   Wt 180 lb (81.6 kg)   SpO2 98%   BMI 27.78 kg/m                 Constitutional: Pt  appears in  NAD, non toxic, fatigued               HENT: Head: NCAT.                Right Ear: External ear normal.                 Left Ear: External ear normal. Bilat tm's with mild erythema.  Max sinus areas non tender.  Pharynx with mild erythema, no exudate               Eyes: . Pupils are equal, round, and reactive to light. Conjunctivae and EOM are normal               Nose: without d/c or deformity               Neck: Neck supple. Gross normal ROM               Cardiovascular: Normal rate and regular rhythm.                 Pulmonary/Chest: Effort normal and breath sounds without rales or wheezing.                Abd:  Soft, NT, ND, + BS, no organomegaly               Neurological: Pt is alert. At baseline orientation, motor grossly intact               Skin: Skin is warm. No rashes, no other new lesions, LE edema - none               Psychiatric: Pt behavior is normal without agitation   Micro: none  Cardiac tracings I have personally interpreted today:  none  Pertinent Radiological findings (summarize): none   Lab Results  Component Value Date   WBC 4.0 04/30/2022   HGB 10.5 (L) 04/30/2022   HCT 30.0 (L) 04/30/2022   PLT 320.0 04/30/2022   GLUCOSE 106 (H) 04/30/2022   CHOL 166 04/30/2022   TRIG 61.0 04/30/2022   HDL 81.80 04/30/2022   LDLDIRECT 131.2 10/27/2008   LDLCALC 72 04/30/2022   ALT 12 04/30/2022   AST 17 04/30/2022   NA 138 04/30/2022   K 4.5 04/30/2022   CL 102 04/30/2022   CREATININE 0.80 04/30/2022   BUN 16 04/30/2022   CO2 29 04/30/2022   TSH 0.76 04/30/2022   INR 1.0 04/06/2020   HGBA1C 6.8 (H) 04/30/2022   MICROALBUR 2.1 (H) 10/27/2021   Assessment/Plan:  CLAIRE DOLORES is a 67 y.o. Black or African American [2] female with  has a past medical history of Anemia, Chronic venous insufficiency, Depression, Diabetes mellitus without complication (HCC), Fracture of shaft of right radius (01/06/2013), History of colonic polyps, Hyperlipidemia,  Hypertension, Hyperthyroidism, Hypothyroidism (01/19/2015), Leg length discrepancy, Low back pain, Lymphedema, Lymphedema, Neuromuscular disorder (HCC), Osteoporosis, and Sacroiliitis (HCC).  ANEMIA-IRON DEFICIENCY With ? Recent worsening anemia per pt last month, for f/u lab today and iron levels  Diabetes mellitus with coincident hypertension (HCC) Lab Results  Component Value Date   HGBA1C 6.8 (H) 04/30/2022   Stable, pt to continue current medical treatment   - diet, wt control   Dyspnea Etiology unclear but with recent URI with sob doe now resolving, probable viral illness, now improved and near resolved, exam benign, declines CXR but will have labs as ordered including cbc; also for Card CT score  Essential hypertension BP Readings from Last  3 Encounters:  04/30/22 126/68  10/27/21 118/64  08/14/21 (!) 146/74   Stable, pt to continue medical treatment cardizem CD 360 mg qd   Hyperlipidemia Lab Results  Component Value Date   LDLCALC 72 04/30/2022   Stable, pt to continue current statin lipitor 10 mg qd   Hypothyroidism Lab Results  Component Value Date   TSH 0.76 04/30/2022   Stable, pt to continue levothyroxine 100 mcg qd  Followup: Return if symptoms worsen or fail to improve.  Oliver Barre, MD 05/01/2022 4:43 AM [facility] Internal Medicine

## 2022-04-30 NOTE — Patient Instructions (Signed)
Please continue all other medications as before, and refills have been done if requested.  Please have the pharmacy call with any other refills you may need.  Please continue your efforts at being more active, low cholesterol diet, and weight control.  You are otherwise up to date with prevention measures today.  Please keep your appointments with your specialists as you may have planned  You will be contacted regarding the referral for:  Card CT Score  Please go to the LAB at the blood drawing area for the tests to be done  You will be contacted by phone if any changes need to be made immediately.  Otherwise, you will receive a letter about your results with an explanation, but please check with MyChart first.  Please remember to sign up for MyChart if you have not done so, as this will be important to you in the future with finding out test results, communicating by private email, and scheduling acute appointments online when needed.

## 2022-05-01 ENCOUNTER — Encounter: Payer: Self-pay | Admitting: Internal Medicine

## 2022-05-01 NOTE — Assessment & Plan Note (Signed)
With ? Recent worsening anemia per pt last month, for f/u lab today and iron levels

## 2022-05-01 NOTE — Assessment & Plan Note (Signed)
Lab Results  Component Value Date   TSH 0.76 04/30/2022   Stable, pt to continue levothyroxine 100 mcg qd

## 2022-05-01 NOTE — Assessment & Plan Note (Signed)
Lab Results  Component Value Date   LDLCALC 72 04/30/2022   Stable, pt to continue current statin lipitor 10 mg qd

## 2022-05-01 NOTE — Assessment & Plan Note (Signed)
Lab Results  Component Value Date   HGBA1C 6.8 (H) 04/30/2022   Stable, pt to continue current medical treatment - diet, wt control  

## 2022-05-01 NOTE — Assessment & Plan Note (Signed)
BP Readings from Last 3 Encounters:  04/30/22 126/68  10/27/21 118/64  08/14/21 (!) 146/74   Stable, pt to continue medical treatment cardizem CD 360 mg qd

## 2022-05-01 NOTE — Assessment & Plan Note (Signed)
Etiology unclear but with recent URI with sob doe now resolving, probable viral illness, now improved and near resolved, exam benign, declines CXR but will have labs as ordered including cbc; also for Card CT score

## 2022-05-08 ENCOUNTER — Ambulatory Visit (HOSPITAL_BASED_OUTPATIENT_CLINIC_OR_DEPARTMENT_OTHER)
Admission: RE | Admit: 2022-05-08 | Discharge: 2022-05-08 | Disposition: A | Discharge status: Home / Self Care | Payer: BC Managed Care – PPO | Source: Ambulatory Visit | Attending: Internal Medicine | Admitting: Internal Medicine

## 2022-05-08 ENCOUNTER — Encounter (HOSPITAL_BASED_OUTPATIENT_CLINIC_OR_DEPARTMENT_OTHER): Payer: Self-pay

## 2022-05-08 DIAGNOSIS — R9431 Abnormal electrocardiogram [ECG] [EKG]: Secondary | ICD-10-CM

## 2022-05-08 DIAGNOSIS — E7800 Pure hypercholesterolemia, unspecified: Secondary | ICD-10-CM | POA: Insufficient documentation

## 2022-05-08 DIAGNOSIS — I10 Essential (primary) hypertension: Secondary | ICD-10-CM

## 2022-05-08 DIAGNOSIS — E119 Type 2 diabetes mellitus without complications: Secondary | ICD-10-CM | POA: Insufficient documentation

## 2022-05-22 ENCOUNTER — Encounter: Payer: Self-pay | Admitting: Internal Medicine

## 2022-05-22 DIAGNOSIS — H9203 Otalgia, bilateral: Secondary | ICD-10-CM

## 2022-05-22 DIAGNOSIS — H9193 Unspecified hearing loss, bilateral: Secondary | ICD-10-CM

## 2022-05-22 NOTE — Telephone Encounter (Signed)
See below

## 2022-05-22 NOTE — Telephone Encounter (Signed)
Per patient message,"I think both of my ears are infected and would like to get an appointment with an ENT specialist as soon as possible because it's very painful, experiencing drainage from both ears and my hearing is impaired."

## 2022-05-23 ENCOUNTER — Encounter: Payer: Self-pay | Admitting: Internal Medicine

## 2022-05-23 ENCOUNTER — Ambulatory Visit: Payer: BC Managed Care – PPO | Admitting: Internal Medicine

## 2022-05-23 ENCOUNTER — Other Ambulatory Visit: Payer: Self-pay | Admitting: Internal Medicine

## 2022-05-23 VITALS — BP 108/74 | HR 90 | Temp 98.2°F | Ht 67.5 in | Wt 178.5 lb

## 2022-05-23 DIAGNOSIS — I10 Essential (primary) hypertension: Secondary | ICD-10-CM

## 2022-05-23 DIAGNOSIS — H60503 Unspecified acute noninfective otitis externa, bilateral: Secondary | ICD-10-CM | POA: Diagnosis not present

## 2022-05-23 DIAGNOSIS — E119 Type 2 diabetes mellitus without complications: Secondary | ICD-10-CM | POA: Diagnosis not present

## 2022-05-23 DIAGNOSIS — F32A Depression, unspecified: Secondary | ICD-10-CM | POA: Diagnosis not present

## 2022-05-23 DIAGNOSIS — H6093 Unspecified otitis externa, bilateral: Secondary | ICD-10-CM | POA: Insufficient documentation

## 2022-05-23 MED ORDER — LEVOFLOXACIN 500 MG PO TABS: 500.0000 mg | ORAL_TABLET | Freq: Every day | ORAL | 0 refills | Status: AC

## 2022-05-23 NOTE — Assessment & Plan Note (Signed)
Lab Results  Component Value Date   HGBA1C 6.8 (H) 04/30/2022   Stable, pt to continue current medical treatment - diet, wt control

## 2022-05-23 NOTE — Assessment & Plan Note (Signed)
BP Readings from Last 3 Encounters:  05/23/22 108/74  04/30/22 126/68  10/27/21 118/64   Stable, pt to continue medical treatment cardizem cd 360, hyddralazine 100 bid, avapro 300 qd

## 2022-05-23 NOTE — Assessment & Plan Note (Signed)
Stable, declines need for further tx or counseing

## 2022-05-23 NOTE — Assessment & Plan Note (Signed)
Mild to mod, for antibx course levaquin 500 qd,  to f/u any worsening symptoms or concerns 

## 2022-05-23 NOTE — Telephone Encounter (Signed)
Done erx 

## 2022-05-23 NOTE — Patient Instructions (Signed)
Please take all new medication as prescribed - the antibiotic  You are given the work note  Please continue all other medications as before, and refills have been done if requested.  Please have the pharmacy call with any other refills you may need.  Please keep your appointments with your specialists as you may have planned    

## 2022-05-23 NOTE — Progress Notes (Signed)
Patient ID: Penny Yates, female   DOB: 04-02-1956, 67 y.o.   MRN: [id number]        Chief Complaint: follow up bilateral ear pain left > right, htn, depression       HPI:  Penny Yates is a 67 y.o. female here with bilateral ear pain acute mild to mod, left > right with drainage from the left, feeling warm but no high fever, chills, reduced hearing, vertigo or wax impactions.  Pt denies chest pain, increased sob or doe, wheezing, orthopnea, PND, increased LE swelling, palpitations, dizziness or syncope.   Pt denies polydipsia, polyuria, or new focal neuro s/s.   Denies worsening depressive symptoms, suicidal ideation, or panic       Wt Readings from Last 3 Encounters:  05/23/22 178 lb 8 oz (81 kg)  04/30/22 180 lb (81.6 kg)  10/27/21 188 lb 9.6 oz (85.5 kg)   BP Readings from Last 3 Encounters:  05/23/22 108/74  04/30/22 126/68  10/27/21 118/64         Past Medical History:  Diagnosis Date   Anemia    Chronic venous insufficiency    LE's   Depression    pt denies   Diabetes mellitus without complication ([place])    no meds  pt. denies states she is pre diabetic    Fracture of shaft of right radius 01/06/2013   History of colonic polyps    multiple of succeeding years 96, 97, 98, and 99   Hyperlipidemia    Hypertension    Hyperthyroidism    s/p rad Iodine-ellison   Hypothyroidism 01/19/2015   Leg length discrepancy    right leg longer   Low back pain    Lymphedema    left arm, both legs; primary lymphedema   Lymphedema    legs   Neuromuscular disorder ([place])    sciatiaca   Osteoporosis    Sacroiliitis ([place])    history of   Past Surgical History:  Procedure Laterality Date   [place]   EYE SURGERY     both eyes-muscle repair  cataract right eye   OPEN REDUCTION INTERNAL FIXATION (ORIF) DISTAL RADIAL FRACTURE Right 01/06/2013   Procedure: RIGHT OPEN REDUCTION INTERNAL FIXATION (ORIF) DISTAL ARTICULATING RADIAL FRACTURE ;  Surgeon: Johnny Bridge, MD;   Location: [facility] [facility];  Service: Orthopedics;  Laterality: Right;   REPLACEMENT TOTAL KNEE  june 2007   right   REVISION OF SCAR TISSUE RECTUS MUSCLE  11/08   right knee   TOTAL KNEE REVISION Right 04/13/2020   Procedure: Right knee polyethylene revision.;  Surgeon: Gaynelle Arabian, MD;  Location: [facility];  Service: Orthopedics;  Laterality: Right;  149min   TUBAL LIGATION      reports that she has never smoked. She has never used smokeless tobacco. She reports that she does not drink alcohol and does not use drugs. family history includes Cancer in her mother; Colon cancer (age of onset: 12) in her mother; Diabetes in her mother and another family member; Heart disease in her maternal grandmother; Kidney disease in an other family member; Stroke in an other family member. Allergies  Allergen Reactions   Ace Inhibitors Cough   Lisinopril Other (See Comments)    Cough   Current Outpatient Medications on File Prior to Visit  Medication Sig Dispense Refill   alendronate (FOSAMAX) 70 MG tablet TAKE 1 TABLET BY MOUTH EVERY 7 DAYS. TAKE WITH A FULL GLASS OF WATER ON AN  EMPTY STOMACH. 12 tablet 3   ALPHAGAN P 0.1 % SOLN Place 1 drop into both eyes in the morning and at bedtime.   11   amitriptyline (ELAVIL) 100 MG tablet TAKE 1/2 TO 1 TABLET BY MOUTH AT BEDTIME AS NEEDED 90 tablet 1   amoxicillin (AMOXIL) 500 MG capsule Take 2 capsules (1,000 mg total) by mouth See admin instructions. Take 2 capsules by mouth 1 hr prior to dental procedure, then 2 capsules 4 hrs after 4 capsule 2   ascorbic acid (VITAMIN C) 500 MG tablet Take 500 mg by mouth daily.     atorvastatin (LIPITOR) 10 MG tablet TAKE 1 TABLET BY MOUTH EVERY DAY 90 tablet 2   Biotin 5000 MCG CAPS Take 5,000 mcg by mouth daily.     cholecalciferol (VITAMIN D3) 25 MCG (1000 UNIT) tablet Take 1,000 Units by mouth daily.     diclofenac Sodium (VOLTAREN) 1 % GEL Apply 2 g topically 4 (four) times daily as needed (pain).       diltiazem (CARDIZEM CD) 360 MG 24 hr capsule TAKE 1 CAPSULE BY MOUTH EVERY DAY 90 capsule 3   dorzolamide-timolol (COSOPT) 22.3-6.8 MG/ML ophthalmic solution Place 1 drop into both eyes 2 (two) times daily.      DUREZOL 0.05 % EMUL Place 1 drop into the right eye every other day.      fish oil-omega-3 fatty acids 1000 MG capsule Take 1 g by mouth daily.     furosemide (LASIX) 40 MG tablet Take 1 tablet (40 mg total) by mouth daily as needed for edema. 90 tablet 3   guanFACINE (TENEX) 2 MG tablet TAKE 1 TABLET BY MOUTH EVERYDAY AT BEDTIME 90 tablet 2   hydrALAZINE (APRESOLINE) 100 MG tablet Take 1 tablet (100 mg total) by mouth 2 (two) times daily. 180 tablet 3   irbesartan (AVAPRO) 300 MG tablet TAKE 1 TABLET BY MOUTH EVERY DAY 90 tablet 3   latanoprost (XALATAN) 0.005 % ophthalmic solution Place 1 drop into both eyes at bedtime.  4   levothyroxine (SYNTHROID) 100 MCG tablet TAKE 1 TABLET BY MOUTH EVERY DAY 90 tablet 3   Multiple Vitamin (MULTIVITAMIN) capsule Take 1 capsule by mouth daily.     potassium chloride (KLOR-CON M10) 10 MEQ tablet TAKE 1 TABLET BY MOUTH EVERY DAY 90 tablet 2   Probiotic Product ([facility] PO) Take 1 capsule by mouth daily.      No current facility-administered medications on file prior to visit.        ROS:  All others reviewed and negative.  Objective        PE:  BP 108/74   Pulse 90   Temp 98.2 F (36.8 C) (Temporal)   Ht 5' 7.5" (1.715 m)   Wt 178 lb 8 oz (81 kg)   SpO2 98%   BMI 27.54 kg/m                 Constitutional: Pt appears in NAD               HENT: Head: NCAT.                Right Ear: External ear normal.                 Left Ear: External ear normal.  Bilat ext canals with 1-2+ red, swelling, tedner and small left discharge               Eyes: .  Pupils are equal, round, and reactive to light. Conjunctivae and EOM are normal               Nose: without d/c or deformity               Neck: Neck supple. Gross normal ROM                Cardiovascular: Normal rate and regular rhythm.                 Pulmonary/Chest: Effort normal and breath sounds without rales or wheezing.                               Neurological: Pt is alert. At baseline orientation, motor grossly intact               Skin: Skin is warm. No rashes, no other new lesions, LE edema - none               Psychiatric: Pt behavior is normal without agitation   Micro: none  Cardiac tracings I have personally interpreted today:  none  Pertinent Radiological findings (summarize): none   Lab Results  Component Value Date   WBC 4.0 04/30/2022   HGB 10.5 (L) 04/30/2022   HCT 30.0 (L) 04/30/2022   PLT 320.0 04/30/2022   GLUCOSE 106 (H) 04/30/2022   CHOL 166 04/30/2022   TRIG 61.0 04/30/2022   HDL 81.80 04/30/2022   LDLDIRECT 131.2 10/27/2008   LDLCALC 72 04/30/2022   ALT 12 04/30/2022   AST 17 04/30/2022   NA 138 04/30/2022   K 4.5 04/30/2022   CL 102 04/30/2022   CREATININE 0.80 04/30/2022   BUN 16 04/30/2022   CO2 29 04/30/2022   TSH 0.76 04/30/2022   INR 1.0 04/06/2020   HGBA1C 6.8 (H) 04/30/2022   MICROALBUR 2.1 (H) 10/27/2021   Assessment/Plan:  Penny Yates is a 67 y.o. Black or African American [2] female with  has a past medical history of Anemia, Chronic venous insufficiency, Depression, Diabetes mellitus without complication ([place]), Fracture of shaft of right radius (01/06/2013), History of colonic polyps, Hyperlipidemia, Hypertension, Hyperthyroidism, Hypothyroidism (01/19/2015), Leg length discrepancy, Low back pain, Lymphedema, Lymphedema, Neuromuscular disorder ([place]), Osteoporosis, and Sacroiliitis ([place]).  Depression Stable, declines need for further tx or counseing  Diabetes mellitus with coincident hypertension ([place]) Lab Results  Component Value Date   HGBA1C 6.8 (H) 04/30/2022   Stable, pt to continue current medical treatment - diet, wt control   Essential hypertension BP Readings from Last 3 Encounters:  05/23/22  108/74  04/30/22 126/68  10/27/21 118/64   Stable, pt to continue medical treatment cardizem cd 360, hyddralazine 100 bid, avapro 300 qd   Bilateral otitis externa Mild to mod, for antibx course levaquin 500 qd,  to f/u any worsening symptoms or concerns  Followup: Return if symptoms worsen or fail to improve.  Cathlean Cower, MD 05/23/2022 7:36 PM [facility]

## 2022-05-25 ENCOUNTER — Encounter: Payer: Self-pay | Admitting: Internal Medicine

## 2022-05-25 MED ORDER — AMOXICILLIN-POT CLAVULANATE 875-125 MG PO TABS: 1.0000 | ORAL_TABLET | Freq: Two times a day (BID) | ORAL | 0 refills | Status: DC

## 2022-05-25 NOTE — Telephone Encounter (Signed)
Ok this was changed

## 2022-05-25 NOTE — Telephone Encounter (Signed)
Patient reports reaction to Levofloxacin, please send in another abx for the ear infection.

## 2022-06-08 ENCOUNTER — Ambulatory Visit
Admission: RE | Admit: 2022-06-08 | Discharge: 2022-06-08 | Disposition: A | Discharge status: Home / Self Care | Payer: BC Managed Care – PPO | Source: Ambulatory Visit | Attending: Emergency Medicine | Admitting: Emergency Medicine

## 2022-06-08 VITALS — BP 145/84 | HR 83 | Temp 98.6°F | Resp 16 | Ht 67.5 in | Wt 178.0 lb

## 2022-06-08 DIAGNOSIS — H66002 Acute suppurative otitis media without spontaneous rupture of ear drum, left ear: Secondary | ICD-10-CM

## 2022-06-08 DIAGNOSIS — H60503 Unspecified acute noninfective otitis externa, bilateral: Secondary | ICD-10-CM

## 2022-06-08 MED ORDER — NEOMYCIN-POLYMYXIN-HC 3.5-10000-1 OT SUSP: 4.0000 [drp] | Freq: Two times a day (BID) | OTIC | 0 refills | Status: AC

## 2022-06-08 MED ORDER — CETIRIZINE HCL 10 MG PO TABS: 5.0000 mg | ORAL_TABLET | Freq: Every day | ORAL | 2 refills | Status: AC

## 2022-06-08 NOTE — Discharge Instructions (Addendum)
For ear infection: Please take the AUGMENTIN as prescribed by your primary care provider. Finish the full course of medicine.  Start the CORTISPORIN ear drops. Twice daily for 5 days  For itching/rash: Start once daily ZYRTEC, half a tablet Take BENADRYL 25 mg twice daily  Follow with the ear specialists at your visit this month

## 2022-06-08 NOTE — ED Provider Notes (Signed)
[facility]    CSN: [id number] Arrival date & time: 06/08/22  1330     History   Chief Complaint Chief Complaint  Patient presents with   Otalgia    bilateral    HPI Penny Yates is a 67 y.o. female.  Presents with bilateral ear pain x 2 weeks Today pain 4/10 Seen by PCP and started on levaquin, however developed itchy rash and stopped after one dose. Was switched to augmentin BID x 10 days, advised to take benadryl. She picked up the medication but did not start it. Ears are still painful. Still has a little rash - tried topical creams. Has been scratching at chest in her sleep. Has ENT appointment in 2 weeks  Reports PCP didn't put her on ear drops because her canals were too swollen   Past Medical History:  Diagnosis Date   Anemia    Chronic venous insufficiency    LE's   Depression    pt denies   Diabetes mellitus without complication ([place])    no meds  pt. denies states she is pre diabetic    Fracture of shaft of right radius 01/06/2013   History of colonic polyps    multiple of succeeding years 96, 97, 98, and 99   Hyperlipidemia    Hypertension    Hyperthyroidism    s/p rad Iodine-ellison   Hypothyroidism 01/19/2015   Leg length discrepancy    right leg longer   Low back pain    Lymphedema    left arm, both legs; primary lymphedema   Lymphedema    legs   Neuromuscular disorder ([facility])    sciatiaca   Osteoporosis    Sacroiliitis ([place])    history of    Patient Active Problem List   Diagnosis Date Noted   Bilateral otitis externa 05/23/2022   Dyspnea 04/30/2022   Atrophic vaginitis 10/27/2021   Peripheral neuropathy 10/27/2021   Anemia 10/27/2021   Bilateral leg pain 12/29/2020   Spinal stenosis of lumbar region 07/13/2020   Bacterial vaginosis 05/11/2020   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   Preoperative cardiovascular examination 03/01/2020   Diabetes mellitus  with coincident hypertension ([place]) 03/01/2020   Abnormal urinalysis 02/08/2020   Renal cyst 02/08/2020   Perennial allergic rhinitis 08/01/2016   Sensorineural hearing loss (SNHL), bilateral 08/01/2016   Hyperkalemia 06/26/2016   Acquired lymphedema of leg 06/26/2016   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   Primary open angle glaucoma of both eyes, mild stage 02/06/2016   Leg length discrepancy 07/13/2015   Pain due to knee joint prosthesis ([place]) 07/13/2015   Headache 06/28/2015   Hypothyroidism 01/19/2015   Right flank pain 01/14/2015   Fracture of shaft of right radius 01/06/2013   Total knee replacement status 08/25/2012   Palpitations 02/12/2012   Encounter for well adult exam with abnormal findings 01/05/2011   Hypopotassemia 10/18/2010   Lymphedema 07/26/2010   SYNCOPE, HX OF 07/03/2010   Osteoporosis 11/01/2009   Disorder of skeletal system 11/01/2009   Bilateral hearing loss 06/17/2009   Depression 01/07/2009   FATIGUE 01/07/2009   OBSTRUCTIVE SLEEP APNEA 11/17/2008   HYPERSOMNIA 10/27/2008   DIVERTICULOSIS, COLON 06/04/2008   KNEE PAIN, RIGHT 01/27/2008   BACK PAIN 01/27/2008   GOITER, MULTINODULAR 10/08/2007   HYPERTHYROIDISM 10/08/2007   MENOPAUSAL SYNDROME 10/08/2007   Hyperlipidemia 09/19/2007   ANEMIA-IRON DEFICIENCY 09/19/2007   Essential hypertension 09/19/2007   LOW BACK PAIN 09/19/2007  WEIGHT LOSS 09/19/2007   COLONIC POLYPS, HX OF 09/19/2007   Lymphedema of both lower extremities 04/23/2000    Past Surgical History:  Procedure Laterality Date   [place]   EYE SURGERY     both eyes-muscle repair  cataract right eye   OPEN REDUCTION INTERNAL FIXATION (ORIF) DISTAL RADIAL FRACTURE Right 01/06/2013   Procedure: RIGHT OPEN REDUCTION INTERNAL FIXATION (ORIF) DISTAL ARTICULATING RADIAL FRACTURE ;  Surgeon: Johnny Bridge, MD;  Location: [place] [facility];  Service: Orthopedics;  Laterality: Right;   REPLACEMENT TOTAL KNEE   june 2007   right   REVISION OF SCAR TISSUE RECTUS MUSCLE  11/08   right knee   TOTAL KNEE REVISION Right 04/13/2020   Procedure: Right knee polyethylene revision.;  Surgeon: Gaynelle Arabian, MD;  Location[facility];  Service: Orthopedics;  Laterality: Right;  125mn   TUBAL LIGATION      OB History   No obstetric history on file.      Home Medications    Prior to Admission medications   Medication Sig Start Date End Date Taking? Authorizing Provider  cetirizine (ZYRTEC ALLERGY) 10 MG tablet Take 0.5 tablets (5 mg total) by mouth daily. 06/08/22  Yes Jamaul Heist, RWells Guiles PA-C  neomycin-polymyxin-hydrocortisone (CORTISPORIN) 3.5-10000-1 OTIC suspension Place 4 drops into both ears 2 (two) times daily for 5 days. 06/08/22 06/13/22 Yes Caylyn Tedeschi, RWells Guiles PA-C  alendronate (FOSAMAX) 70 MG tablet TAKE 1 TABLET BY MOUTH EVERY 7 DAYS. TAKE WITH A FULL GLASS OF WATER ON AN EMPTY STOMACH. 11/21/21   JBiagio Borg MD  ALPHAGAN P 0.1 % SOLN Place 1 drop into both eyes in the morning and at bedtime.  05/31/16   [provider]  amitriptyline (ELAVIL) 100 MG tablet TAKE 1/2 TO 1 TABLET BY MOUTH AT BEDTIME AS NEEDED 03/23/22   JBiagio Borg MD  amoxicillin (AMOXIL) 500 MG capsule Take 2 capsules (1,000 mg total) by mouth See admin instructions. Take 2 capsules by mouth 1 hr prior to dental procedure, then 2 capsules 4 hrs after 10/20/21   JBiagio Borg MD  ascorbic acid (VITAMIN C) 500 MG tablet Take 500 mg by mouth daily.    [provider]  atorvastatin (LIPITOR) 10 MG tablet TAKE 1 TABLET BY MOUTH EVERY DAY 10/25/21   JBiagio Borg MD  Biotin 5000 MCG CAPS Take 5,000 mcg by mouth daily.    [provider]  cholecalciferol (VITAMIN D3) 25 MCG (1000 UNIT) tablet Take 1,000 Units by mouth daily.    [provider]  diclofenac Sodium (VOLTAREN) 1 % GEL Apply 2 g topically 4 (four) times daily as needed (pain).     [provider]  diltiazem (CARDIZEM CD) 360 MG 24 hr  capsule TAKE 1 CAPSULE BY MOUTH EVERY DAY 01/08/22   JBiagio Borg MD  dorzolamide-timolol (COSOPT) 22.3-6.8 MG/ML ophthalmic solution Place 1 drop into both eyes 2 (two) times daily.     [provider]  fish oil-omega-3 fatty acids 1000 MG capsule Take 1 g by mouth daily.    [provider]  furosemide (LASIX) 40 MG tablet Take 1 tablet (40 mg total) by mouth daily as needed for edema. 10/26/20   JBiagio Borg MD  guanFACINE (TENEX) 2 MG tablet TAKE 1 TABLET BY MOUTH EVERYDAY AT BEDTIME 10/25/21   JBiagio Borg MD  hydrALAZINE (APRESOLINE) 100 MG tablet Take 1 tablet (100 mg total) by mouth 2 (two) times daily. 12/29/20  Biagio Borg, MD  irbesartan (AVAPRO) 300 MG tablet TAKE 1 TABLET BY MOUTH EVERY DAY 11/21/21   Biagio Borg, MD  latanoprost (XALATAN) 0.005 % ophthalmic solution Place 1 drop into both eyes at bedtime. 09/12/17   [provider]  levothyroxine (SYNTHROID) 100 MCG tablet TAKE 1 TABLET BY MOUTH EVERY DAY 10/30/21   Biagio Borg, MD  Multiple Vitamin (MULTIVITAMIN) capsule Take 1 capsule by mouth daily.    [provider]  potassium chloride (KLOR-CON M10) 10 MEQ tablet TAKE 1 TABLET BY MOUTH EVERY DAY 10/25/21   Biagio Borg, MD  Probiotic Product ([place] PO) Take 1 capsule by mouth daily.     [provider]  traMADol (ULTRAM-ER) 100 MG 24 hr tablet TAKE 1 TABLET BY MOUTH EVERY DAY 05/23/22   Biagio Borg, MD    Family History Family History  Problem Relation Age of Onset   Colon cancer Mother 72   Diabetes Mother    Cancer Mother        Colon Cancer   Heart disease Maternal Grandmother    Diabetes Other        mother and several others in family   Stroke Other    Kidney disease Other    Thyroid disease Neg Hx    Esophageal cancer Neg Hx    Rectal cancer Neg Hx    Stomach cancer Neg Hx     Social History Social History   Tobacco Use   Smoking status: Never   Smokeless tobacco: Never  Vaping Use   Vaping  Use: Never used  Substance Use Topics   Alcohol use: No   Drug use: No     Allergies   Ace inhibitors, Levaquin [levofloxacin], and Lisinopril   Review of Systems Review of Systems  HENT:  Positive for ear pain.    As per HPI  Physical Exam Triage Vital Signs ED Triage Vitals  Enc Vitals Group     BP      Pulse      Resp      Temp      Temp src      SpO2      Weight      Height      Head Circumference      Peak Flow      Pain Score      Pain Loc      Pain Edu?      Excl. in [place]?    No data found.  Updated Vital Signs BP (!) 145/84 (BP Location: Right Arm)   Pulse 83   Temp 98.6 F (37 C) (Oral)   Resp 16   Ht 5' 7.5" (1.715 m)   Wt 178 lb (80.7 kg)   SpO2 99%   BMI 27.47 kg/m    Physical Exam Vitals and nursing note reviewed.  Constitutional:      General: She is not in acute distress. HENT:     Right Ear: External ear normal. Swelling and tenderness present.     Left Ear: External ear normal. Swelling and tenderness present. A middle ear effusion is present.     Ears:     Comments: Swelling of bilat canals but I am able to visualize TMs. LEFT with cloudy fluid    Nose: No congestion.     Mouth/Throat:     Mouth: Mucous membranes are moist.     Pharynx: Oropharynx is clear.  Cardiovascular:  Rate and Rhythm: Normal rate and regular rhythm.     Heart sounds: Normal heart sounds.  Pulmonary:     Effort: Pulmonary effort is normal.     Breath sounds: Normal breath sounds.  Neurological:     Mental Status: She is alert and oriented to person, place, and time.     UC Treatments / Results  Labs (all labs ordered are listed, but only abnormal results are displayed) Labs Reviewed - No data to display  EKG  Radiology No results found.  Procedures Procedures   Medications Ordered in UC Medications - No data to display  Initial Impression / Assessment and Plan / UC Course  I have reviewed the triage vital signs and the nursing  notes.  Pertinent labs & imaging results that were available during my care of the patient were reviewed by me and considered in my medical decision making (see chart for details).  Patient was afraid to have rash with augmentin. She has taken this in the past without problems - discussed her rash is an allergy to the levaquin specifically and should be fine to take augmentin. She will begin course as prescribed by PCP. Will add cortisporin ear drops BID x 5 days for AOE Also recommend oral benadryl, daily 5 mg zyrtec for itching. Will follow with ENT in 2 weeks Return precautions discussed. Patient agrees to plan  Final Clinical Impressions(s) / UC Diagnoses   Final diagnoses:  Acute otitis externa of both ears, unspecified type  Acute suppurative otitis media of left ear without spontaneous rupture of tympanic membrane, recurrence not specified     Discharge Instructions      For ear infection: Please take the AUGMENTIN as prescribed by your primary care provider. Finish the full course of medicine.  Start the CORTISPORIN ear drops. Twice daily for 5 days  For itching/rash: Start once daily ZYRTEC, half a tablet Take BENADRYL 25 mg twice daily  Follow with the ear specialists at your visit this month     ED Prescriptions     Medication Sig Dispense Auth. Provider   neomycin-polymyxin-hydrocortisone (CORTISPORIN) 3.5-10000-1 OTIC suspension Place 4 drops into both ears 2 (two) times daily for 5 days. 10 mL Netty Sullivant, PA-C   cetirizine (ZYRTEC ALLERGY) 10 MG tablet Take 0.5 tablets (5 mg total) by mouth daily. 30 tablet Norvel Wenker, Wells Guiles, PA-C      PDMP not reviewed this encounter.   Les Pou, [place] 06/08/22 1430

## 2022-06-08 NOTE — ED Triage Notes (Signed)
Bilateral ear pain x 2 weeks after washing her hair PCP  gave her levofloxacin for ear infection which caused a rash  Only took one dose- rash started 05/25/22 OTC topical creams

## 2022-06-25 ENCOUNTER — Other Ambulatory Visit: Payer: Self-pay | Admitting: Internal Medicine

## 2022-06-25 NOTE — Telephone Encounter (Signed)
Please refill as per office routine med refill policy (all routine meds to be refilled for 3 mo or monthly (per pt preference) up to one year from last visit, then month to month grace period for 3 mo, then further med refills will have to be denied) ? ?

## 2022-08-07 ENCOUNTER — Other Ambulatory Visit: Payer: Self-pay | Admitting: Internal Medicine

## 2022-08-13 ENCOUNTER — Other Ambulatory Visit: Payer: Self-pay | Admitting: Internal Medicine

## 2022-08-31 ENCOUNTER — Other Ambulatory Visit: Payer: Self-pay

## 2022-08-31 MED ORDER — POTASSIUM CHLORIDE CRYS ER 10 MEQ PO TBCR: 10.0000 meq | EXTENDED_RELEASE_TABLET | Freq: Every day | ORAL | 2 refills | Status: DC

## 2022-09-23 ENCOUNTER — Other Ambulatory Visit: Payer: Self-pay | Admitting: Internal Medicine

## 2022-09-24 ENCOUNTER — Other Ambulatory Visit: Payer: Self-pay

## 2022-11-14 ENCOUNTER — Other Ambulatory Visit: Payer: Self-pay | Admitting: Internal Medicine

## 2022-11-25 ENCOUNTER — Other Ambulatory Visit: Payer: Self-pay | Admitting: Internal Medicine

## 2022-12-16 ENCOUNTER — Other Ambulatory Visit: Payer: Self-pay | Admitting: Internal Medicine

## 2022-12-17 ENCOUNTER — Other Ambulatory Visit: Payer: Self-pay

## 2023-01-14 ENCOUNTER — Other Ambulatory Visit: Payer: Self-pay | Admitting: Internal Medicine

## 2023-01-18 ENCOUNTER — Other Ambulatory Visit: Payer: Self-pay | Admitting: Internal Medicine

## 2023-01-21 ENCOUNTER — Telehealth: Payer: Self-pay | Admitting: Internal Medicine

## 2023-01-21 ENCOUNTER — Other Ambulatory Visit: Payer: Self-pay

## 2023-01-21 NOTE — Telephone Encounter (Signed)
Patient has an appointment tomorrow and would like to know if she can have labs done prior to that appointment. Best callback is [phone].

## 2023-01-22 ENCOUNTER — Other Ambulatory Visit: Payer: BC Managed Care – PPO

## 2023-01-22 ENCOUNTER — Ambulatory Visit: Payer: BC Managed Care – PPO | Admitting: Internal Medicine

## 2023-01-22 ENCOUNTER — Encounter: Payer: Self-pay | Admitting: Internal Medicine

## 2023-01-22 VITALS — BP 154/86 | HR 58 | Temp 98.1°F | Ht 67.5 in | Wt 175.0 lb

## 2023-01-22 DIAGNOSIS — I10 Essential (primary) hypertension: Secondary | ICD-10-CM | POA: Diagnosis not present

## 2023-01-22 DIAGNOSIS — I890 Lymphedema, not elsewhere classified: Secondary | ICD-10-CM | POA: Diagnosis not present

## 2023-01-22 DIAGNOSIS — E7800 Pure hypercholesterolemia, unspecified: Secondary | ICD-10-CM

## 2023-01-22 DIAGNOSIS — E559 Vitamin D deficiency, unspecified: Secondary | ICD-10-CM

## 2023-01-22 DIAGNOSIS — E039 Hypothyroidism, unspecified: Secondary | ICD-10-CM

## 2023-01-22 DIAGNOSIS — Z0000 Encounter for general adult medical examination without abnormal findings: Secondary | ICD-10-CM | POA: Diagnosis not present

## 2023-01-22 DIAGNOSIS — M810 Age-related osteoporosis without current pathological fracture: Secondary | ICD-10-CM

## 2023-01-22 DIAGNOSIS — Z23 Encounter for immunization: Secondary | ICD-10-CM

## 2023-01-22 DIAGNOSIS — E538 Deficiency of other specified B group vitamins: Secondary | ICD-10-CM

## 2023-01-22 DIAGNOSIS — E119 Type 2 diabetes mellitus without complications: Secondary | ICD-10-CM

## 2023-01-22 DIAGNOSIS — Z1211 Encounter for screening for malignant neoplasm of colon: Secondary | ICD-10-CM | POA: Diagnosis not present

## 2023-01-22 DIAGNOSIS — Z0001 Encounter for general adult medical examination with abnormal findings: Secondary | ICD-10-CM

## 2023-01-22 MED ORDER — ALENDRONATE SODIUM 70 MG PO TABS: ORAL_TABLET | ORAL | 3 refills | Status: DC

## 2023-01-22 MED ORDER — ATORVASTATIN CALCIUM 10 MG PO TABS: 10.0000 mg | ORAL_TABLET | Freq: Every day | ORAL | 3 refills | Status: DC

## 2023-01-22 MED ORDER — LEVOTHYROXINE SODIUM 100 MCG PO TABS: 100.0000 ug | ORAL_TABLET | Freq: Every day | ORAL | 3 refills | Status: DC

## 2023-01-22 MED ORDER — HYDRALAZINE HCL 100 MG PO TABS: 100.0000 mg | ORAL_TABLET | Freq: Two times a day (BID) | ORAL | 3 refills | Status: DC

## 2023-01-22 MED ORDER — IRBESARTAN 300 MG PO TABS: 300.0000 mg | ORAL_TABLET | Freq: Every day | ORAL | 3 refills | Status: DC

## 2023-01-22 MED ORDER — DILTIAZEM HCL ER COATED BEADS 360 MG PO CP24: 360.0000 mg | ORAL_CAPSULE | Freq: Every day | ORAL | 3 refills | Status: DC

## 2023-01-22 MED ORDER — FUROSEMIDE 40 MG PO TABS: 40.0000 mg | ORAL_TABLET | Freq: Every day | ORAL | 3 refills | Status: DC

## 2023-01-22 MED ORDER — POTASSIUM CHLORIDE CRYS ER 10 MEQ PO TBCR: 10.0000 meq | EXTENDED_RELEASE_TABLET | Freq: Every day | ORAL | 3 refills | Status: DC

## 2023-01-22 MED ORDER — AMITRIPTYLINE HCL 100 MG PO TABS: ORAL_TABLET | ORAL | 1 refills | Status: DC

## 2023-01-22 NOTE — Telephone Encounter (Signed)
Patient called to check on the status of her request. She would like a call back at [phone].

## 2023-01-22 NOTE — Progress Notes (Unsigned)
Patient ID: Penny Yates, female   DOB: 1955-10-05, 67 y.o.   MRN: [id number]         Chief Complaint:: wellness exam and htn, osteoporosis. Chronic lymphedema, dm, hld, low thyroid       HPI:  Penny Yates is a 67 y.o. female here for wellness exam; declines covid booster, but for colonoscopy, dxa, and o/w up to date                        Also Pt denies chest pain, increased sob or doe, wheezing, orthopnea, PND, increased LE swelling, palpitations, dizziness or syncope.   Pt denies polydipsia, polyuria, or new focal neuro s/s.    Pt denies fever, wt loss, night sweats, loss of appetite, or other constitutional symptoms  BP has been contolled at home, only seems to be higher here.  Has not been able to access the local lymphedema clinic due to booked by cancer related patients of higher priority, pt asks for other referral.     Wt Readings from Last 3 Encounters:  01/22/23 175 lb (79.4 kg)  06/08/22 178 lb (80.7 kg)  05/23/22 178 lb 8 oz (81 kg)   BP Readings from Last 3 Encounters:  01/22/23 (!) 154/86  06/08/22 (!) 145/84  05/23/22 108/74   Immunization History  Administered Date(s) Administered   Fluad Quad(high Dose 65+) 12/20/2018   Fluad Trivalent(High Dose 65+) 01/22/2023   Influenza Inj Mdck Quad Pf 01/28/2018   Influenza Split 01/08/2011, 02/12/2012   Influenza Whole 02/18/2009   Influenza, High Dose Seasonal PF 02/11/2020, 01/20/2022   Influenza,inj,Quad PF,6+ Mos 02/19/2013, 07/06/2015, 03/09/2016, 03/09/2016, 12/18/2016   Influenza-Unspecified 12/18/2018, 01/28/2021, 01/22/2022   PFIZER Comirnaty(Gray Top)Covid-19 Tri-Sucrose Vaccine 07/30/2020   PFIZER(Purple Top)SARS-COV-2 Vaccination 05/28/2019, 06/22/2019, 01/21/2020   Pfizer Covid-19 Vaccine Bivalent Booster 102yrs & up 02/07/2021   Pneumococcal Conjugate-13 07/02/2016   Pneumococcal Polysaccharide-23 10/15/2019   Td 12/22/2004   Tdap 06/26/2016   Unspecified SARS-COV-2 Vaccination 01/20/2022   Zoster  Recombinant(Shingrix) 06/29/2017, 11/10/2017   Health Maintenance Due  Topic Date Due   Diabetic kidney evaluation - Urine ACR  10/28/2022   HEMOGLOBIN A1C  10/29/2022   Colonoscopy  12/18/2022   COVID-19 Vaccine (7 - 2023-24 season) 12/23/2022      Past Medical History:  Diagnosis Date   Anemia    Chronic venous insufficiency    LE's   Depression    pt denies   Diabetes mellitus without complication (HCC)    no meds  pt. denies states she is pre diabetic    Fracture of shaft of right radius 01/06/2013   History of colonic polyps    multiple of succeeding years 96, 97, 98, and 99   Hyperlipidemia    Hypertension    Hyperthyroidism    s/p rad Iodine-ellison   Hypothyroidism 01/19/2015   Leg length discrepancy    right leg longer   Low back pain    Lymphedema    left arm, both legs; primary lymphedema   Lymphedema    legs   Neuromuscular disorder (HCC)    sciatiaca   Osteoporosis    Sacroiliitis (HCC)    history of   Past Surgical History:  Procedure Laterality Date   [place]   EYE SURGERY     both eyes-muscle repair  cataract right eye   OPEN REDUCTION INTERNAL FIXATION (ORIF) DISTAL RADIAL FRACTURE Right 01/06/2013   Procedure: RIGHT OPEN REDUCTION INTERNAL FIXATION (ORIF)  DISTAL ARTICULATING RADIAL FRACTURE ;  Surgeon: Eulas Post, MD;  Location: [facility];  Service: Orthopedics;  Laterality: Right;   REPLACEMENT TOTAL KNEE  june 2007   right   REVISION OF SCAR TISSUE RECTUS MUSCLE  11/08   right knee   TOTAL KNEE REVISION Right 04/13/2020   Procedure: Right knee polyethylene revision.;  Surgeon: Ollen Gross, MD;  Location: [facility];  Service: Orthopedics;  Laterality: Right;    TUBAL LIGATION      reports that she has never smoked. She has never used smokeless tobacco. She reports that she does not drink alcohol and does not use drugs. family history includes Cancer in her mother; Colon cancer (age of onset: 69) in  her mother; Diabetes in her mother and another family member; Heart disease in her maternal grandmother; Kidney disease in an other family member; Stroke in an other family member. Allergies  Allergen Reactions   Ace Inhibitors Cough   Levaquin [Levofloxacin] Rash   Lisinopril Other (See Comments)    Cough   Current Outpatient Medications on File Prior to Visit  Medication Sig Dispense Refill   ALPHAGAN P 0.1 % SOLN Place 1 drop into both eyes in the morning and at bedtime.   11   amoxicillin (AMOXIL) 500 MG capsule TAKE 2 CAPSULES BY MOUTH 1 HOUR PRIOR TO DENTAL PROCEDURE, THEN TAKE 2 CAPSULES 4 HOURS AFTER 4 capsule 2   ascorbic acid (VITAMIN C) 500 MG tablet Take 500 mg by mouth daily.     Biotin 5000 MCG CAPS Take 5,000 mcg by mouth daily.     cetirizine (ZYRTEC ALLERGY) 10 MG tablet Take 0.5 tablets (5 mg total) by mouth daily. 30 tablet 2   cholecalciferol (VITAMIN D3) 25 MCG (1000 UNIT) tablet Take 1,000 Units by mouth daily.     diclofenac Sodium (VOLTAREN) 1 % GEL Apply 2 g topically 4 (four) times daily as needed (pain).      dorzolamide-timolol (COSOPT) 22.3-6.8 MG/ML ophthalmic solution Place 1 drop into both eyes 2 (two) times daily.      fish oil-omega-3 fatty acids 1000 MG capsule Take 1 g by mouth daily.     guanFACINE (TENEX) 2 MG tablet TAKE 1 TABLET BY MOUTH EVERYDAY AT BEDTIME 90 tablet 2   latanoprost (XALATAN) 0.005 % ophthalmic solution Place 1 drop into both eyes at bedtime.  4   Multiple Vitamin (MULTIVITAMIN) capsule Take 1 capsule by mouth daily.     Probiotic Product (PHILLIPS COLON HEALTH PO) Take 1 capsule by mouth daily.      traMADol (ULTRAM-ER) 100 MG 24 hr tablet Take 1 tablet (100 mg total) by mouth daily. 30 tablet 2   No current facility-administered medications on file prior to visit.        ROS:  All others reviewed and negative.  Objective        PE:  BP (!) 154/86 (BP Location: Left Arm, Patient Position: Sitting, Cuff Size: Normal)   Pulse (!)  58   Temp 98.1 F (36.7 C) (Oral)   Ht 5' 7.5" (1.715 m)   Wt 175 lb (79.4 kg)   SpO2 99%   BMI 27.00 kg/m                 Constitutional: Pt appears in NAD               HENT: Head: NCAT.  Right Ear: External ear normal.                 Left Ear: External ear normal.                Eyes: . Pupils are equal, round, and reactive to light. Conjunctivae and EOM are normal               Nose: without d/c or deformity               Neck: Neck supple. Gross normal ROM               Cardiovascular: Normal rate and regular rhythm.                 Pulmonary/Chest: Effort normal and breath sounds without rales or wheezing.                Abd:  Soft, NT, ND, + BS, no organomegaly               Neurological: Pt is alert. At baseline orientation, motor grossly intact               Skin: Skin is warm. No rashes, no other new lesions, LE edema - 2-3+ lymphedema right > left legs to hips               Psychiatric: Pt behavior is normal without agitation   Micro: none  Cardiac tracings I have personally interpreted today:  none  Pertinent Radiological findings (summarize): none   Lab Results  Component Value Date   WBC 4.0 04/30/2022   HGB 10.5 (L) 04/30/2022   HCT 30.0 (L) 04/30/2022   PLT 320.0 04/30/2022   GLUCOSE 106 (H) 04/30/2022   CHOL 166 04/30/2022   TRIG 61.0 04/30/2022   HDL 81.80 04/30/2022   LDLDIRECT 131.2 10/27/2008   LDLCALC 72 04/30/2022   ALT 12 04/30/2022   AST 17 04/30/2022   NA 138 04/30/2022   K 4.5 04/30/2022   CL 102 04/30/2022   CREATININE 0.80 04/30/2022   BUN 16 04/30/2022   CO2 29 04/30/2022   TSH 0.76 04/30/2022   INR 1.0 04/06/2020   HGBA1C 6.8 (H) 04/30/2022   MICROALBUR 2.1 (H) 10/27/2021   Assessment/Plan:  Penny Yates is a 67 y.o. Black or African American [2] female with  has a past medical history of Anemia, Chronic venous insufficiency, Depression, Diabetes mellitus without complication (HCC), Fracture of shaft of right  radius (01/06/2013), History of colonic polyps, Hyperlipidemia, Hypertension, Hyperthyroidism, Hypothyroidism (01/19/2015), Leg length discrepancy, Low back pain, Lymphedema, Lymphedema, Neuromuscular disorder (HCC), Osteoporosis, and Sacroiliitis (HCC).  Encounter for well adult exam with abnormal findings Age and sex appropriate education and counseling updated with regular exercise and diet Referrals for preventative services - for colonoscopy, dxa Immunizations addressed - declines covid booster Smoking counseling  - none needed Evidence for depression or other mood disorder - none significant Most recent labs reviewed. I have personally reviewed and have noted: 1) the patient's medical and social history 2) The patient's current medications and supplements 3) The patient's height, weight, and BMI have been recorded in the chart   Diabetes mellitus with coincident hypertension (HCC) Lab Results  Component Value Date   HGBA1C 6.8 (H) 04/30/2022   Stable, pt to continue current medical treatment - diet, wt control   Essential hypertension BP Readings from Last 3 Encounters:  01/22/23 (!) 154/86  06/08/22 (!) 145/84  05/23/22 108/74  Uncontrolled here, but pt states controlled at home, pt to continue medical treatment card cd 360 every day, hydralazine 100 bid, avapro 300 qd   Hyperlipidemia Lab Results  Component Value Date   LDLCALC 72 04/30/2022   Uncontrolled, goal ldl < 70,, pt to continue dm diet, decliens statin   Hypothyroidism Lab Results  Component Value Date   TSH 0.76 04/30/2022   Stable, pt to continue levothyroxine 100 mcg   Lymphedema Chronic persistent, possibly worsening, for referral lymphedema clinic [facility]  Osteoporosis Also due for DXA  Followup: Return in about 1 year (around 01/22/2024).  Oliver Barre, MD 01/23/2023 8:39 PM [facility]  Primary Care - [facility]

## 2023-01-22 NOTE — Patient Instructions (Addendum)
Please continue all other medications as before, and refills have been done if requested.  Please have the pharmacy call with any other refills you may need.  Please continue your efforts at being more active, low cholesterol diet, and weight control.  You are otherwise up to date with prevention measures today.  Please keep your appointments with your specialists as you may have planned  Please schedule the bone density test before leaving today at the scheduling desk (where you check out)  You will be contacted regarding the referral for: colonoscopy, and lymphedema clinic  Please go to the LAB at the blood drawing area for the tests to be done  You will be contacted by phone if any changes need to be made immediately.  Otherwise, you will receive a letter about your results with an explanation, but please check with MyChart first.  Please make an Appointment to return for your 1 year visit, or sooner if needed, with Lab testing by Appointment as well, to be done about 3-5 days before at the FIRST FLOOR Lab (so this is for TWO appointments - please see the scheduling desk as you leave)

## 2023-01-23 ENCOUNTER — Encounter: Payer: Self-pay | Admitting: Internal Medicine

## 2023-01-23 NOTE — Assessment & Plan Note (Signed)
BP Readings from Last 3 Encounters:  01/22/23 (!) 154/86  06/08/22 (!) 145/84  05/23/22 108/74   Uncontrolled here, but pt states controlled at home, pt to continue medical treatment card cd 360 every day, hydralazine 100 bid, avapro 300 qd

## 2023-01-23 NOTE — Assessment & Plan Note (Signed)
Chronic persistent, possibly worsening, for referral lymphedema clinic [facility]

## 2023-01-23 NOTE — Assessment & Plan Note (Signed)
Lab Results  Component Value Date   TSH 0.76 04/30/2022   Stable, pt to continue levothyroxine 100 mcg

## 2023-01-23 NOTE — Assessment & Plan Note (Signed)
Lab Results  Component Value Date   LDLCALC 72 04/30/2022   Uncontrolled, goal ldl < 70,, pt to continue dm diet, decliens statin

## 2023-01-23 NOTE — Assessment & Plan Note (Signed)
Lab Results  Component Value Date   HGBA1C 6.8 (H) 04/30/2022   Stable, pt to continue current medical treatment - diet, wt control

## 2023-01-23 NOTE — Assessment & Plan Note (Signed)
Age and sex appropriate education and counseling updated with regular exercise and diet Referrals for preventative services - for colonoscopy, dxa Immunizations addressed - declines covid booster Smoking counseling  - none needed Evidence for depression or other mood disorder - none significant Most recent labs reviewed. I have personally reviewed and have noted: 1) the patient's medical and social history 2) The patient's current medications and supplements 3) The patient's height, weight, and BMI have been recorded in the chart

## 2023-01-23 NOTE — Assessment & Plan Note (Signed)
Also due for DXA

## 2023-01-24 ENCOUNTER — Encounter: Payer: Self-pay | Admitting: Internal Medicine

## 2023-01-25 ENCOUNTER — Telehealth: Payer: Self-pay

## 2023-01-25 NOTE — Telephone Encounter (Signed)
Called and let Pt husband know the Handicap form is ready for pick up, has been placed up front.

## 2023-01-29 ENCOUNTER — Other Ambulatory Visit (INDEPENDENT_AMBULATORY_CARE_PROVIDER_SITE_OTHER): Payer: BC Managed Care – PPO

## 2023-01-29 DIAGNOSIS — E538 Deficiency of other specified B group vitamins: Secondary | ICD-10-CM | POA: Diagnosis not present

## 2023-01-29 DIAGNOSIS — E119 Type 2 diabetes mellitus without complications: Secondary | ICD-10-CM | POA: Diagnosis not present

## 2023-01-29 DIAGNOSIS — E559 Vitamin D deficiency, unspecified: Secondary | ICD-10-CM | POA: Diagnosis not present

## 2023-01-29 DIAGNOSIS — I10 Essential (primary) hypertension: Secondary | ICD-10-CM | POA: Diagnosis not present

## 2023-01-29 LAB — VITAMIN B12: Vitamin B-12: 549 pg/mL (ref 211–911)

## 2023-01-29 LAB — CBC WITH DIFFERENTIAL/PLATELET
Basophils Absolute: 0 10*3/uL (ref 0.0–0.1)
Basophils Relative: 0.5 % (ref 0.0–3.0)
Eosinophils Absolute: 0.2 10*3/uL (ref 0.0–0.7)
Eosinophils Relative: 5.3 % — ABNORMAL HIGH (ref 0.0–5.0)
HCT: 34.5 % — ABNORMAL LOW (ref 36.0–46.0)
Hemoglobin: 11.8 g/dL — ABNORMAL LOW (ref 12.0–15.0)
Lymphocytes Relative: 35.1 % (ref 12.0–46.0)
Lymphs Abs: 1 10*3/uL (ref 0.7–4.0)
MCHC: 34.3 g/dL (ref 30.0–36.0)
MCV: 104.3 fL — ABNORMAL HIGH (ref 78.0–100.0)
Monocytes Absolute: 0.2 10*3/uL (ref 0.1–1.0)
Monocytes Relative: 8.3 % (ref 3.0–12.0)
Neutro Abs: 1.5 10*3/uL (ref 1.4–7.7)
Neutrophils Relative %: 50.8 % (ref 43.0–77.0)
Platelets: 264 10*3/uL (ref 150.0–400.0)
RBC: 3.31 Mil/uL — ABNORMAL LOW (ref 3.87–5.11)
RDW: 17.8 % — ABNORMAL HIGH (ref 11.5–15.5)
WBC: 2.9 10*3/uL — ABNORMAL LOW (ref 4.0–10.5)

## 2023-01-29 LAB — BASIC METABOLIC PANEL
BUN: 15 mg/dL (ref 6–23)
CO2: 29 meq/L (ref 19–32)
Calcium: 9.6 mg/dL (ref 8.4–10.5)
Chloride: 104 meq/L (ref 96–112)
Creatinine, Ser: 0.89 mg/dL (ref 0.40–1.20)
GFR: 66.22 mL/min (ref >60.00)
Glucose, Bld: 112 mg/dL — ABNORMAL HIGH (ref 70–99)
Potassium: 4.1 meq/L (ref 3.5–5.1)
Sodium: 141 meq/L (ref 135–145)

## 2023-01-29 LAB — URINALYSIS, ROUTINE W REFLEX MICROSCOPIC
Bilirubin Urine: NEGATIVE
Hgb urine dipstick: NEGATIVE
Ketones, ur: NEGATIVE
Leukocytes,Ua: NEGATIVE
Nitrite: NEGATIVE
RBC / HPF: NONE SEEN (ref 0–?)
Specific Gravity, Urine: 1.02 (ref 1.000–1.030)
Total Protein, Urine: NEGATIVE
Urine Glucose: NEGATIVE
Urobilinogen, UA: 0.2 (ref 0.0–1.0)
pH: 7 (ref 5.0–8.0)

## 2023-01-29 LAB — HEPATIC FUNCTION PANEL
ALT: 12 U/L (ref 0–35)
AST: 15 U/L (ref 0–37)
Albumin: 3.7 g/dL (ref 3.5–5.2)
Alkaline Phosphatase: 67 U/L (ref 39–117)
Bilirubin, Direct: 0.1 mg/dL (ref 0.0–0.3)
Total Bilirubin: 0.4 mg/dL (ref 0.2–1.2)
Total Protein: 6.8 g/dL (ref 6.0–8.3)

## 2023-01-29 LAB — LIPID PANEL
Cholesterol: 153 mg/dL (ref 0–200)
HDL: 85.1 mg/dL (ref >39.00)
LDL Cholesterol: 57 mg/dL (ref 0–99)
NonHDL: 68.31
Total CHOL/HDL Ratio: 2
Triglycerides: 58 mg/dL (ref 0.0–149.0)
VLDL: 11.6 mg/dL (ref 0.0–40.0)

## 2023-01-29 LAB — MICROALBUMIN / CREATININE URINE RATIO
Creatinine,U: 208.2 mg/dL
Microalb Creat Ratio: 0.7 mg/g (ref 0.0–30.0)
Microalb, Ur: 1.4 mg/dL (ref 0.0–1.9)

## 2023-01-29 LAB — TSH: TSH: 1.12 u[IU]/mL (ref 0.35–5.50)

## 2023-01-29 LAB — VITAMIN D 25 HYDROXY (VIT D DEFICIENCY, FRACTURES): VITD: 38.03 ng/mL (ref 30.00–100.00)

## 2023-01-29 LAB — HEMOGLOBIN A1C: Hgb A1c MFr Bld: 6.2 % (ref 4.6–6.5)

## 2023-01-29 NOTE — Progress Notes (Signed)
The test results show that your current treatment is OK, as the tests are stable.  Please continue the same plan.  There is no other need for change of treatment or further evaluation based on these results, at this time.  thanks 

## 2023-02-04 ENCOUNTER — Encounter: Payer: Self-pay | Admitting: Gastroenterology

## 2023-02-11 ENCOUNTER — Ambulatory Visit (INDEPENDENT_AMBULATORY_CARE_PROVIDER_SITE_OTHER)
Admission: RE | Admit: 2023-02-11 | Discharge: 2023-02-11 | Disposition: A | Discharge status: Home / Self Care | Payer: BC Managed Care – PPO | Source: Ambulatory Visit | Attending: Internal Medicine | Admitting: Internal Medicine

## 2023-02-11 DIAGNOSIS — M810 Age-related osteoporosis without current pathological fracture: Secondary | ICD-10-CM

## 2023-02-15 ENCOUNTER — Encounter: Payer: Self-pay | Admitting: Gastroenterology

## 2023-02-15 ENCOUNTER — Ambulatory Visit (AMBULATORY_SURGERY_CENTER): Payer: BC Managed Care – PPO | Admitting: *Deleted

## 2023-02-15 ENCOUNTER — Telehealth: Payer: Self-pay | Admitting: *Deleted

## 2023-02-15 VITALS — Ht 67.5 in | Wt 170.0 lb

## 2023-02-15 DIAGNOSIS — Z800 Family history of malignant neoplasm of digestive organs: Secondary | ICD-10-CM

## 2023-02-15 DIAGNOSIS — Z860100 Personal history of colon polyps, unspecified: Secondary | ICD-10-CM

## 2023-02-15 MED ORDER — NA SULFATE-K SULFATE-MG SULF 17.5-3.13-1.6 GM/177ML PO SOLN
1.0000 | Freq: Once | ORAL | 0 refills | Status: AC
Start: 2023-02-15 — End: 2023-02-15

## 2023-02-15 NOTE — Telephone Encounter (Signed)
Attempt to reach pt for pre-visit. LM with call back #.   Will attempt other number in profile Second attempt reached pt

## 2023-02-15 NOTE — Progress Notes (Signed)
Pt's name and DOB verified at the beginning of the pre-visit wit 2 identifiers  Pt denies any difficulty with ambulating,sitting, laying down or rolling side to side  Gave both [facility] main # and MD on call # prior to instructions.   No egg or soy allergy known to patient   No issues known to pt with past sedation with any surgeries or procedures  Patient denies ever being intubated  Pt has no issues moving head neck or swallowing  No FH of Malignant Hyperthermia  Pt is not on diet pills or shots  Pt is not on home 02   Pt is not on blood thinners   Pt denies issues with constipation    Pt is not on dialysis  Pt denise any abnormal heart rhythms   Pt denies any upcoming cardiac testing  Pt encouraged to use to use Singlecare or Goodrx to reduce cost   Patient's chart reviewed by Cathlyn Parsons CNRA prior to pre-visit and patient appropriate for the [facility].  Pre-visit completed and red dot placed by patient's name on their procedure day (on provider's schedule).  .  Visit by phone  Pt states weight is 170 lb  Instructed pt why it is important to and  to call if they have any changes in health or new medications. Directed them to the # given and on instructions.     Instructions reviewed with pt and pt states understanding. Instructed to review again prior to procedure. Pt states they will.   Instructions sent by mail with coupon and by my chart Coupon sent via text to pt phone for med

## 2023-02-19 LAB — LAB REPORT - SCANNED
Albumin, Urine POC: 11.5
EGFR: 59
Microalb Creat Ratio: 7

## 2023-02-25 ENCOUNTER — Encounter: Payer: Self-pay | Admitting: Gastroenterology

## 2023-02-25 ENCOUNTER — Ambulatory Visit (AMBULATORY_SURGERY_CENTER): Payer: BC Managed Care – PPO | Admitting: Gastroenterology

## 2023-02-25 VITALS — BP 144/76 | HR 59 | Temp 97.5°F | Resp 13 | Ht 67.0 in | Wt 170.0 lb

## 2023-02-25 DIAGNOSIS — Z1211 Encounter for screening for malignant neoplasm of colon: Secondary | ICD-10-CM | POA: Diagnosis present

## 2023-02-25 DIAGNOSIS — Z800 Family history of malignant neoplasm of digestive organs: Secondary | ICD-10-CM

## 2023-02-25 DIAGNOSIS — Z860100 Personal history of colon polyps, unspecified: Secondary | ICD-10-CM

## 2023-02-25 MED ORDER — NA SULFATE-K SULFATE-MG SULF 17.5-3.13-1.6 GM/177ML PO SOLN: 1.0000 | Freq: Once | ORAL | 0 refills | Status: AC

## 2023-02-25 MED ORDER — ONDANSETRON HCL 4 MG PO TABS: 4.0000 mg | ORAL_TABLET | ORAL | 0 refills | Status: DC

## 2023-02-25 MED ORDER — SODIUM CHLORIDE 0.9 % IV SOLN: 500.0000 mL | Freq: Once | INTRAVENOUS | Status: DC

## 2023-02-25 NOTE — Progress Notes (Signed)
A/O x 3, gd SR's, VSS, report to RN

## 2023-02-25 NOTE — Progress Notes (Signed)
[facility] History and Physical   Primary Care Physician:  Corwin Levins, MD   Reason for Procedure:   Family history of colonoscopy  Plan:    colonoscopy     HPI: Penny Yates is a 67 y.o. female  here for colonoscopy screening - last exam 11/2017 was normal. Mother had colon cancer dx age 60s   . Patient denies any bowel symptoms at this time. Otherwise feels well without any cardiopulmonary symptoms.     I have discussed risks / benefits of anesthesia and endoscopic procedure with Threasa Beards and they wish to proceed with the exams as outlined today.    Past Medical History:  Diagnosis Date   Anemia    Chronic venous insufficiency    LE's   Depression    pt denies   Diabetes mellitus without complication (HCC)    no meds  pt. denies states she is pre diabetic    Fracture of shaft of right radius 01/06/2013   History of colonic polyps    multiple of succeeding years 96, 97, 98, and 99   Hyperlipidemia    Hypertension    Hyperthyroidism    s/p rad Iodine-ellison   Hypothyroidism 01/19/2015   Leg length discrepancy    right leg longer   Low back pain    Lymphedema    left arm, both legs; primary lymphedema   Lymphedema    legs   Neuromuscular disorder (HCC)    sciatiaca   Osteoporosis    Sacroiliitis (HCC)    history of    Past Surgical History:  Procedure Laterality Date   [place]   EYE SURGERY     both eyes-muscle repair  cataract right eye   OPEN REDUCTION INTERNAL FIXATION (ORIF) DISTAL RADIAL FRACTURE Right 01/06/2013   Procedure: RIGHT OPEN REDUCTION INTERNAL FIXATION (ORIF) DISTAL ARTICULATING RADIAL FRACTURE ;  Surgeon: Eulas Post, MD;  Location: [facility];  Service: Orthopedics;  Laterality: Right;   REPLACEMENT TOTAL KNEE  june 2007   right   REVISION OF SCAR TISSUE RECTUS MUSCLE  11/08   right knee   TOTAL KNEE REVISION Right 04/13/2020   Procedure: Right knee polyethylene revision.;   Surgeon: Ollen Gross, MD;  Location: [facility];  Service: Orthopedics;  Laterality: Right;    TUBAL LIGATION      Prior to Admission medications   Medication Sig Start Date End Date Taking? Authorizing Provider  ALPHAGAN P 0.1 % SOLN Place 1 drop into both eyes in the morning and at bedtime.  05/31/16  Yes [provider]  amitriptyline (ELAVIL) 100 MG tablet TAKE 1/2 TO 1 TABLET BY MOUTH AT BEDTIME AS NEEDED 01/22/23  Yes Corwin Levins, MD  atorvastatin (LIPITOR) 10 MG tablet Take 1 tablet (10 mg total) by mouth daily. 01/22/23  Yes Corwin Levins, MD  diltiazem (CARDIZEM CD) 360 MG 24 hr capsule Take 1 capsule (360 mg total) by mouth daily. 01/22/23  Yes Corwin Levins, MD  dorzolamide-timolol (COSOPT) 22.3-6.8 MG/ML ophthalmic solution Place 1 drop into both eyes 2 (two) times daily.    Yes [provider]  furosemide (LASIX) 40 MG tablet Take 1 tablet (40 mg total) by mouth daily. Patient taking differently: Take 40 mg by mouth daily. As needed 01/22/23  Yes Corwin Levins, MD  guanFACINE (TENEX) 2 MG tablet TAKE 1 TABLET BY MOUTH EVERYDAY AT BEDTIME 09/24/22  Yes Corwin Levins, MD  hydrALAZINE (  APRESOLINE) 100 MG tablet Take 1 tablet (100 mg total) by mouth 2 (two) times daily. 01/22/23  Yes Corwin Levins, MD  irbesartan (AVAPRO) 300 MG tablet Take 1 tablet (300 mg total) by mouth daily. 01/22/23  Yes Corwin Levins, MD  latanoprost (XALATAN) 0.005 % ophthalmic solution Place 1 drop into both eyes at bedtime. 09/12/17  Yes [provider]  levothyroxine (SYNTHROID) 100 MCG tablet Take 1 tablet (100 mcg total) by mouth daily. 01/22/23  Yes Corwin Levins, MD  potassium chloride (KLOR-CON M10) 10 MEQ tablet Take 1 tablet (10 mEq total) by mouth daily. 01/22/23  Yes Corwin Levins, MD  traMADol (ULTRAM-ER) 100 MG 24 hr tablet Take 1 tablet (100 mg total) by mouth daily. 11/26/22  Yes Corwin Levins, MD  alendronate (FOSAMAX) 70 MG tablet TAKE 1 TABLET BY MOUTH EVERY 7 DAYS. TAKE  WITH A FULL GLASS OF WATER ON AN EMPTY STOMACH. 01/22/23   Corwin Levins, MD  amoxicillin (AMOXIL) 500 MG capsule TAKE 2 CAPSULES BY MOUTH 1 HOUR PRIOR TO DENTAL PROCEDURE, THEN TAKE 2 CAPSULES 4 HOURS AFTER 08/07/22   Corwin Levins, MD  ascorbic acid (VITAMIN C) 500 MG tablet Take 500 mg by mouth daily.    [provider]  Biotin 5000 MCG CAPS Take 5,000 mcg by mouth daily.    [provider]  cetirizine (ZYRTEC ALLERGY) 10 MG tablet Take 0.5 tablets (5 mg total) by mouth daily. 06/08/22   Rising, Lurena Joiner, PA-C  cholecalciferol (VITAMIN D3) 25 MCG (1000 UNIT) tablet Take 1,000 Units by mouth daily.    [provider]  diclofenac Sodium (VOLTAREN) 1 % GEL Apply 2 g topically 4 (four) times daily as needed (pain).     [provider]  fish oil-omega-3 fatty acids 1000 MG capsule Take 1 g by mouth daily.    [provider]  Multiple Vitamin (MULTIVITAMIN) capsule Take 1 capsule by mouth daily.    [provider]  Probiotic Product (PHILLIPS COLON HEALTH PO) Take 1 capsule by mouth daily.     [provider]    Current Outpatient Medications  Medication Sig Dispense Refill   ALPHAGAN P 0.1 % SOLN Place 1 drop into both eyes in the morning and at bedtime.   11   amitriptyline (ELAVIL) 100 MG tablet TAKE 1/2 TO 1 TABLET BY MOUTH AT BEDTIME AS NEEDED 90 tablet 1   atorvastatin (LIPITOR) 10 MG tablet Take 1 tablet (10 mg total) by mouth daily. 90 tablet 3   diltiazem (CARDIZEM CD) 360 MG 24 hr capsule Take 1 capsule (360 mg total) by mouth daily. 90 capsule 3   dorzolamide-timolol (COSOPT) 22.3-6.8 MG/ML ophthalmic solution Place 1 drop into both eyes 2 (two) times daily.      furosemide (LASIX) 40 MG tablet Take 1 tablet (40 mg total) by mouth daily. (Patient taking differently: Take 40 mg by mouth daily. As needed) 90 tablet 3   guanFACINE (TENEX) 2 MG tablet TAKE 1 TABLET BY MOUTH EVERYDAY AT BEDTIME 90 tablet 2   hydrALAZINE (APRESOLINE)  100 MG tablet Take 1 tablet (100 mg total) by mouth 2 (two) times daily. 180 tablet 3   irbesartan (AVAPRO) 300 MG tablet Take 1 tablet (300 mg total) by mouth daily. 90 tablet 3   latanoprost (XALATAN) 0.005 % ophthalmic solution Place 1 drop into both eyes at bedtime.  4   levothyroxine (SYNTHROID) 100 MCG tablet Take 1 tablet (100 mcg total) by  mouth daily. 90 tablet 3   potassium chloride (KLOR-CON M10) 10 MEQ tablet Take 1 tablet (10 mEq total) by mouth daily. 90 tablet 3   traMADol (ULTRAM-ER) 100 MG 24 hr tablet Take 1 tablet (100 mg total) by mouth daily. 30 tablet 2   alendronate (FOSAMAX) 70 MG tablet TAKE 1 TABLET BY MOUTH EVERY 7 DAYS. TAKE WITH A FULL GLASS OF WATER ON AN EMPTY STOMACH. 12 tablet 3   amoxicillin (AMOXIL) 500 MG capsule TAKE 2 CAPSULES BY MOUTH 1 HOUR PRIOR TO DENTAL PROCEDURE, THEN TAKE 2 CAPSULES 4 HOURS AFTER 4 capsule 2   ascorbic acid (VITAMIN C) 500 MG tablet Take 500 mg by mouth daily.     Biotin 5000 MCG CAPS Take 5,000 mcg by mouth daily.     cetirizine (ZYRTEC ALLERGY) 10 MG tablet Take 0.5 tablets (5 mg total) by mouth daily. 30 tablet 2   cholecalciferol (VITAMIN D3) 25 MCG (1000 UNIT) tablet Take 1,000 Units by mouth daily.     diclofenac Sodium (VOLTAREN) 1 % GEL Apply 2 g topically 4 (four) times daily as needed (pain).      fish oil-omega-3 fatty acids 1000 MG capsule Take 1 g by mouth daily.     Multiple Vitamin (MULTIVITAMIN) capsule Take 1 capsule by mouth daily.     Probiotic Product (PHILLIPS COLON HEALTH PO) Take 1 capsule by mouth daily.      Current Facility-Administered Medications  Medication Dose Route Frequency Provider Last Rate Last Admin   0.9 %  sodium chloride infusion  500 mL Intravenous Once Jonel Weldon, Willaim Rayas, MD        Allergies as of 02/25/2023 - Review Complete 02/25/2023  Allergen Reaction Noted   Ace inhibitors Cough 02/12/2012   Levaquin [levofloxacin] Rash 05/25/2022   Lisinopril Other (See Comments) 10/21/2012     Family History  Problem Relation Age of Onset   Colon cancer Mother 94   Diabetes Mother    Cancer Mother        Colon Cancer   Heart disease Maternal Grandmother    Diabetes Other        mother and several others in family   Stroke Other    Kidney disease Other    Thyroid disease Neg Hx    Esophageal cancer Neg Hx    Rectal cancer Neg Hx    Stomach cancer Neg Hx    Colon polyps Neg Hx     Social History   Socioeconomic History   Marital status: Married    Spouse name: Not on file   Number of children: 2   Years of education: Not on file   Highest education level: Not on file  Occupational History   Not on file  Tobacco Use   Smoking status: Never   Smokeless tobacco: Never  Vaping Use   Vaping status: Never Used  Substance and Sexual Activity   Alcohol use: No   Drug use: No   Sexual activity: Not Currently  Other Topics Concern   Not on file  Social History Narrative   Moved to [place] in Jan 2009. Married with children. 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 Looking for a new job.      Patient has two sons. One is deceased.   Right Handed    Lives in a two story home    Social Determinants of Health   Financial Resource Strain: Not  on file  Food Insecurity: Not on file  Transportation Needs: Not on file  Physical Activity: Not on file  Stress: Not on file  Social Connections: Not on file  Intimate Partner Violence: Not on file    Review of Systems: All other review of systems negative except as mentioned in the HPI.  Physical Exam: Vital signs BP 133/83   Pulse 74   Temp (!) 97.5 F (36.4 C)   Ht 5\' 7"  (1.702 m)   Wt 170 lb (77.1 kg)   SpO2 99%   BMI 26.63 kg/m   General:   Alert,  Well-developed, pleasant and cooperative in NAD Lungs:  Clear throughout to auscultation.   Heart:  Regular rate and rhythm Abdomen:  Soft, nontender and nondistended.   Neuro/Psych:  Alert  and cooperative. Normal mood and affect. A and O x 3  Harlin Rain, MD [facility]

## 2023-02-25 NOTE — Patient Instructions (Addendum)
Resume previous diet Continue present medications Colonoscopy r/s on 03/12/23--see instructions  Begin taking miralax twice daily one capful in 8 ounces of juice or water  YOU HAD AN ENDOSCOPIC PROCEDURE TODAY AT THE [facility]:   Refer to the procedure report that was given to you for any specific questions about what was found during the examination.  If the procedure report does not answer your questions, please call your gastroenterologist to clarify.  If you requested that your care partner not be given the details of your procedure findings, then the procedure report has been included in a sealed envelope for you to review at your convenience later.  YOU SHOULD EXPECT: Some feelings of bloating in the abdomen. Passage of more gas than usual.  Walking can help get rid of the air that was put into your GI tract during the procedure and reduce the bloating. If you had a lower endoscopy (such as a colonoscopy or flexible sigmoidoscopy) you may notice spotting of blood in your stool or on the toilet paper. If you underwent a bowel prep for your procedure, you may not have a normal bowel movement for a few days.  Please Note:  You might notice some irritation and congestion in your nose or some drainage.  This is from the oxygen used during your procedure.  There is no need for concern and it should clear up in a day or so.  SYMPTOMS TO REPORT IMMEDIATELY:  Following lower endoscopy (colonoscopy):  Excessive amounts of blood in the stool  Significant tenderness or worsening of abdominal pains  Swelling of the abdomen that is new, acute  Fever of 100F or higher  For urgent or emergent issues, a gastroenterologist can be reached at any hour by calling [phone]. Do not use MyChart messaging for urgent concerns.    DIET:  We do recommend a small meal at first, but then you may proceed to your regular diet.  Drink plenty of fluids but you should avoid alcoholic beverages for  24 hours.  ACTIVITY:  You should plan to take it easy for the rest of today and you should NOT DRIVE or use heavy machinery until tomorrow (because of the sedation medicines used during the test).    FOLLOW UP: Our staff will call the number listed on your records the next business day following your procedure.  We will call around 7:15- 8:00 am to check on you and address any questions or concerns that you may have regarding the information given to you following your procedure. If we do not reach you, we will leave a message.     If any biopsies were taken you will be contacted by phone or by letter within the next 1-3 weeks.  Please call us at [phone] if you have not heard about the biopsies in 3 weeks.    SIGNATURES/CONFIDENTIALITY: You and/or your care partner have signed paperwork which will be entered into your electronic medical record.  These signatures attest to the fact that that the information above on your After Visit Summary has been reviewed and is understood.  Full responsibility of the confidentiality of this discharge information lies with you and/or your care-partner.

## 2023-02-25 NOTE — Op Note (Signed)
[facility] Patient Name: Penny Yates Procedure Date: 02/25/2023 8:00 AM MRN: [id number] Endoscopist: Viviann Spare P. Adela Lank , MD, [id number] Age: 67 Referring MD:  Date of Birth: Feb 08, 1956 Gender: Female Account #: [id number] Procedure:                Colonoscopy Indications:              Screening in patient at increased risk: Family                            history of 1st-degree relative with colorectal                            cancer (mother dx age 6s). Last exam 11/2017 Medicines:                Monitored Anesthesia Care Procedure:                Pre-Anesthesia Assessment:                           - Prior to the procedure, a History and Physical                            was performed, and patient medications and                            allergies were reviewed. The patient's tolerance of                            previous anesthesia was also reviewed. The risks                            and benefits of the procedure and the sedation                            options and risks were discussed with the patient.                            All questions were answered, and informed consent                            was obtained. Prior Anticoagulants: The patient has                            taken no anticoagulant or antiplatelet agents. ASA                            Grade Assessment: III - A patient with severe                            systemic disease. After reviewing the risks and                            benefits, the patient was deemed in satisfactory  condition to undergo the procedure.                           After obtaining informed consent, the colonoscope                            was passed under direct vision. Throughout the                            procedure, the patient's blood pressure, pulse, and                            oxygen saturations were monitored continuously. The                            Olympus  Scope SN: [id number] was introduced through                            the anus and advanced to the the cecum, identified                            by appendiceal orifice and ileocecal valve. The                            colonoscopy was performed without difficulty. The                            patient tolerated the procedure well. The quality                            of the bowel preparation was inadequate. The                            ileocecal valve, appendiceal orifice, and rectum                            were photographed. Scope In: 8:03:23 AM Scope Out: 8:12:15 AM Scope Withdrawal Time: 0 hours 3 minutes 55 seconds  Total Procedure Duration: 0 hours 8 minutes 52 seconds  Findings:                 The perianal and digital rectal examinations were                            normal.                           A large amount of semi-liquid stool was found in                            the entire colon, making visualization difficult.                            Lavage of the colon was performed using copious  amounts of sterile water, resulting in incomplete                            clearance with continued poor visualization.                            Initially I thought we could possibly clear the                            transverse and left colon was stool was mostly                            liquid, but entering the right colon the prep was                            worse and could not clear it, more solid fragments.                            The procedure was aborted at that point. Prep                            inadequate for screening purposes.                           The exam was otherwise without abnormality of what                            was able to be visualized. Complications:            No immediate complications. Estimated blood loss:                            None. Estimated Blood Loss:     Estimated blood loss:  none. Impression:               - Preparation of the colon was inadequate,                            significant residual stool in the entire examined                            colon.                           - The examination was otherwise normal of what was                            visualized. Recommendation:           - Patient has a contact number available for                            emergencies. The signs and symptoms of potential                            delayed complications  were discussed with the                            patient. Return to normal activities tomorrow.                            Written discharge instructions were provided to the                            patient.                           - Resume previous diet.                           - Continue present medications.                           - Repeat colonoscopy because the bowel preparation                            was suboptimal. Patient will need 2 day prep. Will                            discuss options with her for rescheduling. Viviann Spare P. Corrie Reder, MD 02/25/2023 8:18:49 AM This report has been signed electronically.

## 2023-02-25 NOTE — Progress Notes (Signed)
Pt's states no medical or surgical changes since previsit or office visit. 

## 2023-02-26 ENCOUNTER — Telehealth: Payer: Self-pay | Admitting: *Deleted

## 2023-02-26 ENCOUNTER — Other Ambulatory Visit: Payer: Self-pay | Admitting: Internal Medicine

## 2023-02-26 NOTE — Telephone Encounter (Signed)
  Follow up Call-     02/25/2023    7:36 AM  Call back number  Post procedure Call Back phone  # [phone]  Permission to leave phone message Yes     Patient questions:  Do you have a fever, pain , or abdominal swelling? No. Pain Score  0 *  Have you tolerated food without any problems? Yes.    Have you been able to return to your normal activities? Yes.    Do you have any questions about your discharge instructions: Diet   No. Medications  No. Follow up visit  No.  Do you have questions or concerns about your Care? No.  Actions: * If pain score is 4 or above: No action needed, pain <4.

## 2023-03-12 ENCOUNTER — Encounter: Payer: Self-pay | Admitting: Gastroenterology

## 2023-03-12 ENCOUNTER — Ambulatory Visit (AMBULATORY_SURGERY_CENTER): Payer: BC Managed Care – PPO | Admitting: Gastroenterology

## 2023-03-12 VITALS — BP 153/83 | HR 60 | Temp 97.2°F | Resp 11 | Ht 67.0 in | Wt 170.0 lb

## 2023-03-12 DIAGNOSIS — Z1211 Encounter for screening for malignant neoplasm of colon: Secondary | ICD-10-CM | POA: Diagnosis present

## 2023-03-12 DIAGNOSIS — Z800 Family history of malignant neoplasm of digestive organs: Secondary | ICD-10-CM | POA: Diagnosis not present

## 2023-03-12 MED ORDER — SODIUM CHLORIDE 0.9 % IV SOLN: 500.0000 mL | Freq: Once | INTRAVENOUS | Status: DC

## 2023-03-12 NOTE — Progress Notes (Signed)
[facility] History and Physical   Primary Care Physician:  Corwin Levins, MD   Reason for Procedure:   Family history of colon cancer  Plan:    colonoscopy     HPI: Penny Yates is a 67 y.o. female  here for colonoscopy screening - mother had colon cancer dx age 25. Her last exam in 11/2017 was normal. Follow up exam 02/25/23 was aborted due to poor prep. Double prep used for this exam.    Patient denies any bowel symptoms at this time other than occasional constipation. Otherwise feels well without any cardiopulmonary symptoms.   I have discussed risks / benefits of anesthesia and endoscopic procedure with Threasa Beards and they wish to proceed with the exams as outlined today.    Past Medical History:  Diagnosis Date   Anemia    Chronic venous insufficiency    LE's   Depression    pt denies   Diabetes mellitus without complication (HCC)    no meds  pt. denies states she is pre diabetic    Fracture of shaft of right radius 01/06/2013   History of colonic polyps    multiple of succeeding years 96, 97, 98, and 99   Hyperlipidemia    Hypertension    Hyperthyroidism    s/p rad Iodine-ellison   Hypothyroidism 01/19/2015   Leg length discrepancy    right leg longer   Low back pain    Lymphedema    left arm, both legs; primary lymphedema   Lymphedema    legs   Neuromuscular disorder (HCC)    sciatiaca   Osteoporosis    Sacroiliitis (HCC)    history of    Past Surgical History:  Procedure Laterality Date   [place]  [place]   EYE SURGERY     both eyes-muscle repair  cataract right eye   OPEN REDUCTION INTERNAL FIXATION (ORIF) DISTAL RADIAL FRACTURE Right 01/06/2013   Procedure: RIGHT OPEN REDUCTION INTERNAL FIXATION (ORIF) DISTAL ARTICULATING RADIAL FRACTURE ;  Surgeon: Eulas Post, MD;  Location: [facility];  Service: Orthopedics;  Laterality: Right;   REPLACEMENT TOTAL KNEE  june 2007   right   REVISION OF SCAR TISSUE  RECTUS MUSCLE  11/08   right knee   TOTAL KNEE REVISION Right 04/13/2020   Procedure: Right knee polyethylene revision.;  Surgeon: Ollen Gross, MD;  Location: [facility];  Service: Orthopedics;  Laterality: Right;    TUBAL LIGATION      Prior to Admission medications   Medication Sig Start Date End Date Taking? Authorizing Provider  ALPHAGAN P 0.1 % SOLN Place 1 drop into both eyes in the morning and at bedtime.  05/31/16  Yes [provider]  amitriptyline (ELAVIL) 100 MG tablet TAKE 1/2 TO 1 TABLET BY MOUTH AT BEDTIME AS NEEDED 01/22/23  Yes Corwin Levins, MD  azaTHIOprine (IMURAN) 50 MG tablet Take by mouth. 03/05/23  Yes [provider]  diltiazem (CARDIZEM CD) 360 MG 24 hr capsule Take 1 capsule (360 mg total) by mouth daily. 01/22/23  Yes Corwin Levins, MD  dorzolamide-timolol (COSOPT) 22.3-6.8 MG/ML ophthalmic solution Place 1 drop into both eyes 2 (two) times daily.    Yes [provider]  guanFACINE (TENEX) 2 MG tablet TAKE 1 TABLET BY MOUTH EVERYDAY AT BEDTIME 09/24/22  Yes Corwin Levins, MD  hydrALAZINE (APRESOLINE) 100 MG tablet Take 1 tablet (100 mg total) by mouth 2 (two) times daily. 01/22/23  Yes  Corwin Levins, MD  irbesartan (AVAPRO) 300 MG tablet Take 1 tablet (300 mg total) by mouth daily. 01/22/23  Yes Corwin Levins, MD  latanoprost (XALATAN) 0.005 % ophthalmic solution Place 1 drop into both eyes at bedtime. 09/12/17  Yes [provider]  levothyroxine (SYNTHROID) 100 MCG tablet Take 1 tablet (100 mcg total) by mouth daily. 01/22/23  Yes Corwin Levins, MD  ondansetron (ZOFRAN) 4 MG tablet Take 1 tablet (4 mg total) by mouth as directed for 4 doses. Take one Zofran 4 mg tablet 30-60 minutes each prep dose 02/25/23  Yes Kashis Penley, Willaim Rayas, MD  potassium chloride (KLOR-CON M10) 10 MEQ tablet Take 1 tablet (10 mEq total) by mouth daily. 01/22/23  Yes Corwin Levins, MD  traMADol (ULTRAM-ER) 100 MG 24 hr tablet TAKE 1 TABLET BY MOUTH EVERY DAY  02/26/23  Yes Corwin Levins, MD  alendronate (FOSAMAX) 70 MG tablet TAKE 1 TABLET BY MOUTH EVERY 7 DAYS. TAKE WITH A FULL GLASS OF WATER ON AN EMPTY STOMACH. 01/22/23   Corwin Levins, MD  amoxicillin (AMOXIL) 500 MG capsule TAKE 2 CAPSULES BY MOUTH 1 HOUR PRIOR TO DENTAL PROCEDURE, THEN TAKE 2 CAPSULES 4 HOURS AFTER 08/07/22   Corwin Levins, MD  ascorbic acid (VITAMIN C) 500 MG tablet Take 500 mg by mouth daily.    [provider]  atorvastatin (LIPITOR) 10 MG tablet Take 1 tablet (10 mg total) by mouth daily. 01/22/23   Corwin Levins, MD  Biotin 5000 MCG CAPS Take 5,000 mcg by mouth daily.    [provider]  cetirizine (ZYRTEC ALLERGY) 10 MG tablet Take 0.5 tablets (5 mg total) by mouth daily. 06/08/22   Rising, Lurena Joiner, PA-C  cholecalciferol (VITAMIN D3) 25 MCG (1000 UNIT) tablet Take 1,000 Units by mouth daily.    [provider]  diclofenac Sodium (VOLTAREN) 1 % GEL Apply 2 g topically 4 (four) times daily as needed (pain).     [provider]  fish oil-omega-3 fatty acids 1000 MG capsule Take 1 g by mouth daily.    [provider]  furosemide (LASIX) 40 MG tablet Take 1 tablet (40 mg total) by mouth daily. Patient taking differently: Take 40 mg by mouth daily. As needed 01/22/23   Corwin Levins, MD  Multiple Vitamin (MULTIVITAMIN) capsule Take 1 capsule by mouth daily.    [provider]  Probiotic Product (PHILLIPS COLON HEALTH PO) Take 1 capsule by mouth daily.     [provider]    Current Outpatient Medications  Medication Sig Dispense Refill   ALPHAGAN P 0.1 % SOLN Place 1 drop into both eyes in the morning and at bedtime.   11   amitriptyline (ELAVIL) 100 MG tablet TAKE 1/2 TO 1 TABLET BY MOUTH AT BEDTIME AS NEEDED 90 tablet 1   azaTHIOprine (IMURAN) 50 MG tablet Take by mouth.     diltiazem (CARDIZEM CD) 360 MG 24 hr capsule Take 1 capsule (360 mg total) by mouth daily. 90 capsule 3   dorzolamide-timolol (COSOPT) 22.3-6.8  MG/ML ophthalmic solution Place 1 drop into both eyes 2 (two) times daily.      guanFACINE (TENEX) 2 MG tablet TAKE 1 TABLET BY MOUTH EVERYDAY AT BEDTIME 90 tablet 2   hydrALAZINE (APRESOLINE) 100 MG tablet Take 1 tablet (100 mg total) by mouth 2 (two) times daily. 180 tablet 3   irbesartan (AVAPRO) 300 MG tablet Take 1 tablet (300 mg total) by mouth daily. 90  tablet 3   latanoprost (XALATAN) 0.005 % ophthalmic solution Place 1 drop into both eyes at bedtime.  4   levothyroxine (SYNTHROID) 100 MCG tablet Take 1 tablet (100 mcg total) by mouth daily. 90 tablet 3   ondansetron (ZOFRAN) 4 MG tablet Take 1 tablet (4 mg total) by mouth as directed for 4 doses. Take one Zofran 4 mg tablet 30-60 minutes each prep dose 4 tablet 0   potassium chloride (KLOR-CON M10) 10 MEQ tablet Take 1 tablet (10 mEq total) by mouth daily. 90 tablet 3   traMADol (ULTRAM-ER) 100 MG 24 hr tablet TAKE 1 TABLET BY MOUTH EVERY DAY 30 tablet 3   alendronate (FOSAMAX) 70 MG tablet TAKE 1 TABLET BY MOUTH EVERY 7 DAYS. TAKE WITH A FULL GLASS OF WATER ON AN EMPTY STOMACH. 12 tablet 3   amoxicillin (AMOXIL) 500 MG capsule TAKE 2 CAPSULES BY MOUTH 1 HOUR PRIOR TO DENTAL PROCEDURE, THEN TAKE 2 CAPSULES 4 HOURS AFTER 4 capsule 2   ascorbic acid (VITAMIN C) 500 MG tablet Take 500 mg by mouth daily.     atorvastatin (LIPITOR) 10 MG tablet Take 1 tablet (10 mg total) by mouth daily. 90 tablet 3   Biotin 5000 MCG CAPS Take 5,000 mcg by mouth daily.     cetirizine (ZYRTEC ALLERGY) 10 MG tablet Take 0.5 tablets (5 mg total) by mouth daily. 30 tablet 2   cholecalciferol (VITAMIN D3) 25 MCG (1000 UNIT) tablet Take 1,000 Units by mouth daily.     diclofenac Sodium (VOLTAREN) 1 % GEL Apply 2 g topically 4 (four) times daily as needed (pain).      fish oil-omega-3 fatty acids 1000 MG capsule Take 1 g by mouth daily.     furosemide (LASIX) 40 MG tablet Take 1 tablet (40 mg total) by mouth daily. (Patient taking differently: Take 40 mg by mouth  daily. As needed) 90 tablet 3   Multiple Vitamin (MULTIVITAMIN) capsule Take 1 capsule by mouth daily.     Probiotic Product (PHILLIPS COLON HEALTH PO) Take 1 capsule by mouth daily.      Current Facility-Administered Medications  Medication Dose Route Frequency Provider Last Rate Last Admin   0.9 %  sodium chloride infusion  500 mL Intravenous Once Dayanis Bergquist, Willaim Rayas, MD        Allergies as of 03/12/2023 - Review Complete 03/12/2023  Allergen Reaction Noted   Ace inhibitors Cough 02/12/2012   Levaquin [levofloxacin] Rash 05/25/2022   Lisinopril Other (See Comments) 10/21/2012    Family History  Problem Relation Age of Onset   Colon cancer Mother 65   Diabetes Mother    Cancer Mother        Colon Cancer   Heart disease Maternal Grandmother    Diabetes Other        mother and several others in family   Stroke Other    Kidney disease Other    Thyroid disease Neg Hx    Esophageal cancer Neg Hx    Rectal cancer Neg Hx    Stomach cancer Neg Hx    Colon polyps Neg Hx     Social History   Socioeconomic History   Marital status: Married    Spouse name: Not on file   Number of children: 2   Years of education: Not on file   Highest education level: Not on file  Occupational History   Not on file  Tobacco Use   Smoking status: Never   Smokeless tobacco: Never  Vaping Use   Vaping status: Never Used  Substance and Sexual Activity   Alcohol use: No   Drug use: No   Sexual activity: Not Currently  Other Topics Concern   Not on file  Social History Narrative   Moved to [place] in Jan 2009. Married with children. 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 Looking for a new job.      Patient has two sons. One is deceased.   Right Handed    Lives in a two story home    Social Determinants of Health   Financial Resource Strain: Not on file  Food Insecurity: Not on file  Transportation Needs: Not on file   Physical Activity: Not on file  Stress: Not on file  Social Connections: Not on file  Intimate Partner Violence: Not on file    Review of Systems: All other review of systems negative except as mentioned in the HPI.  Physical Exam: Vital signs BP 136/83   Pulse 84   Temp (!) 97.2 F (36.2 C)   Ht 5\' 7"  (1.702 m)   Wt 170 lb (77.1 kg)   SpO2 99%   BMI 26.63 kg/m   General:   Alert,  Well-developed, pleasant and cooperative in NAD Lungs:  Clear throughout to auscultation.   Heart:  Regular rate and rhythm Abdomen:  Soft, nontender and nondistended.   Neuro/Psych:  Alert and cooperative. Normal mood and affect. A and O x 3  Harlin Rain, MD [facility]

## 2023-03-12 NOTE — Patient Instructions (Signed)
Handout provided on diverticulosis.  Resume previous diet.  Continue present medications.  Repeat colonoscopy in 5 years for screening purposes.   YOU HAD AN ENDOSCOPIC PROCEDURE TODAY AT THE [facility]:   Refer to the procedure report that was given to you for any specific questions about what was found during the examination.  If the procedure report does not answer your questions, please call your gastroenterologist to clarify.  If you requested that your care partner not be given the details of your procedure findings, then the procedure report has been included in a sealed envelope for you to review at your convenience later.  YOU SHOULD EXPECT: Some feelings of bloating in the abdomen. Passage of more gas than usual.  Walking can help get rid of the air that was put into your GI tract during the procedure and reduce the bloating. If you had a lower endoscopy (such as a colonoscopy or flexible sigmoidoscopy) you may notice spotting of blood in your stool or on the toilet paper. If you underwent a bowel prep for your procedure, you may not have a normal bowel movement for a few days.  Please Note:  You might notice some irritation and congestion in your nose or some drainage.  This is from the oxygen used during your procedure.  There is no need for concern and it should clear up in a day or so.  SYMPTOMS TO REPORT IMMEDIATELY:  Following lower endoscopy (colonoscopy or flexible sigmoidoscopy):  Excessive amounts of blood in the stool  Significant tenderness or worsening of abdominal pains  Swelling of the abdomen that is new, acute  Fever of 100F or higher  For urgent or emergent issues, a gastroenterologist can be reached at any hour by calling [phone]. Do not use MyChart messaging for urgent concerns.    DIET:  We do recommend a small meal at first, but then you may proceed to your regular diet.  Drink plenty of fluids but you should avoid alcoholic beverages for  24 hours.  ACTIVITY:  You should plan to take it easy for the rest of today and you should NOT DRIVE or use heavy machinery until tomorrow (because of the sedation medicines used during the test).    FOLLOW UP: Our staff will call the number listed on your records the next business day following your procedure.  We will call around 7:15- 8:00 am to check on you and address any questions or concerns that you may have regarding the information given to you following your procedure. If we do not reach you, we will leave a message.     If any biopsies were taken you will be contacted by phone or by letter within the next 1-3 weeks.  Please call us at [phone] if you have not heard about the biopsies in 3 weeks.    SIGNATURES/CONFIDENTIALITY: You and/or your care partner have signed paperwork which will be entered into your electronic medical record.  These signatures attest to the fact that that the information above on your After Visit Summary has been reviewed and is understood.  Full responsibility of the confidentiality of this discharge information lies with you and/or your care-partner.

## 2023-03-12 NOTE — Progress Notes (Signed)
Pt's states no medical or surgical changes since previsit or office visit. 

## 2023-03-12 NOTE — Op Note (Signed)
[facility] Patient Name: Penny Yates Procedure Date: 03/12/2023 9:15 AM MRN: [id number] Endoscopist: Viviann Spare P. Adela Lank , MD, [id number] Age: 67 Referring MD:  Date of Birth: [birth year] Gender: Female Account #: [id number] Procedure:                Colonoscopy Indications:              Screening in patient at increased risk: Family                            history of 1st-degree relative with colorectal                            cancer (mother dx age 42). Last exam 02/25/23                            limited by prep. 2 day / double prep used for this                            exam Medicines:                Monitored Anesthesia Care Procedure:                Pre-Anesthesia Assessment:                           - Prior to the procedure, a History and Physical                            was performed, and patient medications and                            allergies were reviewed. The patient's tolerance of                            previous anesthesia was also reviewed. The risks                            and benefits of the procedure and the sedation                            options and risks were discussed with the patient.                            All questions were answered, and informed consent                            was obtained. Prior Anticoagulants: The patient has                            taken no anticoagulant or antiplatelet agents. ASA                            Grade Assessment: III - A patient with severe  systemic disease. After reviewing the risks and                            benefits, the patient was deemed in satisfactory                            condition to undergo the procedure.                           After obtaining informed consent, the colonoscope                            was passed under direct vision. Throughout the                            procedure, the patient's blood pressure, pulse, and                             oxygen saturations were monitored continuously. The                            Olympus Scope [id number] was introduced through the                            anus and advanced to the the cecum, identified by                            appendiceal orifice and ileocecal valve. The                            colonoscopy was performed without difficulty. The                            patient tolerated the procedure well. The quality                            of the bowel preparation was adequate. The                            ileocecal valve, appendiceal orifice, and rectum                            were photographed. Scope In: 9:16:11 AM Scope Out: 9:35:21 AM Scope Withdrawal Time: 0 hours 13 minutes 12 seconds  Total Procedure Duration: 0 hours 19 minutes 10 seconds  Findings:                 The perianal and digital rectal examinations were                            normal.                           Scattered small-mouthed diverticula were found in  the left colon and right colon.                           Anal papilla(e) were hypertrophied.                           The colon was tortuous. There was some residual                            stool in dependant portions of the colon but was                            able to be cleared with lavage.                           The exam was otherwise without abnormality. Complications:            No immediate complications. Estimated blood loss:                            None. Estimated Blood Loss:     Estimated blood loss: none. Impression:               - Diverticulosis in the left colon and in the right                            colon.                           - Anal papilla(e) were hypertrophied.                           - Tortuous colon.                           - The examination was otherwise normal.                           - No polyps. Recommendation:           - Patient has  a contact number available for                            emergencies. The signs and symptoms of potential                            delayed complications were discussed with the                            patient. Return to normal activities tomorrow.                            Written discharge instructions were provided to the                            patient.                           -  Resume previous diet.                           - Continue present medications.                           - Repeat colonoscopy in 5 years for screening                            purposes - use double prep for the next exam. Viviann Spare P. Shawnelle Spoerl, MD 03/12/2023 9:40:28 AM This report has been signed electronically.

## 2023-03-12 NOTE — Progress Notes (Signed)
Vss nad trans to pacu 

## 2023-03-13 ENCOUNTER — Telehealth: Payer: Self-pay | Admitting: *Deleted

## 2023-03-13 NOTE — Telephone Encounter (Signed)
Post procedure follow up call placed, no answer and left VM.  

## 2023-04-25 ENCOUNTER — Other Ambulatory Visit: Payer: Self-pay | Admitting: Gastroenterology

## 2023-04-25 DIAGNOSIS — Z860100 Personal history of colon polyps, unspecified: Secondary | ICD-10-CM

## 2023-05-10 ENCOUNTER — Ambulatory Visit: Payer: 59 | Attending: Internal Medicine | Admitting: Internal Medicine

## 2023-05-10 VITALS — BP 142/58 | HR 94 | Ht 68.0 in | Wt 177.4 lb

## 2023-05-10 DIAGNOSIS — I10 Essential (primary) hypertension: Secondary | ICD-10-CM | POA: Diagnosis not present

## 2023-05-10 DIAGNOSIS — E782 Mixed hyperlipidemia: Secondary | ICD-10-CM | POA: Diagnosis not present

## 2023-05-10 DIAGNOSIS — I700 Atherosclerosis of aorta: Secondary | ICD-10-CM | POA: Insufficient documentation

## 2023-05-10 DIAGNOSIS — I890 Lymphedema, not elsewhere classified: Secondary | ICD-10-CM

## 2023-05-10 MED ORDER — HYDRALAZINE HCL 100 MG PO TABS: 100.0000 mg | ORAL_TABLET | Freq: Three times a day (TID) | ORAL | 3 refills | Status: DC

## 2023-05-10 NOTE — Patient Instructions (Signed)
Medication Instructions:  Your physician has recommended you make the following change in your medication:  TAKE hydralazine 100 mg by mouth three times daily  *If you need a refill on your cardiac medications before your next appointment, please call your pharmacy*   Lab Work: NONE  If you have labs (blood work) drawn today and your tests are completely normal, you will receive your results only by: MyChart Message (if you have MyChart) OR A paper copy in the mail If you have any lab test that is abnormal or we need to change your treatment, we will call you to review the results.   Testing/Procedures: NONE   Follow-Up:As needed  At [facility], you and your health needs are our priority.  As part of our continuing mission to provide you with exceptional heart care, we have created designated Provider Care Teams.  These Care Teams include your primary Cardiologist (physician) and Advanced Practice Providers (APPs -  Physician Assistants and Nurse Practitioners) who all work together to provide you with the care you need, when you need it.   Provider:   Riley Lam, MD

## 2023-05-10 NOTE — Progress Notes (Signed)
Cardiology Office Note:  .    Date:  05/10/2023  ID:  Penny Yates, DOB 10-07-1955, MRN [id number] PCP: Corwin Levins, MD  [name] Providers Cardiologist:  None     CC: Told to come Consulted for the evaluation of HTN at the behest of [name]   History of Present Illness: .    Penny Yates is a 68 y.o. female  with a history of hypertension, diabetes, and non-cardiac syncope, was initially seen in 2021 for a preoperative evaluation for a knee replacement. The patient reports that the second knee replacement did not yield as successful results as the first one. The patient was prompted to return for a follow-up due to a system-generated reminder (last seen in 2021, came in 2025).  The patient denies any current heart issues or symptoms such as chest pain or breathing difficulties. However, she reports that her blood pressure readings at home are usually higher than those recorded in the clinic, particularly the diastolic reading. The patient has a long-standing history of hypertension, dating back to the age of 48.  The patient experienced an adverse reaction to levofloxacin, an antibiotic prescribed for bilateral ear infections, which resulted in a widespread rash. The patient's cholesterol levels are well-controlled on atorvastatin. She is also on viltiazem, irbesartan, and hydralazine for blood pressure management. The patient has a history of lymphedema, which causes chronic leg swelling.  The patient reports difficulty with stair climbing, attributing it to a lack of regular exercise. She expresses concern about potential stress on her heart due to this difficulty. The patient's sleep is reportedly disturbed by snoring, which her spouse describes as sounding like a "freight train."  The patient's calcium score from the previous year was zero, indicating no calcium buildup in the coronary arteries. However, there was evidence of aortic atherosclerosis. The patient's kidney  function was reported as good in a recent October evaluation.  The patient was already on Lasix, another diuretic, which had been reduced from 80mg  to 40mg  due to kidney concerns.     Relevant histories: .  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 ROS: As per HPI.   Studies Reviewed: .   Cardiac Studies & Procedures         CT SCANS  CT CARDIAC SCORING (SELF PAY ONLY) 05/08/2022  Addendum 05/08/2022  4:11 PM ADDENDUM REPORT: 05/08/2022 16:09  EXAM: OVER-READ INTERPRETATION  CT CHEST  The following report is an over-read performed by radiologist Dr. [facility] on 05/08/2022. This over-read does not include interpretation of cardiac or coronary anatomy or pathology. The coronary calcium score interpretation by the cardiologist is attached.  COMPARISON:  None.  FINDINGS: Normal caliber ascending thoracic aorta. Scattered aortic calcifications.  No mediastinal or hilar mass or lymphadenopathy. The esophagus is grossly normal.  No acute pulmonary findings. No pulmonary lesions or nodules. No pleural effusions.  No significant upper abdominal findings.  No significant bony findings.  IMPRESSION: 1. No acute or significant extracardiac findings. 2. Aortic atherosclerosis.  Aortic Atherosclerosis (ICD10-I70.0).   Electronically Signed By: Rudie Meyer M.D. On: 05/08/2022 16:09  Narrative CLINICAL DATA:  Cardiovascular Disease Risk stratification  EXAM: Coronary Calcium Score  TECHNIQUE: A gated, non-contrast computed tomography scan of the heart was performed using 3mm slice thickness. Axial images were analyzed on a dedicated workstation. Calcium scoring of the coronary arteries was performed using the Agatston method.  FINDINGS: Coronary Calcium Score:  Left main: 0  Left anterior  descending artery: 0  Left circumflex artery: 0  Right coronary artery: 0  Total: 0  Percentile: 0  Pericardium:  Normal.  Ascending Aorta: Normal caliber.  Aortic atherosclerosis.  Non-cardiac: See separate report from [facility].  IMPRESSION: Coronary calcium score of 0. This was 0 percentile for age-, race-, and sex-matched controls.  Aortic atherosclerosis.  RECOMMENDATIONS: Coronary artery calcium (CAC) score is a strong predictor of incident coronary heart disease (CHD) and provides predictive information beyond traditional risk factors. CAC scoring is reasonable to use in the decision to withhold, postpone, or initiate statin therapy in intermediate-risk or selected borderline-risk asymptomatic adults (age 54-75 years and LDL-C >=70 to <190 mg/dL) who do not have diabetes or established atherosclerotic cardiovascular disease (ASCVD).* In intermediate-risk (10-year ASCVD risk >=7.5% to <20%) adults or selected borderline-risk (10-year ASCVD risk >=5% to <7.5%) adults in whom a CAC score is measured for the purpose of making a treatment decision the following recommendations have been made:  If CAC=0, it is reasonable to withhold statin therapy and reassess in 5 to 10 years, as long as higher risk conditions are absent (diabetes mellitus, family history of premature CHD in first degree relatives (males <55 years; females <65 years), cigarette smoking, or LDL >=190 mg/dL).  If CAC is 1 to 99, it is reasonable to initiate statin therapy for patients >=47 years of age.  If CAC is >=100 or >=75th percentile, it is reasonable to initiate statin therapy at any age.  Cardiology referral should be considered for patients with CAC scores >=400 or >=75th percentile.  *2018 AHA/ACC/AACVPR/AAPA/ABC/ACPM/ADA/AGS/APhA/ASPC/NLA/PCNA Guideline on the Management of Blood Cholesterol: A Report of the American College of Cardiology/American Heart Association Task Force on Clinical Practice Guidelines. J Am Coll Cardiol. 2019;73(24):3168-3209.  Olga Millers, MD  Electronically  Signed: By: Olga Millers M.D. On: 05/08/2022 15:51          Physical Exam:    VS:  BP (!) 142/58 (BP Location: Left Arm)   Pulse 94   Ht 5\' 8"  (1.727 m)   Wt 177 lb 6.4 oz (80.5 kg)   SpO2 98%   BMI 26.97 kg/m    Wt Readings from Last 3 Encounters:  05/10/23 177 lb 6.4 oz (80.5 kg)  03/12/23 170 lb (77.1 kg)  02/25/23 170 lb (77.1 kg)    Gen: no distress   Neck: No JVD Cardiac: No Rubs or Gallops, no Murmur, RRR +2 radial pulses Respiratory: Clear to auscultation bilaterally, normal effort, normal  respiratory rate GI: Soft, nontender, non-distended  MS: chronic lymphedema;  moves all extremities Integument: Skin feels warm Neuro:  At time of evaluation, alert and oriented to person/place/time/situation  Psych: Normal affect, patient feels ok   ASSESSMENT AND PLAN: .    Hypertension with diabetes Chronic hypertension with elevated blood pressure. Currently on irbesartan 300 mg, diltiazem 360 mg, and hydralazine twice daily. Difficulty adhering to midday hydralazine dose due to work schedule. Kidney function was good in October. Discussed increasing hydralazine to three times daily or adding spironolactone. Patient prefers increasing hydralazine frequency. - Adjust hydralazine to three times daily - continue other medications.   Aortic Atherosclerosis Hyperlipidemia Aortic atherosclerosis noted on previous calcium score test. No coronary artery calcium buildup. Well-controlled hyperlipidemia on atorvastatin.; LDL at goal; CT reviewed.   Lymphedema - Continue current management (lasix as per nephrology)  Follow-up - Follow up PRN.   Riley Lam, MD FASE [facility] Cardiologist [facility]  [address] [place], [address] [place], [place]  [address] [phone]  10:41 AM

## 2023-05-27 ENCOUNTER — Other Ambulatory Visit: Payer: Self-pay | Admitting: Internal Medicine

## 2023-05-28 ENCOUNTER — Other Ambulatory Visit: Payer: Self-pay

## 2023-06-26 ENCOUNTER — Other Ambulatory Visit: Payer: Self-pay | Admitting: Internal Medicine

## 2023-08-14 LAB — HM DIABETES EYE EXAM

## 2023-08-25 ENCOUNTER — Other Ambulatory Visit: Payer: Self-pay | Admitting: Internal Medicine

## 2023-08-26 ENCOUNTER — Other Ambulatory Visit: Payer: Self-pay | Admitting: Internal Medicine

## 2023-09-22 ENCOUNTER — Other Ambulatory Visit: Payer: Self-pay | Admitting: Internal Medicine

## 2023-09-23 ENCOUNTER — Other Ambulatory Visit: Payer: Self-pay

## 2023-09-30 ENCOUNTER — Other Ambulatory Visit: Payer: Self-pay | Admitting: Gastroenterology

## 2023-09-30 ENCOUNTER — Other Ambulatory Visit: Payer: Self-pay | Admitting: Internal Medicine

## 2023-09-30 DIAGNOSIS — Z860100 Personal history of colon polyps, unspecified: Secondary | ICD-10-CM

## 2023-10-05 ENCOUNTER — Other Ambulatory Visit: Payer: Self-pay | Admitting: Internal Medicine

## 2023-10-10 ENCOUNTER — Encounter: Admitting: Internal Medicine

## 2023-10-11 ENCOUNTER — Telehealth: Payer: Self-pay | Admitting: Internal Medicine

## 2023-10-11 ENCOUNTER — Other Ambulatory Visit

## 2023-10-11 DIAGNOSIS — I10 Essential (primary) hypertension: Secondary | ICD-10-CM

## 2023-10-11 DIAGNOSIS — E559 Vitamin D deficiency, unspecified: Secondary | ICD-10-CM

## 2023-10-11 DIAGNOSIS — E538 Deficiency of other specified B group vitamins: Secondary | ICD-10-CM | POA: Diagnosis not present

## 2023-10-11 DIAGNOSIS — E119 Type 2 diabetes mellitus without complications: Secondary | ICD-10-CM

## 2023-10-11 LAB — LIPID PANEL
Cholesterol: 174 mg/dL (ref 0–200)
HDL: 79.2 mg/dL (ref >39.00)
LDL Cholesterol: 83 mg/dL (ref 0–99)
NonHDL: 94.75
Total CHOL/HDL Ratio: 2
Triglycerides: 61 mg/dL (ref 0.0–149.0)
VLDL: 12.2 mg/dL (ref 0.0–40.0)

## 2023-10-11 LAB — CBC WITH DIFFERENTIAL/PLATELET
Basophils Absolute: 0.1 10*3/uL (ref 0.0–0.1)
Basophils Relative: 2.8 % (ref 0.0–3.0)
Eosinophils Absolute: 0.1 10*3/uL (ref 0.0–0.7)
Eosinophils Relative: 4.2 % (ref 0.0–5.0)
HCT: 34.6 % — ABNORMAL LOW (ref 36.0–46.0)
Hemoglobin: 12.1 g/dL (ref 12.0–15.0)
Lymphocytes Relative: 35.4 % (ref 12.0–46.0)
Lymphs Abs: 1.1 10*3/uL (ref 0.7–4.0)
MCHC: 35 g/dL (ref 30.0–36.0)
MCV: 105.2 fl — ABNORMAL HIGH (ref 78.0–100.0)
Monocytes Absolute: 0.3 10*3/uL (ref 0.1–1.0)
Monocytes Relative: 9.3 % (ref 3.0–12.0)
Neutro Abs: 1.5 10*3/uL (ref 1.4–7.7)
Neutrophils Relative %: 48.3 % (ref 43.0–77.0)
Platelets: 269 10*3/uL (ref 150.0–400.0)
RBC: 3.29 Mil/uL — ABNORMAL LOW (ref 3.87–5.11)
RDW: 16.6 % — ABNORMAL HIGH (ref 11.5–15.5)
WBC: 3 10*3/uL — ABNORMAL LOW (ref 4.0–10.5)

## 2023-10-11 LAB — MICROALBUMIN / CREATININE URINE RATIO
Creatinine,U: 227.5 mg/dL
Microalb Creat Ratio: 6.7 mg/g (ref 0.0–30.0)
Microalb, Ur: 1.5 mg/dL (ref 0.0–1.9)

## 2023-10-11 LAB — BASIC METABOLIC PANEL WITH GFR
BUN: 15 mg/dL (ref 6–23)
CO2: 29 meq/L (ref 19–32)
Calcium: 9.7 mg/dL (ref 8.4–10.5)
Chloride: 104 meq/L (ref 96–112)
Creatinine, Ser: 0.88 mg/dL (ref 0.40–1.20)
GFR: 66.8 mL/min (ref >60.00)
Glucose, Bld: 108 mg/dL — ABNORMAL HIGH (ref 70–99)
Potassium: 4.8 meq/L (ref 3.5–5.1)
Sodium: 139 meq/L (ref 135–145)

## 2023-10-11 LAB — HEPATIC FUNCTION PANEL
ALT: 24 U/L (ref 0–35)
AST: 28 U/L (ref 0–37)
Albumin: 3.8 g/dL (ref 3.5–5.2)
Alkaline Phosphatase: 83 U/L (ref 39–117)
Bilirubin, Direct: 0 mg/dL (ref 0.0–0.3)
Total Bilirubin: 0.4 mg/dL (ref 0.2–1.2)
Total Protein: 7.1 g/dL (ref 6.0–8.3)

## 2023-10-11 LAB — URINALYSIS, ROUTINE W REFLEX MICROSCOPIC
Bilirubin Urine: NEGATIVE
Hgb urine dipstick: NEGATIVE
Leukocytes,Ua: NEGATIVE
Nitrite: NEGATIVE
Specific Gravity, Urine: 1.02 (ref 1.000–1.030)
Urine Glucose: NEGATIVE
Urobilinogen, UA: 1 (ref 0.0–1.0)
pH: 6.5 (ref 5.0–8.0)

## 2023-10-11 LAB — VITAMIN D 25 HYDROXY (VIT D DEFICIENCY, FRACTURES): VITD: 35.97 ng/mL (ref 30.00–100.00)

## 2023-10-11 LAB — HEMOGLOBIN A1C: Hgb A1c MFr Bld: 6.2 % (ref 4.6–6.5)

## 2023-10-11 LAB — TSH: TSH: 5.42 u[IU]/mL (ref 0.35–5.50)

## 2023-10-11 LAB — VITAMIN B12: Vitamin B-12: 614 pg/mL (ref 211–911)

## 2023-10-11 NOTE — Telephone Encounter (Signed)
 Ok labs are ordered

## 2023-10-11 NOTE — Telephone Encounter (Signed)
 Copied from CRM [id number]. Topic: Clinical - Lab/Test Results >> Oct 11, 2023  9:40 AM Oddis Bench wrote: Reason for CRM: Patient is calling to request to have Dr send blood work request to lab on [address] so it will be in when she comes to her appt on Monday.

## 2023-10-11 NOTE — Telephone Encounter (Signed)
 Called and let Pt know

## 2023-10-14 ENCOUNTER — Ambulatory Visit (INDEPENDENT_AMBULATORY_CARE_PROVIDER_SITE_OTHER): Admitting: Internal Medicine

## 2023-10-14 ENCOUNTER — Encounter: Payer: Self-pay | Admitting: Internal Medicine

## 2023-10-14 VITALS — BP 142/86 | HR 65 | Temp 98.7°F | Ht 68.0 in | Wt 176.0 lb

## 2023-10-14 DIAGNOSIS — Z0001 Encounter for general adult medical examination with abnormal findings: Secondary | ICD-10-CM

## 2023-10-14 DIAGNOSIS — M533 Sacrococcygeal disorders, not elsewhere classified: Secondary | ICD-10-CM | POA: Insufficient documentation

## 2023-10-14 DIAGNOSIS — E7800 Pure hypercholesterolemia, unspecified: Secondary | ICD-10-CM

## 2023-10-14 DIAGNOSIS — E559 Vitamin D deficiency, unspecified: Secondary | ICD-10-CM | POA: Diagnosis not present

## 2023-10-14 DIAGNOSIS — M25473 Effusion, unspecified ankle: Secondary | ICD-10-CM | POA: Insufficient documentation

## 2023-10-14 DIAGNOSIS — H209 Unspecified iridocyclitis: Secondary | ICD-10-CM | POA: Insufficient documentation

## 2023-10-14 DIAGNOSIS — H5702 Anisocoria: Secondary | ICD-10-CM | POA: Insufficient documentation

## 2023-10-14 DIAGNOSIS — I10 Essential (primary) hypertension: Secondary | ICD-10-CM

## 2023-10-14 DIAGNOSIS — R0683 Snoring: Secondary | ICD-10-CM | POA: Diagnosis not present

## 2023-10-14 DIAGNOSIS — Z0000 Encounter for general adult medical examination without abnormal findings: Secondary | ICD-10-CM

## 2023-10-14 DIAGNOSIS — M810 Age-related osteoporosis without current pathological fracture: Secondary | ICD-10-CM

## 2023-10-14 DIAGNOSIS — M2550 Pain in unspecified joint: Secondary | ICD-10-CM | POA: Insufficient documentation

## 2023-10-14 DIAGNOSIS — E119 Type 2 diabetes mellitus without complications: Secondary | ICD-10-CM

## 2023-10-14 MED ORDER — GUANFACINE HCL 2 MG PO TABS: ORAL_TABLET | ORAL | 3 refills | Status: DC

## 2023-10-14 MED ORDER — ATORVASTATIN CALCIUM 10 MG PO TABS: 10.0000 mg | ORAL_TABLET | Freq: Every day | ORAL | 3 refills | Status: DC

## 2023-10-14 MED ORDER — FUROSEMIDE 40 MG PO TABS: 40.0000 mg | ORAL_TABLET | Freq: Every day | ORAL | 3 refills | Status: DC | PRN

## 2023-10-14 MED ORDER — IRBESARTAN 300 MG PO TABS: 300.0000 mg | ORAL_TABLET | Freq: Every day | ORAL | 3 refills | Status: DC

## 2023-10-14 MED ORDER — DILTIAZEM HCL ER COATED BEADS 360 MG PO CP24: 360.0000 mg | ORAL_CAPSULE | Freq: Every day | ORAL | 3 refills | Status: DC

## 2023-10-14 MED ORDER — POTASSIUM CHLORIDE CRYS ER 10 MEQ PO TBCR: 10.0000 meq | EXTENDED_RELEASE_TABLET | Freq: Every day | ORAL | 3 refills | Status: DC

## 2023-10-14 MED ORDER — LEVOTHYROXINE SODIUM 100 MCG PO TABS: 100.0000 ug | ORAL_TABLET | Freq: Every day | ORAL | 3 refills | Status: DC

## 2023-10-14 MED ORDER — TRAMADOL HCL ER 100 MG PO TB24: 100.0000 mg | ORAL_TABLET | Freq: Every day | ORAL | 3 refills | Status: DC

## 2023-10-14 MED ORDER — HYDRALAZINE HCL 100 MG PO TABS: 100.0000 mg | ORAL_TABLET | Freq: Three times a day (TID) | ORAL | 3 refills | Status: DC

## 2023-10-14 MED ORDER — AMITRIPTYLINE HCL 100 MG PO TABS: 50.0000 mg | ORAL_TABLET | Freq: Every evening | ORAL | 1 refills | Status: AC | PRN

## 2023-10-14 NOTE — Patient Instructions (Addendum)
 Please continue all other medications as before, and refills have been done if requested.  Please have the pharmacy call with any other refills you may need.  Please continue your efforts at being more active, low cholesterol diet, and weight control.  You are otherwise up to date with prevention measures today.  Please keep your appointments with your specialists as you may have planned  You will be contacted regarding the referral for: ENT  Please make an Appointment to return in 6 months, or sooner if needed, also with Lab Appointment for testing done 3-5 days before at the FIRST FLOOR Lab (so this is for TWO appointments - please see the scheduling desk as you leave)  Good Luck in your retirement!

## 2023-10-14 NOTE — Assessment & Plan Note (Signed)
 Nbow s/p 5 yrs fosamax , DXA stalbe with mild osteopenia t score -1.6 in Oct 2024 - cont same tx

## 2023-10-14 NOTE — Progress Notes (Unsigned)
 Chief Complaint:: wellness exam and htn, snoring, low vit d, osteopenia, DM       HPI:  Penny Yates is a 68 y.o. female here for wellness exam; up to date; plans to retire July 2025                        Also c/o persistent maybe worsening snoring with sleep, asks for ENT,  Has not yet taken BP meds today.  Pt denies chest pain, increased sob or doe, wheezing, orthopnea, PND, increased LE swelling, palpitations, dizziness or syncope.   Pt denies polydipsia, polyuria, or new focal neuro s/s.    Pt denies fever, wt loss, night sweats, loss of appetite, or other constitutional symptoms  Wt up 10 lbs with worsenng diet but plans to be more active.  Last Dxa oct 2024 c/w osteopenia, has completed 5 yrs fosamax .     BP Readings from Last 3 Encounters:  10/14/23 (!) 142/86  05/10/23 (!) 142/58  03/12/23 (!) 153/83   Immunization History  Administered Date(s) Administered   Fluad Quad(high Dose 65+) 12/20/2018   Fluad Trivalent(High Dose 65+) 01/22/2023   Influenza Inj Mdck Quad Pf 01/28/2018   Influenza Split 01/08/2011, 02/12/2012   Influenza Whole 02/18/2009   Influenza, High Dose Seasonal PF 02/11/2020, 01/20/2022   Influenza,inj,Quad PF,6+ Mos 02/19/2013, 07/06/2015, 03/09/2016, 03/09/2016, 12/18/2016   Influenza-Unspecified 12/18/2018, 01/28/2021, 01/22/2022   PFIZER Comirnaty(Gray Top)Covid-19 Tri-Sucrose Vaccine 07/30/2020   PFIZER(Purple Top)SARS-COV-2 Vaccination 05/28/2019, 06/22/2019, 01/21/2020   Pfizer Covid-19 Vaccine Bivalent Booster 51yrs & up 02/07/2021   Pfizer(Comirnaty)Fall Seasonal Vaccine 12 years and older 02/02/2023   Pneumococcal Conjugate-13 07/02/2016   Pneumococcal Polysaccharide-23 10/15/2019   Rsv, Bivalent, Protein Subunit Rsvpref,pf Marlow) 02/02/2023   Td 12/22/2004   Tdap 06/26/2016   Unspecified SARS-COV-2 Vaccination 01/20/2022   Zoster Recombinant(Shingrix) 06/29/2017, 11/10/2017   There are no preventive care reminders to display for  this patient.     Past Medical History:  Diagnosis Date   Anemia    Chronic venous insufficiency    LE's   Depression    pt denies   Diabetes mellitus without complication (HCC)    no meds  pt. denies states she is pre diabetic    Fracture of shaft of right radius 01/06/2013   History of colonic polyps    multiple of succeeding years 96, 97, 98, and 99   Hyperlipidemia    Hypertension    Hyperthyroidism    s/p rad Iodine -[place]   Hypothyroidism 01/19/2015   Leg length discrepancy    right leg longer   Low back pain    Lymphedema    left arm, both legs; primary lymphedema   Lymphedema    legs   Neuromuscular disorder (HCC)    sciatiaca   Osteoporosis    Sacroiliitis (HCC)    history of   Past Surgical History:  Procedure Laterality Date   [place]   EYE SURGERY     both eyes-muscle repair  cataract right eye   OPEN REDUCTION INTERNAL FIXATION (ORIF) DISTAL RADIAL FRACTURE Right 01/06/2013   Procedure: RIGHT OPEN REDUCTION INTERNAL FIXATION (ORIF) DISTAL ARTICULATING RADIAL FRACTURE ;  Surgeon: Fonda SHAUNNA Olmsted, MD;  Location: [facility];  Service: Orthopedics;  Laterality: Right;   REPLACEMENT TOTAL KNEE  june 2007   right   REVISION OF SCAR TISSUE RECTUS MUSCLE  11/08   right knee   TOTAL KNEE  REVISION Right 04/13/2020   Procedure: Right knee polyethylene revision.;  Surgeon: Melodi Lerner, MD;  Location: [facility];  Service: Orthopedics;  Laterality: Right;    TUBAL LIGATION      reports that she has never smoked. She has never used smokeless tobacco. She reports that she does not drink alcohol and does not use drugs. family history includes Cancer in her mother; Colon cancer (age of onset: 81) in her mother; Diabetes in her mother and another family member; Heart disease in her maternal grandmother; Kidney disease in an other family member; Stroke in an other family member. Allergies  Allergen Reactions   Ace Inhibitors Cough    Levaquin  [Levofloxacin ] Rash   Lisinopril  Other (See Comments)    Cough   Current Outpatient Medications on File Prior to Visit  Medication Sig Dispense Refill   ALPHAGAN  P 0.1 % SOLN Place 1 drop into both eyes in the morning and at bedtime.   11   amoxicillin  (AMOXIL ) 500 MG capsule TAKE 2 CAPSULES BY MOUTH 1 HOUR PRIOR TO DENTAL PROCEDURE, THEN TAKE 2 CAPSULES 4 HOURS AFTER 4 capsule 2   ascorbic acid (VITAMIN C) 500 MG tablet Take 500 mg by mouth daily.     azaTHIOprine (IMURAN) 50 MG tablet Take by mouth.     Biotin 5000 MCG CAPS Take 5,000 mcg by mouth daily.     cetirizine  (ZYRTEC  ALLERGY) 10 MG tablet Take 0.5 tablets (5 mg total) by mouth daily. 30 tablet 2   cholecalciferol (VITAMIN D3) 25 MCG (1000 UNIT) tablet Take 1,000 Units by mouth daily.     diclofenac  Sodium (VOLTAREN ) 1 % GEL Apply 2 g topically 4 (four) times daily as needed (pain).      dorzolamide -timolol  (COSOPT ) 22.3-6.8 MG/ML ophthalmic solution Place 1 drop into both eyes 2 (two) times daily.      fish oil-omega-3 fatty acids 1000 MG capsule Take 1 g by mouth daily.     latanoprost (XALATAN) 0.005 % ophthalmic solution Place 1 drop into both eyes at bedtime.  4   Multiple Vitamin (MULTIVITAMIN) capsule Take 1 capsule by mouth daily.     Probiotic Product (PHILLIPS COLON HEALTH PO) Take 1 capsule by mouth daily.      valACYclovir (VALTREX) 1000 MG tablet Take 1,000 mg by mouth.     No current facility-administered medications on file prior to visit.        ROS:  All others reviewed and negative.  Objective        PE:  BP (!) 142/86 (BP Location: Right Arm, Patient Position: Sitting, Cuff Size: Normal)   Pulse 65   Temp 98.7 F (37.1 C) (Oral)   Ht 5' 8 (1.727 m)   Wt 176 lb (79.8 kg)   SpO2 98%   BMI 26.76 kg/m                 Constitutional: Pt appears in NAD               HENT: Head: NCAT.                Right Ear: External ear normal.                 Left Ear: External ear normal.                 Eyes: . Pupils are equal, round, and reactive to light. Conjunctivae and EOM are normal  Nose: without d/c or deformity               Neck: Neck supple. Gross normal ROM               Cardiovascular: Normal rate and regular rhythm.                 Pulmonary/Chest: Effort normal and breath sounds without rales or wheezing.                Abd:  Soft, NT, ND, + BS, no organomegaly               Neurological: Pt is alert. At baseline orientation, motor grossly intact               Skin: Skin is warm. No rashes, no other new lesions, LE edema - none               Psychiatric: Pt behavior is normal without agitation   Micro: none  Cardiac tracings I have personally interpreted today:  none  Pertinent Radiological findings (summarize): none   Lab Results  Component Value Date   WBC 3.0 (L) 10/11/2023   HGB 12.1 10/11/2023   HCT 34.6 (L) 10/11/2023   PLT 269.0 10/11/2023   GLUCOSE 108 (H) 10/11/2023   CHOL 174 10/11/2023   TRIG 61.0 10/11/2023   HDL 79.20 10/11/2023   LDLDIRECT 131.2 10/27/2008   LDLCALC 83 10/11/2023   ALT 24 10/11/2023   AST 28 10/11/2023   NA 139 10/11/2023   K 4.8 10/11/2023   CL 104 10/11/2023   CREATININE 0.88 10/11/2023   BUN 15 10/11/2023   CO2 29 10/11/2023   TSH 5.42 10/11/2023   INR 1.0 04/06/2020   HGBA1C 6.2 10/11/2023   MICROALBUR 1.5 10/11/2023   Assessment/Plan:  Penny Yates is a 68 y.o. Black or African American [2] female with  has a past medical history of Anemia, Chronic venous insufficiency, Depression, Diabetes mellitus without complication (HCC), Fracture of shaft of right radius (01/06/2013), History of colonic polyps, Hyperlipidemia, Hypertension, Hyperthyroidism, Hypothyroidism (01/19/2015), Leg length discrepancy, Low back pain, Lymphedema, Lymphedema, Neuromuscular disorder (HCC), Osteoporosis, and Sacroiliitis (HCC).  Osteoporosis Nbow s/p 5 yrs fosamax , DXA stalbe with mild osteopenia t score -1.6 in Oct 2024 - cont  same tx  Encounter for well adult exam with abnormal findings Age and sex appropriate education and counseling updated with regular exercise and diet Referrals for preventative services - none needed Immunizations addressed - none needed Smoking counseling  - none needed Evidence for depression or other mood disorder - none significant Most recent labs reviewed. I have personally reviewed and have noted: 1) the patient's medical and social history 2) The patient's current medications and supplements 3) The patient's height, weight, and BMI have been recorded in the chart   Hypertension BP Readings from Last 3 Encounters:  10/14/23 (!) 142/86  05/10/23 (!) 142/58  03/12/23 (!) 153/83   Uncontrolled, but pt adamant BP ok at home and has not taken med today yet, pt to continue medical treatment card cd 360 mg every day, hydrlaaizne 100 tid, and avapro  300 qd   Hyperlipidemia Lab Results  Component Value Date   LDLCALC 83 10/11/2023   Stable, pt to continue current statin lipitor 10 mg qd   Diabetes mellitus with coincident hypertension (HCC) Lab Results  Component Value Date   HGBA1C 6.2 10/11/2023   Stable, pt to continue current medical treatment  - diet, wt  control   Vitamin D  deficiency Last vitamin D  Lab Results  Component Value Date   VD25OH 35.97 10/11/2023   Low, to start oral replacement   Snoring Etiology uhclear, for ENT referral  Followup: Return in about 6 months (around 04/14/2024).  Lynwood Rush, MD 10/15/2023 1:04 PM [facility]

## 2023-10-15 ENCOUNTER — Encounter: Payer: Self-pay | Admitting: Internal Medicine

## 2023-10-15 DIAGNOSIS — R0683 Snoring: Secondary | ICD-10-CM | POA: Insufficient documentation

## 2023-10-15 DIAGNOSIS — E559 Vitamin D deficiency, unspecified: Secondary | ICD-10-CM | POA: Insufficient documentation

## 2023-10-15 NOTE — Assessment & Plan Note (Signed)

## 2023-10-15 NOTE — Assessment & Plan Note (Signed)
 Last vitamin D  Lab Results  Component Value Date   VD25OH 35.97 10/11/2023   Low, to start oral replacement

## 2023-10-15 NOTE — Assessment & Plan Note (Signed)
 Lab Results  Component Value Date   LDLCALC 83 10/11/2023   Stable, pt to continue current statin lipitor 10 mg qd

## 2023-10-15 NOTE — Addendum Note (Signed)
 Addended by: NORLEEN LYNWOOD ORN on: 10/15/2023 01:05 PM   Modules accepted: Orders

## 2023-10-15 NOTE — Assessment & Plan Note (Signed)
 Etiology uhclear, for ENT referral

## 2023-10-15 NOTE — Assessment & Plan Note (Signed)
 Lab Results  Component Value Date   HGBA1C 6.2 10/11/2023   Stable, pt to continue current medical treatment  - diet, wt control

## 2023-10-15 NOTE — Assessment & Plan Note (Signed)
 BP Readings from Last 3 Encounters:  10/14/23 (!) 142/86  05/10/23 (!) 142/58  03/12/23 (!) 153/83   Uncontrolled, but pt adamant BP ok at home and has not taken med today yet, pt to continue medical treatment card cd 360 mg every day, hydrlaaizne 100 tid, and avapro  300 qd

## 2023-10-30 ENCOUNTER — Other Ambulatory Visit: Payer: Self-pay | Admitting: Internal Medicine

## 2023-11-18 ENCOUNTER — Other Ambulatory Visit: Payer: Self-pay | Admitting: Internal Medicine

## 2023-11-18 ENCOUNTER — Other Ambulatory Visit: Payer: Self-pay | Admitting: Gastroenterology

## 2023-11-18 DIAGNOSIS — Z860100 Personal history of colon polyps, unspecified: Secondary | ICD-10-CM

## 2023-12-29 ENCOUNTER — Other Ambulatory Visit: Payer: Self-pay | Admitting: Gastroenterology

## 2023-12-29 ENCOUNTER — Other Ambulatory Visit: Payer: Self-pay | Admitting: Internal Medicine

## 2023-12-29 DIAGNOSIS — Z860100 Personal history of colon polyps, unspecified: Secondary | ICD-10-CM

## 2024-01-02 ENCOUNTER — Institutional Professional Consult (permissible substitution) (INDEPENDENT_AMBULATORY_CARE_PROVIDER_SITE_OTHER): Admitting: Otolaryngology

## 2024-01-06 ENCOUNTER — Encounter: Payer: Self-pay | Admitting: Internal Medicine

## 2024-01-07 NOTE — Telephone Encounter (Signed)
 Needs OV as these medications are antibiotics and we dont normally prescribe antibiotic without an exam.  Thanks

## 2024-01-19 ENCOUNTER — Other Ambulatory Visit: Payer: Self-pay | Admitting: Internal Medicine

## 2024-01-21 NOTE — Telephone Encounter (Signed)
 LVM to r/s to 10/21 due to Dr. Tobie having surgery.

## 2024-02-12 ENCOUNTER — Ambulatory Visit (INDEPENDENT_AMBULATORY_CARE_PROVIDER_SITE_OTHER): Admitting: Otolaryngology

## 2024-02-12 ENCOUNTER — Encounter (INDEPENDENT_AMBULATORY_CARE_PROVIDER_SITE_OTHER): Payer: Self-pay | Admitting: Otolaryngology

## 2024-02-12 VITALS — BP 136/86 | HR 77 | Ht 68.0 in | Wt 170.0 lb

## 2024-02-12 DIAGNOSIS — G4733 Obstructive sleep apnea (adult) (pediatric): Secondary | ICD-10-CM

## 2024-02-12 DIAGNOSIS — H9190 Unspecified hearing loss, unspecified ear: Secondary | ICD-10-CM

## 2024-02-12 DIAGNOSIS — H6993 Unspecified Eustachian tube disorder, bilateral: Secondary | ICD-10-CM

## 2024-02-12 MED ORDER — AZELASTINE HCL 0.1 % NA SOLN: 2.0000 | Freq: Two times a day (BID) | NASAL | 12 refills | Status: AC

## 2024-02-12 NOTE — Progress Notes (Signed)
 Dear Dr. Norleen, Here is my assessment for our mutual patient, Jaxyn Mestas. Thank you for allowing me the opportunity to care for your patient. Please do not hesitate to contact me should you have any other questions. Sincerely, Dr. Eldora Blanch  Otolaryngology Clinic Note Referring provider: Dr. Norleen HPI:  Initial visit (01/2024): Discussed the use of AI scribe software for clinical note transcription with the patient, who gave verbal consent to proceed.  History of Present Illness BRITT THEARD is a 68 year old female who presents with ear infection and snoring.  She experiences recurrent ear infections, with the most recent occurring around June, but affected her bilaterally. Symptoms include a sensation of soreness, and muffled hearing and some popping/crackling in the ears. She did have ae URI prior to this. She used peroxide on a Q-tip for treatment and avoided antibiotics due to a previous allergic reaction. This has completely There was and is no ear drainage, vertigo, or tinnitus. Symptoms resolved after the infection cleared this fall. Of note, she does have a history of hearing loss and her children have noted this as well. Prior hearing test was several years ago as below. No h/o allergies, does not take any allergy medications. No frequent sinus infections.  Patient denies current: deep pain in ear canal, eustachian tube symptoms such as popping, crackling, sensitive to pressure changes Patient also denies barotrauma, vestibular suppressant use, ototoxic medication use Prior ear surgery: denies   In addition, she reports that her partner has noted loud snoring like 'a herd of cattle.' She does not get great quality of sleep, but no apneic episodes. A sleep study 15 years ago showed mild sleep apnea. No dysphagia or odynophagia. No prior sleep surgeries. Has not had sleep study recently    ENT Surgery: no Personal or FHx of bleeding dz or anesthesia difficulty: no  GLP-1:  no AP/AC: no  Tobacco: no  PMHx: Osteporosis, Glaucoma, HTN, DM, Hypothyroidism, Glaucoma  Independent Review of Additional Tests or Records:  Dr. Norleen 10/14/2023: noted persistent and worsening snoring with sleep; Dx: Snoring, possible OSA; Rx: ref to ENT Dr. Corrie 12/30/2008: noted recent sleep study with AHI 5/hr, with RERAs and sleep disruption; Diagnosed with Hypersomnia -- unclear if fatigue v/s sleepiness; rec CPAP trial CBC, TSH, BMP 10/11/2023: WBC 3.0, TSH wnl, BUN/Cr 15/0.88, B12, Vit D wnl Sleep study 12/15/2008 independently interpreted: AHI 5.2, RERA 21.8, O2 nadir 92% it appears   Dr. Roark 08/01/2016: noted hearing loss, gradual, some congestion; essentially SNHL AU; Rx: observation Alm Camps (06/21/2022): ear infection b/l, diagnosed with OE 05/24/2023 on levofloxacin . Dx: OE; Rx: observation, treated PMH/Meds/All/SocHx/FamHx/ROS:   Past Medical History:  Diagnosis Date   Anemia    Chronic venous insufficiency    LE's   Depression    pt denies   Diabetes mellitus without complication (HCC)    no meds  pt. denies states she is pre diabetic    Fracture of shaft of right radius 01/06/2013   History of colonic polyps    multiple of succeeding years 96, 97, 98, and 99   Hyperlipidemia    Hypertension    Hyperthyroidism    s/p rad Iodine -ell[place]   Hypothyroidism 01/19/2015   Leg length discrepancy    right leg longer   Low back pain    Lymphedema    left arm, both legs; primary lymphedema   Lymphedema    legs   Neuromuscular disorder (HCC)    sciatiaca   Osteoporosis  Sacroiliitis    history of     Past Surgical History:  Procedure Laterality Date   [place]   EYE SURGERY     both eyes-muscle repair  cataract right eye   OPEN REDUCTION INTERNAL FIXATION (ORIF) DISTAL RADIAL FRACTURE Right 01/06/2013   Procedure: RIGHT OPEN REDUCTION INTERNAL FIXATION (ORIF) DISTAL ARTICULATING RADIAL FRACTURE ;  Surgeon: Fonda SHAUNNA Olmsted, MD;  Location:  [facility];  Service: Orthopedics;  Laterality: Right;   REPLACEMENT TOTAL KNEE  june 2007   right   REVISION OF SCAR TISSUE RECTUS MUSCLE  11/08   right knee   TOTAL KNEE REVISION Right 04/13/2020   Procedure: Right knee polyethylene revision.;  Surgeon: Melodi Lerner, MD;  Location: [facility];  Service: Orthopedics;  Laterality: Right;    TUBAL LIGATION      Family History  Problem Relation Age of Onset   Colon cancer Mother 69   Diabetes Mother    Cancer Mother        Colon Cancer   Heart disease Maternal Grandmother    Diabetes Other        mother and several others in family   Stroke Other    Kidney disease Other    Thyroid  disease Neg Hx    Esophageal cancer Neg Hx    Rectal cancer Neg Hx    Stomach cancer Neg Hx    Colon polyps Neg Hx      Social Connections: Not on file      Current Outpatient Medications:    azelastine (ASTELIN) 0.1 % nasal spray, Place 2 sprays into both nostrils 2 (two) times daily. Use in each nostril as directed, Disp: 30 mL, Rfl: 12   ALPHAGAN  P 0.1 % SOLN, Place 1 drop into both eyes in the morning and at bedtime. , Disp: , Rfl: 11   amitriptyline  (ELAVIL ) 100 MG tablet, Take 0.5-1 tablets (50-100 mg total) by mouth at bedtime as needed., Disp: 90 tablet, Rfl: 1   amoxicillin  (AMOXIL ) 500 MG capsule, TAKE 2 CAPSULES BY MOUTH 1 HOUR PRIOR TO DENTAL PROCEDURE, THEN TAKE 2 CAPSULES 4 HOURS AFTER, Disp: 4 capsule, Rfl: 2   ascorbic acid (VITAMIN C) 500 MG tablet, Take 500 mg by mouth daily., Disp: , Rfl:    atorvastatin  (LIPITOR) 10 MG tablet, Take 1 tablet (10 mg total) by mouth daily., Disp: 90 tablet, Rfl: 3   azaTHIOprine (IMURAN) 50 MG tablet, Take by mouth., Disp: , Rfl:    Biotin 5000 MCG CAPS, Take 5,000 mcg by mouth daily., Disp: , Rfl:    cetirizine  (ZYRTEC  ALLERGY) 10 MG tablet, Take 0.5 tablets (5 mg total) by mouth daily., Disp: 30 tablet, Rfl: 2   cholecalciferol (VITAMIN D3) 25 MCG (1000 UNIT) tablet, Take 1,000  Units by mouth daily., Disp: , Rfl:    diclofenac  Sodium (VOLTAREN ) 1 % GEL, Apply 2 g topically 4 (four) times daily as needed (pain). , Disp: , Rfl:    diltiazem  (CARDIZEM  CD) 360 MG 24 hr capsule, Take 1 capsule (360 mg total) by mouth daily., Disp: 90 capsule, Rfl: 3   dorzolamide -timolol  (COSOPT ) 22.3-6.8 MG/ML ophthalmic solution, Place 1 drop into both eyes 2 (two) times daily. , Disp: , Rfl:    fish oil-omega-3 fatty acids 1000 MG capsule, Take 1 g by mouth daily., Disp: , Rfl:    furosemide  (LASIX ) 40 MG tablet, Take 1 tablet (40 mg total) by mouth daily as needed. As needed, Disp: 90  tablet, Rfl: 3   guanFACINE  (TENEX ) 2 MG tablet, TAKE 1 TABLET BY MOUTH EVERYDAY AT BEDTIME, Disp: 90 tablet, Rfl: 3   hydrALAZINE  (APRESOLINE ) 100 MG tablet, Take 1 tablet (100 mg total) by mouth 3 (three) times daily., Disp: 270 tablet, Rfl: 3   irbesartan  (AVAPRO ) 300 MG tablet, Take 1 tablet (300 mg total) by mouth daily., Disp: 90 tablet, Rfl: 3   latanoprost (XALATAN) 0.005 % ophthalmic solution, Place 1 drop into both eyes at bedtime., Disp: , Rfl: 4   levothyroxine  (SYNTHROID ) 100 MCG tablet, TAKE 1 TABLET BY MOUTH EVERY DAY, Disp: 90 tablet, Rfl: 3   Multiple Vitamin (MULTIVITAMIN) capsule, Take 1 capsule by mouth daily., Disp: , Rfl:    potassium chloride  (KLOR-CON  M10) 10 MEQ tablet, Take 1 tablet (10 mEq total) by mouth daily., Disp: 90 tablet, Rfl: 3   Probiotic Product (PHILLIPS COLON HEALTH PO), Take 1 capsule by mouth daily. , Disp: , Rfl:    traMADol  (ULTRAM -ER) 100 MG 24 hr tablet, TAKE 1 TABLET BY MOUTH EVERY DAY, Disp: 30 tablet, Rfl: 3   valACYclovir (VALTREX) 1000 MG tablet, Take 1,000 mg by mouth., Disp: , Rfl:    Physical Exam:   BP 136/86   Pulse 77   Ht 5' 8 (1.727 m)   Wt 170 lb (77.1 kg)   SpO2 95%   BMI 25.85 kg/m   Salient findings:  CN II-XII intact Bilateral EAC clear and TM intact with well pneumatized middle ear spaces Weber 512: mid Rinne 512: AC > BC b/l   Anterior rhinoscopy: Septum relatively midline; bilateral inferior turbinates with fairly significant hypertrophy No lesions of oral cavity/oropharynx No obviously palpable neck masses/lymphadenopathy/thyromegaly No respiratory distress or stridor; voice quality class   Seprately Identifiable Procedures:  Prior to initiating any procedures, risks/benefits/alternatives were explained to the patient and verbal consent obtained. None Impression & Plans:  Cheyne Bungert is a 68 y.o. female with:  1. Subjective hearing loss   2. OSA (obstructive sleep apnea)   3. Dysfunction of both eustachian tubes    RAOM v/s ETD -- based on sx, appears to be more ETD related. Does not have h/o frequent ear infections. 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 As such, will get audio  Snoring with suspected sleep apnea Loud snoring and unrefreshed sleep suggest sleep apnea - Referred to sleep clinic for sleep study.  F/u 3 months with PA  See below regarding exact medications prescribed this encounter including dosages and route: Meds ordered this encounter  Medications   azelastine (ASTELIN) 0.1 % nasal spray    Sig: Place 2 sprays into both nostrils 2 (two) times daily. Use in each nostril as directed    Dispense:  30 mL    Refill:  12      Thank you for allowing me the opportunity to care for your patient. Please do not hesitate to contact me should you have any other questions.  Sincerely, Eldora Blanch, MD Otolaryngologist (ENT), [facility] Phone: [phone] Fax: [phone]  02/12/2024, 8:51 AM   MDM:  Level 4 -  [phone] Complexity/Problems addressed: mod - chronic problems, multiple  Data complexity: mod - independent interpretation of sleep study; review of note, labs, ordering test - Morbidity: mod  - Drug prescribed or managed: y

## 2024-02-12 NOTE — Patient Instructions (Addendum)
 When you get a cold, use astelin nasal spray two sprays each nostril twice per day for 2 weeks

## 2024-02-16 ENCOUNTER — Other Ambulatory Visit: Payer: Self-pay | Admitting: Internal Medicine

## 2024-02-27 ENCOUNTER — Telehealth (INDEPENDENT_AMBULATORY_CARE_PROVIDER_SITE_OTHER): Payer: Self-pay | Admitting: Physician Assistant

## 2024-02-27 NOTE — Telephone Encounter (Signed)
 LVM for patient to call back regarding Penny Yates 03/23/2024 appt - need to move to 2:45

## 2024-03-03 ENCOUNTER — Encounter: Payer: Self-pay | Admitting: Internal Medicine

## 2024-03-03 MED ORDER — TRAMADOL HCL ER 100 MG PO TB24: 100.0000 mg | ORAL_TABLET | Freq: Every day | ORAL | 3 refills | Status: AC

## 2024-03-13 ENCOUNTER — Encounter (INDEPENDENT_AMBULATORY_CARE_PROVIDER_SITE_OTHER): Payer: Self-pay

## 2024-03-13 ENCOUNTER — Telehealth (INDEPENDENT_AMBULATORY_CARE_PROVIDER_SITE_OTHER): Payer: Self-pay | Admitting: Physician Assistant

## 2024-03-17 ENCOUNTER — Encounter (HOSPITAL_BASED_OUTPATIENT_CLINIC_OR_DEPARTMENT_OTHER): Payer: Self-pay

## 2024-03-17 ENCOUNTER — Ambulatory Visit (INDEPENDENT_AMBULATORY_CARE_PROVIDER_SITE_OTHER)

## 2024-03-17 VITALS — BP 145/83 | HR 71 | Ht 68.0 in | Wt 175.0 lb

## 2024-03-17 DIAGNOSIS — I890 Lymphedema, not elsewhere classified: Secondary | ICD-10-CM

## 2024-03-17 DIAGNOSIS — G4710 Hypersomnia, unspecified: Secondary | ICD-10-CM | POA: Diagnosis not present

## 2024-03-17 DIAGNOSIS — R0683 Snoring: Secondary | ICD-10-CM

## 2024-03-17 DIAGNOSIS — G4719 Other hypersomnia: Secondary | ICD-10-CM

## 2024-03-17 NOTE — Progress Notes (Signed)
 Epworth Sleepiness Scale  Use the following scale to choose the most appropriate number for each situation. 0 Would never nod off 1  Slight  chance of nodding off 2 Moderate chance of nodding off 3 High chance of nodding off  Sitting and reading: 0 Watching TV: 1 Sitting, inactive, in a public place (e.g., in a meeting, theater, or dinner event): 0 As a passenger in a car for an hour or more without stopping for a break: 3 Lying down to rest when circumstances permit:3 Sitting and talking to someone: 0 Sitting quietly after a meal without alcohol: 0 In a car, while stopped for a few  minutes in traffic or at a light: 0  TOTOAL: 7

## 2024-03-17 NOTE — Patient Instructions (Signed)
 Complete home sleep test as ordered.  Follow sleep hygiene as discussed.  Follow up in 6-8 weeks to discuss test results.

## 2024-03-17 NOTE — Telephone Encounter (Signed)
 Good morning!  Please reach out to our office so we can get the patient's upcoming appointment rescheduled.  Dr Tobie will not be in the office on 03/23/2024.  Thank you

## 2024-03-17 NOTE — Progress Notes (Signed)
 @Patient  ID: Penny Yates, female    DOB: 08-23-1955, 68 y.o.   MRN: [id number]  Chief Complaint  Patient presents with   Establish Care    New sleep     Referring provider: Tobie Eldora NOVAK, MD  HPI: Discussed the use of AI scribe software for clinical note transcription with the patient, who gave verbal consent to proceed.  History of Present Illness Penny Yates is a 68 year old female who presents with sleep disturbances. She was referred by Dr. Tobie for evaluation of her sleep disturbances.  She experiences significant snoring, as noted by her husband, although he is unable to observe any apneic episodes due to his own CPAP use. She goes to bed between 10 and 11 PM, falls asleep within 15 minutes, and wakes up around 9 AM. She wakes up two to three times per night to urinate, which she attributes to taking her diuretic at night. No waking up short of breath or feeling suffocated. Her weight has increased by 8 to 10 pounds over the last two years. A sleep study conducted in 2010 showed an AHI of 5.2, and she was told she did not have sleep apnea.  She has a history of hypertension, high cholesterol, and hypothyroidism. She takes levothyroxine  100 mcg daily, which was increased from 75 mcg due to persistent symptoms. She initially had hyperthyroidism treated with radioactive iodine  due to nodules, which led to hypothyroidism. She never feels hungry and often experiences food regurgitation unless she eats light meals.  She experiences palpitations with exertion, which resolve with rest. She takes diltiazem  for blood pressure management. There is a family history of atrial fibrillation, but she has not been diagnosed with it.  She experiences lymphedema and uses a diuretic as needed. She finds it difficult to access lymphedema therapy locally and has been managing with home equipment, alternating compression on her legs. Compression must be full-length from toes to waist due to  swelling.  She retired from [employer] and is less active than before, contributing to her weight gain. She walks her dog around her property twice a day, covering 1.7 acres each time.   TEST/EVENTS : 12/15/2008:  AHI 5.2/hr  Allergies  Allergen Reactions   Ace Inhibitors Cough   Levaquin  [Levofloxacin ] Rash   Lisinopril  Other (See Comments)    Cough    Immunization History  Administered Date(s) Administered    sv, Bivalent, Protein Subunit Rsvpref,pf (Abrysvo) 02/02/2023   Fluad Quad(high Dose 65+) 12/20/2018   Fluad Trivalent(High Dose 65+) 01/22/2023   INFLUENZA, HIGH DOSE SEASONAL PF 02/11/2020, 01/20/2022   Influenza Inj Mdck Quad Pf 01/28/2018   Influenza Split 01/08/2011, 02/12/2012   Influenza Whole 02/18/2009   Influenza,inj,Quad PF,6+ Mos 02/19/2013, 07/06/2015, 03/09/2016, 03/09/2016, 12/18/2016   Influenza-Unspecified 12/18/2018, 01/28/2021, 01/22/2022   PFIZER Comirnaty(Gray Top)Covid-19 Tri-Sucrose Vaccine 07/30/2020   PFIZER(Purple Top)SARS-COV-2 Vaccination 05/28/2019, 06/22/2019, 01/21/2020   Pfizer Covid-19 Vaccine Bivalent Booster 9yrs & up 02/07/2021   Pfizer(Comirnaty)Fall Seasonal Vaccine 12 years and older 01/28/2024   Pneumococcal Conjugate-13 07/02/2016   Pneumococcal Polysaccharide-23 10/15/2019   Td 12/22/2004   Tdap 06/26/2016   Unspecified SARS-COV-2 Vaccination 01/20/2022   Zoster Recombinant(Shingrix) 06/29/2017, 11/10/2017    Past Medical History:  Diagnosis Date   Anemia    Chronic venous insufficiency    LE's   Depression    pt denies   Diabetes mellitus without complication (HCC)    no meds  pt. denies states she is pre diabetic    Fracture  of shaft of right radius 01/06/2013   History of colonic polyps    multiple of succeeding years 96, 97, 98, and 99   Hyperlipidemia    Hypertension    Hyperthyroidism    s/p rad Iodine -[facility]   Hypothyroidism 01/19/2015   Leg length discrepancy    right leg longer   Low back pain    Lymphedema     left arm, both legs; primary lymphedema   Lymphedema    legs   Neuromuscular disorder (HCC)    sciatiaca   Osteoporosis    Sacroiliitis    history of    Tobacco History: Social History   Tobacco Use  Smoking Status Never  Smokeless Tobacco Never   Counseling given: Not Answered   Outpatient Medications Prior to Visit  Medication Sig Dispense Refill   ALPHAGAN  P 0.1 % SOLN Place 1 drop into both eyes in the morning and at bedtime.   11   amitriptyline  (ELAVIL ) 100 MG tablet Take 0.5-1 tablets (50-100 mg total) by mouth at bedtime as needed. 90 tablet 1   amoxicillin  (AMOXIL ) 500 MG capsule TAKE 2 CAPSULES BY MOUTH 1 HOUR PRIOR TO DENTAL PROCEDURE, THEN TAKE 2 CAPSULES 4 HOURS AFTER 4 capsule 2   ascorbic acid (VITAMIN C) 500 MG tablet Take 500 mg by mouth daily.     atorvastatin  (LIPITOR) 10 MG tablet Take 1 tablet (10 mg total) by mouth daily. 90 tablet 3   azaTHIOprine (IMURAN) 50 MG tablet Take by mouth.     azelastine  (ASTELIN ) 0.1 % nasal spray Place 2 sprays into both nostrils 2 (two) times daily. Use in each nostril as directed 30 mL 12   Biotin 5000 MCG CAPS Take 5,000 mcg by mouth daily.     cetirizine  (ZYRTEC  ALLERGY) 10 MG tablet Take 0.5 tablets (5 mg total) by mouth daily. 30 tablet 2   cholecalciferol (VITAMIN D3) 25 MCG (1000 UNIT) tablet Take 1,000 Units by mouth daily.     diclofenac  Sodium (VOLTAREN ) 1 % GEL Apply 2 g topically 4 (four) times daily as needed (pain).      diltiazem  (CARDIZEM  CD) 360 MG 24 hr capsule Take 1 capsule (360 mg total) by mouth daily. 90 capsule 3   dorzolamide -timolol  (COSOPT ) 22.3-6.8 MG/ML ophthalmic solution Place 1 drop into both eyes 2 (two) times daily.      fish oil-omega-3 fatty acids 1000 MG capsule Take 1 g by mouth daily.     furosemide  (LASIX ) 40 MG tablet Take 1 tablet (40 mg total) by mouth daily as needed. As needed 90 tablet 3   guanFACINE  (TENEX ) 2 MG tablet TAKE 1 TABLET BY MOUTH EVERYDAY AT BEDTIME 90 tablet 3    hydrALAZINE  (APRESOLINE ) 100 MG tablet Take 1 tablet (100 mg total) by mouth 3 (three) times daily. 270 tablet 3   irbesartan  (AVAPRO ) 300 MG tablet Take 1 tablet (300 mg total) by mouth daily. 90 tablet 3   latanoprost (XALATAN) 0.005 % ophthalmic solution Place 1 drop into both eyes at bedtime.  4   levothyroxine  (SYNTHROID ) 100 MCG tablet TAKE 1 TABLET BY MOUTH EVERY DAY 90 tablet 3   Multiple Vitamin (MULTIVITAMIN) capsule Take 1 capsule by mouth daily.     potassium chloride  (KLOR-CON  M10) 10 MEQ tablet Take 1 tablet (10 mEq total) by mouth daily. 90 tablet 3   Probiotic Product (PHILLIPS COLON HEALTH PO) Take 1 capsule by mouth daily.      traMADol  (ULTRAM -ER) 100 MG 24 hr tablet  Take 1 tablet (100 mg total) by mouth daily. 30 tablet 3   valACYclovir (VALTREX) 1000 MG tablet Take 1,000 mg by mouth.     No facility-administered medications prior to visit.     Review of Systems: as per hpi  Constitutional:   No  weight loss, night sweats,  Fevers, chills, fatigue, or  lassitude.  HEENT:   No headaches,  Difficulty swallowing,  Tooth/dental problems, or  Sore throat,                No sneezing, itching, ear ache, nasal congestion, post nasal drip,   CV:  No chest pain,  Orthopnea, PND, swelling in lower extremities, anasarca, dizziness, palpitations, syncope.   GI  No heartburn, indigestion, abdominal pain, nausea, vomiting, diarrhea, change in bowel habits, loss of appetite, bloody stools.   Resp: No shortness of breath with exertion or at rest.  No excess mucus, no productive cough,  No non-productive cough,  No coughing up of blood.  No change in color of mucus.  No wheezing.  No chest wall deformity  Skin: no rash or lesions.  GU: no dysuria, change in color of urine, no urgency or frequency.  No flank pain, no hematuria   MS:  No joint pain or swelling.  No decreased range of motion.  No back pain.    Physical Exam  BP (!) 145/83   Pulse 71   Ht 5' 8 (1.727 m)   Wt  175 lb (79.4 kg)   SpO2 97%   BMI 26.61 kg/m   GEN: A/Ox3; pleasant , NAD, well nourished    HEENT:  [place]/AT,  EACs-clear, TMs-wnl, NOSE-clear, THROAT-clear, no lesions, no postnasal drip or exudate noted. Mallampati 3  NECK:  Supple w/ fair ROM; no JVD; normal carotid impulses w/o bruits; no thyromegaly or nodules palpated; no lymphadenopathy.    RESP  Clear  P & A; w/o, wheezes/ rales/ or rhonchi. no accessory muscle use, no dullness to percussion  CARD:  RRR, no m/r/g, lymphedema present bilaterally, pulses intact, no cyanosis or clubbing.  GI:   Soft & nt; nml bowel sounds; no organomegaly or masses detected.   Musco: Warm bil, no deformities or joint swelling noted.   Neuro: alert, no focal deficits noted.    Skin: Warm, no lesions or rashes    Lab Results:  CBC    Component Value Date/Time   WBC 3.0 (L) 10/11/2023 1419   RBC 3.29 (L) 10/11/2023 1419   HGB 12.1 10/11/2023 1419   HCT 34.6 (L) 10/11/2023 1419   PLT 269.0 10/11/2023 1419   MCV 105.2 (H) 10/11/2023 1419   MCH 29.4 04/14/2020 0314   MCHC 35.0 10/11/2023 1419   RDW 16.6 (H) 10/11/2023 1419   LYMPHSABS 1.1 10/11/2023 1419   MONOABS 0.3 10/11/2023 1419   EOSABS 0.1 10/11/2023 1419   BASOSABS 0.1 10/11/2023 1419    BMET    Component Value Date/Time   NA 139 10/11/2023 1419   K 4.8 10/11/2023 1419   CL 104 10/11/2023 1419   CO2 29 10/11/2023 1419   GLUCOSE 108 (H) 10/11/2023 1419   BUN 15 10/11/2023 1419   CREATININE 0.88 10/11/2023 1419   CALCIUM  9.7 10/11/2023 1419   GFRNONAA >60 04/14/2020 0314   GFRAA >60 10/09/2017 1201    BNP No results found for: BNP  ProBNP    Component Value Date/Time   PROBNP 45.3 07/26/2010 1151    Imaging: No results found.  Administration History  None           No data to display          No results found for: NITRICOXIDE   Assessment & Plan:   Assessment & Plan Excessive daytime sleepiness  Assessment and Plan Assessment &  Plan Snoring and hypersomnia  Chronic snoring with hypersomnia. Previous sleep study showed borderline normal AHI.  - Ordered home sleep study to reevaluate for sleep-disordered breathing.  Lymphedema Noted chronic lymphedema with leg swelling.   Return in about 7 weeks (around 05/05/2024) for sleep study review.  Candis Dandy, PA-C 03/17/2024

## 2024-03-23 ENCOUNTER — Ambulatory Visit (INDEPENDENT_AMBULATORY_CARE_PROVIDER_SITE_OTHER): Admitting: Physician Assistant

## 2024-03-23 ENCOUNTER — Ambulatory Visit (INDEPENDENT_AMBULATORY_CARE_PROVIDER_SITE_OTHER): Admitting: Audiology

## 2024-03-23 DIAGNOSIS — H9041 Sensorineural hearing loss, unilateral, right ear, with unrestricted hearing on the contralateral side: Secondary | ICD-10-CM | POA: Diagnosis not present

## 2024-03-23 NOTE — Progress Notes (Signed)
  [address], [address] [place], [place] [address] [phone]  Audiological Evaluation    Name: Penny Yates     DOB:   [birth year]      MRN:   [id number]                                                                                     Service Date: 03/23/2024     Accompanied by: self   Patient comes today after [name], PA-C sent a referral for a hearing evaluation due to concerns with recurrent ear infections.   Symptoms Yes Details  Hearing loss  [x]  Family complaints of TV being too loud  Tinnitus  [x]  While having ear infections only and then went away  Ear pain/ infections/pressure  [x]  Twice within 1 year  Balance problems  []    Noise exposure history  []    Previous ear surgeries  []    Family history of hearing loss  []    Amplification  []    Other  []      Otoscopy: Right ear: Clear external ear canal and notable landmarks visualized on the tympanic membrane. Left ear:  Clear external ear canal and notable landmarks visualized on the tympanic membrane.  Tympanometry: Right ear: Normal external ear canal volume with normal middle ear pressure and tympanic membrane compliance (Type A). Findings are suggestive of normal middle ear function. Left ear: Normal external ear canal volume with normal middle ear pressure and tympanic membrane compliance (Type A). Findings are suggestive of normal middle ear function.   Hearing Evaluation The hearing test results were completed under headphones and results are deemed to be of good reliability. Test technique:  conventional    Pure tone Audiometry: Right ear- Normal hearing from [phone] Hz, mild sensorineural hearing loss at 8000 Hz.   Left ear-  Normal hearing from 250 Hz - 8000 Hz.  Speech Audiometry: Right ear- Speech Reception Threshold (SRT) was obtained at 10 dBHL. Left ear-Speech Reception Threshold (SRT) was obtained at 10 dBHL.   Word Recognition Score Tested using NU-6 (recorded) Right ear: 100%  was obtained at a presentation level of 65 dBHL with contralateral masking which is deemed as  excellent. Left ear: 100% was obtained at a presentation level of 65 dBHL with contralateral masking which is deemed as  excellent.   Impression: There is a significant difference in pure-tone thresholds between ears, worse in the right ear at 8000 Hz.   Recommendations: Follow up with ENT as scheduled. Return for a hearing evaluation if concerns with hearing changes arise or per MD recommendation.   Shermaine Brigham MARIE LEROUX-MARTINEZ, AUD

## 2024-03-24 ENCOUNTER — Ambulatory Visit (INDEPENDENT_AMBULATORY_CARE_PROVIDER_SITE_OTHER): Admitting: Physician Assistant

## 2024-04-05 ENCOUNTER — Encounter

## 2024-04-05 DIAGNOSIS — G4719 Other hypersomnia: Secondary | ICD-10-CM

## 2024-04-08 ENCOUNTER — Ambulatory Visit (INDEPENDENT_AMBULATORY_CARE_PROVIDER_SITE_OTHER): Admitting: Physician Assistant

## 2024-04-08 ENCOUNTER — Telehealth (INDEPENDENT_AMBULATORY_CARE_PROVIDER_SITE_OTHER): Payer: Self-pay | Admitting: Physician Assistant

## 2024-04-08 ENCOUNTER — Encounter (INDEPENDENT_AMBULATORY_CARE_PROVIDER_SITE_OTHER): Payer: Self-pay | Admitting: Physician Assistant

## 2024-04-08 VITALS — BP 135/80 | HR 66

## 2024-04-08 DIAGNOSIS — J343 Hypertrophy of nasal turbinates: Secondary | ICD-10-CM | POA: Diagnosis not present

## 2024-04-08 DIAGNOSIS — H918X3 Other specified hearing loss, bilateral: Secondary | ICD-10-CM

## 2024-04-08 DIAGNOSIS — Z9981 Dependence on supplemental oxygen: Secondary | ICD-10-CM | POA: Diagnosis not present

## 2024-04-08 DIAGNOSIS — H6092 Unspecified otitis externa, left ear: Secondary | ICD-10-CM

## 2024-04-08 DIAGNOSIS — H608X9 Other otitis externa, unspecified ear: Secondary | ICD-10-CM | POA: Diagnosis not present

## 2024-04-08 DIAGNOSIS — H903 Sensorineural hearing loss, bilateral: Secondary | ICD-10-CM

## 2024-04-08 NOTE — Telephone Encounter (Signed)
 Sorry I meant the MRI thanks

## 2024-04-08 NOTE — Patient Instructions (Signed)
 I have ordered an imaging study for you to complete prior to your next visit. Please call [facility] Scheduling at [phone] to schedule your imaging if you have not received a call within 24 hours. If you are unable to complete your imaging study prior to your next scheduled visit please call our office to let us  know.

## 2024-04-08 NOTE — Telephone Encounter (Signed)
 At checkout, the note said that you need a phone visit after the audio, but the patient has already had the audio, I wanted to check and see if she needs a phone follow up after the imaging?  Please advise.

## 2024-04-09 NOTE — Progress Notes (Signed)
 Dear Dr. Norleen, Here is my assessment for our mutual patient, Penny Yates. Thank you for allowing me the opportunity to care for your patient. Please do not hesitate to contact me should you have any other questions. Sincerely, Chyrl Cohen PA-C  Otolaryngology Clinic Note Referring provider: Dr. Norleen HPI:  Penny Yates is a 68 y.o. female kindly referred by Dr. Norleen   Discussed the use of AI scribe software for clinical note transcription with the patient, who gave verbal consent to proceed.  History of Present Illness    Penny Yates is a 68 year old female who presents with snoring and recurrent ear infections. She was referred by Dr. Tobie for evaluation of her snoring and ear problems.  She was last seen in the office by Dr. Tobie on 02/12/2024.  Below is a recap of encounter.  Penny Yates is a 68 year old female who presents with ear infection and snoring.   She experiences recurrent ear infections, with the most recent occurring around June, but affected her bilaterally. Symptoms include a sensation of soreness, and muffled hearing and some popping/crackling in the ears. She did have ae URI prior to this. She used peroxide on a Q-tip for treatment and avoided antibiotics due to a previous allergic reaction. This has completely There was and is no ear drainage, vertigo, or tinnitus. Symptoms resolved after the infection cleared this fall. Of note, she does have a history of hearing loss and her children have noted this as well. Prior hearing test was several years ago as below. No h/o allergies, does not take any allergy medications. No frequent sinus infections.  Patient denies current: deep pain in ear canal, eustachian tube symptoms such as popping, crackling, sensitive to pressure changes Patient also denies barotrauma, vestibular suppressant use, ototoxic medication use Prior ear surgery: denies     In addition, she reports that her partner has noted loud snoring like  'a herd of cattle.' She does not get great quality of sleep, but no apneic episodes. A sleep study 15 years ago showed mild sleep apnea. No dysphagia or odynophagia. No prior sleep surgeries. Has not had sleep study recently       ENT Surgery: no Personal or FHx of bleeding dz or anesthesia difficulty: no   GLP-1: no AP/AC: no   Tobacco: no   PMHx: Osteporosis, Glaucoma, HTN, DM, Hypothyroidism, Glaucoma  Update 04/08/2024  She experiences snoring and has recently completed a home sleep study; results not yet available.  He has been seen by sleep study specialist and will be following up with them.   She has had two ear infections within the same year, approximately six months apart. The first incident occurred when she did not use ear plugs while washing her hair, which she suspects may have contributed to the infection. The second infection appeared without a clear cause. She does not use Q-tips and has not had infections elsewhere in her body. The last ear infection was in September, and by the time she got an appointment, her ears had improved. She was initially treated with oral Levaquin , which caused a rash, and subsequently used ear drops she had from the first infection for the second infection. She is unsure if the infections were inner or outer ear, but recalls difficulty with ear drops entering her ear canal.  Her hearing has been a concern, as her family comments on the loud volume of her television. She feels her hearing is adequate but has noticed  a difference, particularly in her left ear. She recalls a 10% hearing loss in her left ear following an ear infection during pregnancy over 40 years ago.  She does not experience heartburn and is not diabetic.          Independent Review of Additional Tests or Records:  Previous office visit 02/12/2024   PMH/Meds/All/SocHx/FamHx/ROS:   Past Medical History:  Diagnosis Date   Anemia    Chronic venous insufficiency    LE's    Depression    pt denies   Diabetes mellitus without complication (HCC)    no meds  pt. denies states she is pre diabetic    Fracture of shaft of right radius 01/06/2013   History of colonic polyps    multiple of succeeding years 96, 97, 98, and 99   Hyperlipidemia    Hypertension    Hyperthyroidism    s/p rad Iodine -[facility]   Hypothyroidism 01/19/2015   Leg length discrepancy    right leg longer   Low back pain    Lymphedema    left arm, both legs; primary lymphedema   Lymphedema    legs   Neuromuscular disorder (HCC)    sciatiaca   Osteoporosis    Sacroiliitis    history of     Past Surgical History:  Procedure Laterality Date   [place]  [place]   EYE SURGERY     both eyes-muscle repair  cataract right eye   OPEN REDUCTION INTERNAL FIXATION (ORIF) DISTAL RADIAL FRACTURE Right 01/06/2013   Procedure: RIGHT OPEN REDUCTION INTERNAL FIXATION (ORIF) DISTAL ARTICULATING RADIAL FRACTURE ;  Surgeon: Fonda SHAUNNA Olmsted, MD;  Location: [facility];  Service: Orthopedics;  Laterality: Right;   REPLACEMENT TOTAL KNEE  june 2007   right   REVISION OF SCAR TISSUE RECTUS MUSCLE  11/08   right knee   TOTAL KNEE REVISION Right 04/13/2020   Procedure: Right knee polyethylene revision.;  Surgeon: Melodi Lerner, MD;  Location: [facility];  Service: Orthopedics;  Laterality: Right;    TUBAL LIGATION      Family History  Problem Relation Age of Onset   Colon cancer Mother 80   Diabetes Mother    Cancer Mother        Colon Cancer   Heart disease Maternal Grandmother    Diabetes Other        mother and several others in family   Stroke Other    Kidney disease Other    Thyroid  disease Neg Hx    Esophageal cancer Neg Hx    Rectal cancer Neg Hx    Stomach cancer Neg Hx    Colon polyps Neg Hx      Social Connections: Not on file     Current Medications[1]   Physical Exam:   BP 135/80   Pulse 66   SpO2 97%   Pertinent Findings  CN II-XII grossly  intact Bilateral EAC clear and TM intact with well pneumatized middle ear spaces Anterior rhinoscopy: Septum midline; bilateral inferior turbinates with hypertrophy No lesions of oral cavity/oropharynx No obviously palpable neck masses/lymphadenopathy/thyromegaly No respiratory distress or stridor        Seprately Identifiable Procedures:  None  Impression & Plans:  Piccola Arico is a 68 y.o. female with the following   Assessment and Plan    Asymmetrical sensorineural hearing loss . Normal word recognition - Ordered MRI of the ears. - Scheduled telephone encounter to discuss MRI results.  Recurrent otitis externa Two episodes  in six months. . Previous Levaquin  caused rash; ear drops effective. - Advised to return if infections recur for evaluation and possible cleaning.  Nasal turbinate hypertrophy Swelling likely due to CPAP use. - Recommended saline irrigation, Flonase, and antihistamine if congestion occurs.           - f/u phone visit after completion of MRI   Thank you for allowing me the opportunity to care for your patient. Please do not hesitate to contact me should you have any other questions.  Sincerely, Chyrl Cohen PA-C [facility] Phone: [phone] Fax: [phone]  04/09/2024, 12:46 PM         [1]  Current Outpatient Medications:    ALPHAGAN  P 0.1 % SOLN, Place 1 drop into both eyes in the morning and at bedtime. , Disp: , Rfl: 11   amitriptyline  (ELAVIL ) 100 MG tablet, Take 0.5-1 tablets (50-100 mg total) by mouth at bedtime as needed., Disp: 90 tablet, Rfl: 1   amoxicillin  (AMOXIL ) 500 MG capsule, TAKE 2 CAPSULES BY MOUTH 1 HOUR PRIOR TO DENTAL PROCEDURE, THEN TAKE 2 CAPSULES 4 HOURS AFTER, Disp: 4 capsule, Rfl: 2   ascorbic acid (VITAMIN C) 500 MG tablet, Take 500 mg by mouth daily., Disp: , Rfl:    atorvastatin  (LIPITOR) 10 MG tablet, Take 1 tablet (10 mg total) by mouth daily., Disp: 90 tablet, Rfl: 3   azaTHIOprine  (IMURAN) 50 MG tablet, Take by mouth., Disp: , Rfl:    azelastine  (ASTELIN ) 0.1 % nasal spray, Place 2 sprays into both nostrils 2 (two) times daily. Use in each nostril as directed, Disp: 30 mL, Rfl: 12   Biotin 5000 MCG CAPS, Take 5,000 mcg by mouth daily., Disp: , Rfl:    cetirizine  (ZYRTEC  ALLERGY) 10 MG tablet, Take 0.5 tablets (5 mg total) by mouth daily., Disp: 30 tablet, Rfl: 2   cholecalciferol (VITAMIN D3) 25 MCG (1000 UNIT) tablet, Take 1,000 Units by mouth daily., Disp: , Rfl:    diclofenac  Sodium (VOLTAREN ) 1 % GEL, Apply 2 g topically 4 (four) times daily as needed (pain). , Disp: , Rfl:    diltiazem  (CARDIZEM  CD) 360 MG 24 hr capsule, Take 1 capsule (360 mg total) by mouth daily., Disp: 90 capsule, Rfl: 3   dorzolamide -timolol  (COSOPT ) 22.3-6.8 MG/ML ophthalmic solution, Place 1 drop into both eyes 2 (two) times daily. , Disp: , Rfl:    fish oil-omega-3 fatty acids 1000 MG capsule, Take 1 g by mouth daily., Disp: , Rfl:    furosemide  (LASIX ) 40 MG tablet, Take 1 tablet (40 mg total) by mouth daily as needed. As needed, Disp: 90 tablet, Rfl: 3   guanFACINE  (TENEX ) 2 MG tablet, TAKE 1 TABLET BY MOUTH EVERYDAY AT BEDTIME, Disp: 90 tablet, Rfl: 3   hydrALAZINE  (APRESOLINE ) 100 MG tablet, Take 1 tablet (100 mg total) by mouth 3 (three) times daily., Disp: 270 tablet, Rfl: 3   irbesartan  (AVAPRO ) 300 MG tablet, Take 1 tablet (300 mg total) by mouth daily., Disp: 90 tablet, Rfl: 3   latanoprost (XALATAN) 0.005 % ophthalmic solution, Place 1 drop into both eyes at bedtime., Disp: , Rfl: 4   levothyroxine  (SYNTHROID ) 100 MCG tablet, TAKE 1 TABLET BY MOUTH EVERY DAY, Disp: 90 tablet, Rfl: 3   Multiple Vitamin (MULTIVITAMIN) capsule, Take 1 capsule by mouth daily., Disp: , Rfl:    potassium chloride  (KLOR-CON  M10) 10 MEQ tablet, Take 1 tablet (10 mEq total) by mouth daily., Disp: 90 tablet, Rfl: 3   Probiotic  Product (PHILLIPS COLON HEALTH PO), Take 1 capsule by mouth daily. , Disp: , Rfl:     traMADol  (ULTRAM -ER) 100 MG 24 hr tablet, Take 1 tablet (100 mg total) by mouth daily., Disp: 30 tablet, Rfl: 3   valACYclovir (VALTREX) 1000 MG tablet, Take 1,000 mg by mouth., Disp: , Rfl:

## 2024-04-14 ENCOUNTER — Ambulatory Visit: Admitting: Internal Medicine

## 2024-04-20 ENCOUNTER — Other Ambulatory Visit

## 2024-04-20 DIAGNOSIS — E559 Vitamin D deficiency, unspecified: Secondary | ICD-10-CM | POA: Diagnosis not present

## 2024-04-20 DIAGNOSIS — G4710 Hypersomnia, unspecified: Secondary | ICD-10-CM | POA: Diagnosis not present

## 2024-04-20 DIAGNOSIS — E119 Type 2 diabetes mellitus without complications: Secondary | ICD-10-CM | POA: Diagnosis not present

## 2024-04-20 DIAGNOSIS — I10 Essential (primary) hypertension: Secondary | ICD-10-CM

## 2024-04-20 DIAGNOSIS — E538 Deficiency of other specified B group vitamins: Secondary | ICD-10-CM | POA: Diagnosis not present

## 2024-04-20 DIAGNOSIS — G4730 Sleep apnea, unspecified: Secondary | ICD-10-CM | POA: Diagnosis not present

## 2024-04-20 LAB — HEPATIC FUNCTION PANEL
ALT: 22 U/L (ref 3–35)
AST: 21 U/L (ref 5–37)
Albumin: 3.6 g/dL (ref 3.5–5.2)
Alkaline Phosphatase: 107 U/L (ref 39–117)
Bilirubin, Direct: 0.1 mg/dL (ref 0.1–0.3)
Total Bilirubin: 0.5 mg/dL (ref 0.2–1.2)
Total Protein: 6.8 g/dL (ref 6.0–8.3)

## 2024-04-20 LAB — BASIC METABOLIC PANEL WITH GFR
BUN: 15 mg/dL (ref 6–23)
CO2: 28 meq/L (ref 19–32)
Calcium: 9.2 mg/dL (ref 8.4–10.5)
Chloride: 105 meq/L (ref 96–112)
Creatinine, Ser: 0.94 mg/dL (ref 0.40–1.20)
GFR: 61.48 mL/min
Glucose, Bld: 147 mg/dL — ABNORMAL HIGH (ref 70–99)
Potassium: 3.3 meq/L — ABNORMAL LOW (ref 3.5–5.1)
Sodium: 142 meq/L (ref 135–145)

## 2024-04-20 LAB — CBC WITH DIFFERENTIAL/PLATELET
Basophils Absolute: 0 K/uL (ref 0.0–0.1)
Basophils Relative: 1 % (ref 0.0–3.0)
Eosinophils Absolute: 0.1 K/uL (ref 0.0–0.7)
Eosinophils Relative: 3.7 % (ref 0.0–5.0)
HCT: 35.4 % — ABNORMAL LOW (ref 36.0–46.0)
Hemoglobin: 12.4 g/dL (ref 12.0–15.0)
Lymphocytes Relative: 27.8 % (ref 12.0–46.0)
Lymphs Abs: 0.9 K/uL (ref 0.7–4.0)
MCHC: 35 g/dL (ref 30.0–36.0)
MCV: 102.4 fl — ABNORMAL HIGH (ref 78.0–100.0)
Monocytes Absolute: 0.2 K/uL (ref 0.1–1.0)
Monocytes Relative: 6.8 % (ref 3.0–12.0)
Neutro Abs: 2.1 K/uL (ref 1.4–7.7)
Neutrophils Relative %: 60.7 % (ref 43.0–77.0)
Platelets: 242 K/uL (ref 150.0–400.0)
RBC: 3.46 Mil/uL — ABNORMAL LOW (ref 3.87–5.11)
RDW: 17.8 % — ABNORMAL HIGH (ref 11.5–15.5)
WBC: 3.4 K/uL — ABNORMAL LOW (ref 4.0–10.5)

## 2024-04-20 LAB — URINALYSIS, ROUTINE W REFLEX MICROSCOPIC
Bilirubin Urine: NEGATIVE
Hgb urine dipstick: NEGATIVE
Ketones, ur: NEGATIVE
Leukocytes,Ua: NEGATIVE
Nitrite: NEGATIVE
Specific Gravity, Urine: 1.02 (ref 1.000–1.030)
Urine Glucose: NEGATIVE
Urobilinogen, UA: 1 (ref 0.0–1.0)
pH: 7 (ref 5.0–8.0)

## 2024-04-20 LAB — MICROALBUMIN / CREATININE URINE RATIO
Creatinine,U: 186 mg/dL
Microalb Creat Ratio: 36.9 mg/g — ABNORMAL HIGH (ref 0.0–30.0)
Microalb, Ur: 6.9 mg/dL — ABNORMAL HIGH (ref 0.7–1.9)

## 2024-04-20 LAB — LIPID PANEL
Cholesterol: 156 mg/dL (ref 28–200)
HDL: 81.2 mg/dL
LDL Cholesterol: 63 mg/dL (ref 10–99)
NonHDL: 75.1
Total CHOL/HDL Ratio: 2
Triglycerides: 61 mg/dL (ref 10.0–149.0)
VLDL: 12.2 mg/dL (ref 0.0–40.0)

## 2024-04-20 LAB — TSH: TSH: 1.91 u[IU]/mL (ref 0.35–5.50)

## 2024-04-20 LAB — HEMOGLOBIN A1C: Hgb A1c MFr Bld: 6.6 % — ABNORMAL HIGH (ref 4.6–6.5)

## 2024-04-20 LAB — VITAMIN B12: Vitamin B-12: >1500 pg/mL — ABNORMAL HIGH (ref 211–911)

## 2024-04-20 LAB — VITAMIN D 25 HYDROXY (VIT D DEFICIENCY, FRACTURES): VITD: 29.39 ng/mL — ABNORMAL LOW (ref 30.00–100.00)

## 2024-04-21 ENCOUNTER — Ambulatory Visit (INDEPENDENT_AMBULATORY_CARE_PROVIDER_SITE_OTHER): Admitting: Internal Medicine

## 2024-04-21 ENCOUNTER — Encounter: Payer: Self-pay | Admitting: Internal Medicine

## 2024-04-21 VITALS — BP 142/80 | HR 80 | Temp 98.0°F | Ht 68.0 in | Wt 181.0 lb

## 2024-04-21 DIAGNOSIS — E559 Vitamin D deficiency, unspecified: Secondary | ICD-10-CM

## 2024-04-21 DIAGNOSIS — E7800 Pure hypercholesterolemia, unspecified: Secondary | ICD-10-CM | POA: Diagnosis not present

## 2024-04-21 DIAGNOSIS — I10 Essential (primary) hypertension: Secondary | ICD-10-CM

## 2024-04-21 DIAGNOSIS — E876 Hypokalemia: Secondary | ICD-10-CM

## 2024-04-21 DIAGNOSIS — I890 Lymphedema, not elsewhere classified: Secondary | ICD-10-CM

## 2024-04-21 MED ORDER — POTASSIUM CHLORIDE CRYS ER 10 MEQ PO TBCR: 10.0000 meq | EXTENDED_RELEASE_TABLET | Freq: Every day | ORAL | 3 refills | Status: AC

## 2024-04-21 MED ORDER — AMOXICILLIN 500 MG PO CAPS: 500.0000 mg | ORAL_CAPSULE | Freq: Every day | ORAL | 2 refills | Status: AC | PRN

## 2024-04-21 MED ORDER — FUROSEMIDE 40 MG PO TABS: 40.0000 mg | ORAL_TABLET | Freq: Every day | ORAL | 3 refills | Status: AC | PRN

## 2024-04-21 MED ORDER — ATORVASTATIN CALCIUM 10 MG PO TABS: 10.0000 mg | ORAL_TABLET | Freq: Every day | ORAL | 3 refills | Status: AC

## 2024-04-21 MED ORDER — DILTIAZEM HCL ER COATED BEADS 360 MG PO CP24: 360.0000 mg | ORAL_CAPSULE | Freq: Every day | ORAL | 3 refills | Status: AC

## 2024-04-21 MED ORDER — HYDRALAZINE HCL 100 MG PO TABS: 100.0000 mg | ORAL_TABLET | Freq: Three times a day (TID) | ORAL | 3 refills | Status: AC

## 2024-04-21 MED ORDER — LEVOTHYROXINE SODIUM 100 MCG PO TABS: 100.0000 ug | ORAL_TABLET | Freq: Every day | ORAL | 3 refills | Status: AC

## 2024-04-21 MED ORDER — GUANFACINE HCL 2 MG PO TABS: ORAL_TABLET | ORAL | 3 refills | Status: AC

## 2024-04-21 MED ORDER — IRBESARTAN 300 MG PO TABS: 300.0000 mg | ORAL_TABLET | Freq: Every day | ORAL | 3 refills | Status: AC

## 2024-04-21 NOTE — Assessment & Plan Note (Signed)
 Lab Results  Component Value Date   K 3.3 (L) 04/20/2024   Mild low uncontrolled, liekly due to lasix  use, for restart klor con 10 qd

## 2024-04-21 NOTE — Assessment & Plan Note (Signed)
 Lab Results  Component Value Date   LDLCALC 63 04/20/2024   Stable, pt to continue current statin lipitor 10 mg qd

## 2024-04-21 NOTE — Patient Instructions (Signed)
Please continue all other medications as before, and refills have been done if requested.  Please have the pharmacy call with any other refills you may need.  Please continue your efforts at being more active, low cholesterol diet, and weight control.  Please keep your appointments with your specialists as you may have planned  Please make an Appointment to return in 6 months, or sooner if needed 

## 2024-04-21 NOTE — Assessment & Plan Note (Addendum)
 BP Readings from Last 3 Encounters:  04/21/24 (!) 142/80  04/08/24 135/80  03/17/24 (!) 145/83   Uncontrolled here , but pt states controlled at home, pt to continue medical treatment card CD 360 mg every day, lasix  40 every day, hydralazine  100 tid and avapro  300 mg every day- declines other change for now

## 2024-04-21 NOTE — Assessment & Plan Note (Signed)
Chronic stable, cont current med tx °

## 2024-04-21 NOTE — Progress Notes (Signed)
 "      Chief Complaint: follow up HTN, HLD, DM, low K, low vit d       HPI:  Penny Yates is a 68 y.o. female here overall doing ok, Pt denies chest pain, increased sob or doe, wheezing, orthopnea, PND, increased LE swelling, palpitations, dizziness or syncope, but still has chronic bilateral leg lymphedema Wt is increased 6 lbs she feels due to diet likely, has been drinking sugar sodas much more lately.   Pt denies polydipsia, polyuria, or new focal neuro s/s.    Pt denies fever, night sweats, loss of appetite, or other constitutional symptoms  BP has been controlled at home       Wt Readings from Last 3 Encounters:  04/21/24 181 lb (82.1 kg)  03/17/24 175 lb (79.4 kg)  02/12/24 170 lb (77.1 kg)   BP Readings from Last 3 Encounters:  04/21/24 (!) 142/80  04/08/24 135/80  03/17/24 (!) 145/83         Past Medical History:  Diagnosis Date   Anemia    Chronic venous insufficiency    LE's   Depression    pt denies   Diabetes mellitus without complication (HCC)    no meds  pt. denies states she is pre diabetic    Fracture of shaft of right radius 01/06/2013   History of colonic polyps    multiple of succeeding years 96, 97, 98, and 99   Hyperlipidemia    Hypertension    Hyperthyroidism    s/p rad Iodine -[facility]   Hypothyroidism 01/19/2015   Leg length discrepancy    right leg longer   Low back pain    Lymphedema    left arm, both legs; primary lymphedema   Lymphedema    legs   Neuromuscular disorder (HCC)    sciatiaca   Osteoporosis    Sacroiliitis    history of   Past Surgical History:  Procedure Laterality Date   [place]  [place]in [place]   EYE SURGERY     both eyes-muscle repair  cataract right eye   OPEN REDUCTION INTERNAL FIXATION (ORIF) DISTAL RADIAL FRACTURE Right 01/06/2013   Procedure: RIGHT OPEN REDUCTION INTERNAL FIXATION (ORIF) DISTAL ARTICULATING RADIAL FRACTURE ;  Surgeon: Fonda SHAUNNA Olmsted, MD;  Location: [facility];  Service: Orthopedics;   Laterality: Right;   REPLACEMENT TOTAL KNEE  june 2007   right   REVISION OF SCAR TISSUE RECTUS MUSCLE  11/08   right knee   TOTAL KNEE REVISION Right 04/13/2020   Procedure: Right knee polyethylene revision.;  Surgeon: Melodi Lerner, MD;  Location: [facility];  Service: Orthopedics;  Laterality: Right;    TUBAL LIGATION      reports that she has never smoked. She has never used smokeless tobacco. She reports that she does not drink alcohol and does not use drugs. family history includes Cancer in her mother; Colon cancer (age of onset: 74) in her mother; Diabetes in her mother and another family member; Heart disease in her maternal grandmother; Kidney disease in an other family member; Stroke in an other family member. Allergies[1] Medications Ordered Prior to Encounter[2]      ROS:  All others reviewed and negative.  Objective        PE:  BP (!) 142/80 (BP Location: Right Arm, Patient Position: Sitting, Cuff Size: Normal)   Pulse 80   Temp 98 F (36.7 C) (Oral)   Ht 5' 8 (1.727 m)   Wt 181 lb (82.1  kg)   SpO2 98%   BMI 27.52 kg/m                 Constitutional: Pt appears in NAD               HENT: Head: NCAT.                Right Ear: External ear normal.                 Left Ear: External ear normal.                Eyes: . Pupils are equal, round, and reactive to light. Conjunctivae and EOM are normal               Nose: without d/c or deformity               Neck: Neck supple. Gross normal ROM               Cardiovascular: Normal rate and regular rhythm.                 Pulmonary/Chest: Effort normal and breath sounds without rales or wheezing.                Abd:  Soft, NT, ND, + BS, no organomegaly               Neurological: Pt is alert. At baseline orientation, motor grossly intact               Skin: Skin is warm. No rashes, no other new lesions, LE edema - bilateral lymphedema               Psychiatric: Pt behavior is normal without agitation   Micro:  none  Cardiac tracings I have personally interpreted today:  none  Pertinent Radiological findings (summarize): none   Lab Results  Component Value Date   WBC 3.4 (L) 04/20/2024   HGB 12.4 04/20/2024   HCT 35.4 (L) 04/20/2024   PLT 242.0 04/20/2024   GLUCOSE 147 (H) 04/20/2024   CHOL 156 04/20/2024   TRIG 61.0 04/20/2024   HDL 81.20 04/20/2024   LDLDIRECT 131.2 10/27/2008   LDLCALC 63 04/20/2024   ALT 22 04/20/2024   AST 21 04/20/2024   NA 142 04/20/2024   K 3.3 (L) 04/20/2024   CL 105 04/20/2024   CREATININE 0.94 04/20/2024   BUN 15 04/20/2024   CO2 28 04/20/2024   TSH 1.91 04/20/2024   INR 1.0 04/06/2020   HGBA1C 6.6 (H) 04/20/2024   MICROALBUR 6.9 (H) 04/20/2024   Assessment/Plan:  Penny Yates is a 68 y.o. Black or African American [2] female with  has a past medical history of Anemia, Chronic venous insufficiency, Depression, Diabetes mellitus without complication (HCC), Fracture of shaft of right radius (01/06/2013), History of colonic polyps, Hyperlipidemia, Hypertension, Hyperthyroidism, Hypothyroidism (01/19/2015), Leg length discrepancy, Low back pain, Lymphedema, Lymphedema, Neuromuscular disorder (HCC), Osteoporosis, and Sacroiliitis.  Vitamin D  deficiency Last vitamin D  Lab Results  Component Value Date   VD25OH 29.39 (L) 04/20/2024   Low, to start oral replacement   Lymphedema Chronic stable, cont current med tx  Hypertension BP Readings from Last 3 Encounters:  04/21/24 (!) 142/80  04/08/24 135/80  03/17/24 (!) 145/83   Uncontrolled here , but pt states controlled at home, pt to continue medical treatment card CD 360 mg every day, lasix  40 every day, hydralazine  100 tid and avapro  300 mg every  day- declines other change for now   Hyperlipidemia Lab Results  Component Value Date   LDLCALC 63 04/20/2024   Stable, pt to continue current statin lipitor 10 mg qd   Hypopotassemia Lab Results  Component Value Date   K 3.3 (L) 04/20/2024    Mild low uncontrolled, liekly due to lasix  use, for restart klor con 10 qd  Followup: Return in about 6 months (around 10/20/2024).  Lynwood Rush, MD 04/21/2024 12:48 PM [facility]     [1]  Allergies Allergen Reactions   Ace Inhibitors Cough   Levaquin  [Levofloxacin ] Rash   Lisinopril  Other (See Comments)    Cough  [2]  Current Outpatient Medications on File Prior to Visit  Medication Sig Dispense Refill   ALPHAGAN  P 0.1 % SOLN Place 1 drop into both eyes in the morning and at bedtime.   11   amitriptyline  (ELAVIL ) 100 MG tablet Take 0.5-1 tablets (50-100 mg total) by mouth at bedtime as needed. 90 tablet 1   ascorbic acid (VITAMIN C) 500 MG tablet Take 500 mg by mouth daily.     azaTHIOprine (IMURAN) 50 MG tablet Take by mouth.     azelastine  (ASTELIN ) 0.1 % nasal spray Place 2 sprays into both nostrils 2 (two) times daily. Use in each nostril as directed 30 mL 12   Biotin 5000 MCG CAPS Take 5,000 mcg by mouth daily.     cetirizine  (ZYRTEC  ALLERGY) 10 MG tablet Take 0.5 tablets (5 mg total) by mouth daily. 30 tablet 2   cholecalciferol (VITAMIN D3) 25 MCG (1000 UNIT) tablet Take 1,000 Units by mouth daily.     diclofenac  Sodium (VOLTAREN ) 1 % GEL Apply 2 g topically 4 (four) times daily as needed (pain).      dorzolamide -timolol  (COSOPT ) 22.3-6.8 MG/ML ophthalmic solution Place 1 drop into both eyes 2 (two) times daily.      fish oil-omega-3 fatty acids 1000 MG capsule Take 1 g by mouth daily.     latanoprost (XALATAN) 0.005 % ophthalmic solution Place 1 drop into both eyes at bedtime.  4   Multiple Vitamin (MULTIVITAMIN) capsule Take 1 capsule by mouth daily.     Probiotic Product (PHILLIPS COLON HEALTH PO) Take 1 capsule by mouth daily.      traMADol  (ULTRAM -ER) 100 MG 24 hr tablet Take 1 tablet (100 mg total) by mouth daily. 30 tablet 3   valACYclovir (VALTREX) 1000 MG tablet Take 1,000 mg by mouth.     No  current facility-administered medications on file prior to visit.   "

## 2024-04-21 NOTE — Assessment & Plan Note (Signed)
 Last vitamin D  Lab Results  Component Value Date   VD25OH 29.39 (L) 04/20/2024   Low, to start oral replacement

## 2024-05-05 ENCOUNTER — Encounter (HOSPITAL_BASED_OUTPATIENT_CLINIC_OR_DEPARTMENT_OTHER): Payer: Self-pay

## 2024-05-05 ENCOUNTER — Ambulatory Visit (INDEPENDENT_AMBULATORY_CARE_PROVIDER_SITE_OTHER)

## 2024-05-05 VITALS — BP 143/80 | HR 86 | Ht 68.0 in | Wt 179.0 lb

## 2024-05-05 DIAGNOSIS — G4733 Obstructive sleep apnea (adult) (pediatric): Secondary | ICD-10-CM

## 2024-05-05 NOTE — Progress Notes (Signed)
 "  @Patient  ID: Penny Yates, female    DOB: [birth year], 69 y.o.   MRN: [id number]  Chief Complaint  Patient presents with   Follow-up    Sleep     Referring provider: Norleen Lynwood ORN, MD  HPI: Discussed the use of AI scribe software for clinical note transcription with the patient, who gave verbal consent to proceed.  History of Present Illness Penny Yates is a 69 year old female with h/o snoring and sleep disturbances who presents for follow-up after a sleep study.  She underwent a sleep study which confirmed severe obstructive sleep apnea, with an apnea-hypopnea index (AHI) of 55.3 events per hour, escalating to over 100 events per hour during deep sleep. She feels extremely tired and attributes this fatigue to her sleep apnea.  She has a history of hypertension, first diagnosed in her twenties during her second pregnancy. Despite being on antihypertensive medication, her blood pressure was recently recorded at 198/85 mmHg. She notes a sedentary lifestyle since retiring in July, contributing to a 10-pound weight gain.  Her son, aged 74 or 56, also has sleep apnea. She mentions her husband also has sleep apnea and uses a CPAP machine.  She experiences nocturia, waking up at night to urinate, which she attributes to her diuretic medication. She also reports daytime fatigue, difficulty completing physical activities such as walking, and feeling tired despite adequate sleep duration.  Last OV 03/17/2024: Penny Yates is a 69 year old female who presents with sleep disturbances. She was referred by Dr. Tobie for evaluation of her sleep disturbances.   She experiences significant snoring, as noted by her husband, although he is unable to observe any apneic episodes due to his own CPAP use. She goes to bed between 10 and 11 PM, falls asleep within 15 minutes, and wakes up around 9 AM. She wakes up two to three times per night to urinate, which she attributes to taking her diuretic  at night. No waking up short of breath or feeling suffocated. Her weight has increased by 8 to 10 pounds over the last two years. A sleep study conducted in 2010 showed an AHI of 5.2, and she was told she did not have sleep apnea.   She has a history of hypertension, high cholesterol, and hypothyroidism. She takes levothyroxine  100 mcg daily, which was increased from 75 mcg due to persistent symptoms. She initially had hyperthyroidism treated with radioactive iodine  due to nodules, which led to hypothyroidism. She never feels hungry and often experiences food regurgitation unless she eats light meals.   She experiences palpitations with exertion, which resolve with rest. She takes diltiazem  for blood pressure management. There is a family history of atrial fibrillation, but she has not been diagnosed with it.   She experiences lymphedema and uses a diuretic as needed. She finds it difficult to access lymphedema therapy locally and has been managing with home equipment, alternating compression on her legs. Compression must be full-length from toes to waist due to swelling.   She retired from [employer] and is less active than before, contributing to her weight gain. She walks her dog around her property twice a day, covering 1.7 acres each time.     TEST/EVENTS : HST 04/05/2024:  AHI 3% 55.3/hr and RDI elevated to 104.6/hr during recording of the maximum density.  AHI 4% 32.1/hr   12/15/2008:  AHI 5.2/hr  Allergies[1]  Immunization History  Administered Date(s) Administered    sv, Bivalent, Protein Subunit Rsvpref,pf [place])  02/02/2023   Fluad Quad(high Dose 65+) 12/20/2018   Fluad Trivalent(High Dose 65+) 01/22/2023, 01/28/2024   INFLUENZA, HIGH DOSE SEASONAL PF 02/11/2020, 01/20/2022   Influenza Inj Mdck Quad Pf 01/28/2018   Influenza Split 01/08/2011, 02/12/2012   Influenza Whole 02/18/2009   Influenza,inj,Quad PF,6+ Mos 02/19/2013, 07/06/2015, 03/09/2016, 03/09/2016, 12/18/2016    Influenza-Unspecified 12/18/2018, 01/28/2021, 01/22/2022   PFIZER Comirnaty(Gray Top)Covid-19 Tri-Sucrose Vaccine 07/30/2020   PFIZER(Purple Top)SARS-COV-2 Vaccination 05/28/2019, 06/22/2019, 01/21/2020   Pfizer Covid-19 Vaccine Bivalent Booster 33yrs & up 02/07/2021   Pfizer(Comirnaty)Fall Seasonal Vaccine 12 years and older 01/28/2024   Pneumococcal Conjugate-13 07/02/2016   Pneumococcal Polysaccharide-23 10/15/2019   Td 12/22/2004   Tdap 06/26/2016   Unspecified SARS-COV-2 Vaccination 01/20/2022   Zoster Recombinant(Shingrix) 06/29/2017, 11/10/2017    Past Medical History:  Diagnosis Date   Anemia    Chronic venous insufficiency    LE's   Depression    pt denies   Diabetes mellitus without complication (HCC)    no meds  pt. denies states she is pre diabetic    Fracture of shaft of right radius 01/06/2013   History of colonic polyps    multiple of succeeding years 96, 97, 98, and 99   Hyperlipidemia    Hypertension    Hyperthyroidism    s/p rad Iodine -[place]   Hypothyroidism 01/19/2015   Leg length discrepancy    right leg longer   Low back pain    Lymphedema    left arm, both legs; primary lymphedema   Lymphedema    legs   Neuromuscular disorder (HCC)    sciatiaca   Osteoporosis    Sacroiliitis    history of    Tobacco History: Tobacco Use History[2] Counseling given: Not Answered   Outpatient Medications Prior to Visit  Medication Sig Dispense Refill   ALPHAGAN  P 0.1 % SOLN Place 1 drop into both eyes in the morning and at bedtime.   11   amitriptyline  (ELAVIL ) 100 MG tablet Take 0.5-1 tablets (50-100 mg total) by mouth at bedtime as needed. 90 tablet 1   amoxicillin  (AMOXIL ) 500 MG capsule Take 1 capsule (500 mg total) by mouth daily as needed. TAKE 2 CAPSULES BY MOUTH 1 HOUR PRIOR TO DENTAL PROCEDURE, THEN TAKE 2 CAPSULES 4 HOURS AFTER 4 capsule 2   ascorbic acid (VITAMIN C) 500 MG tablet Take 500 mg by mouth daily.     atorvastatin  (LIPITOR) 10 MG tablet  Take 1 tablet (10 mg total) by mouth daily. 90 tablet 3   azaTHIOprine (IMURAN) 50 MG tablet Take by mouth.     azelastine  (ASTELIN ) 0.1 % nasal spray Place 2 sprays into both nostrils 2 (two) times daily. Use in each nostril as directed 30 mL 12   Biotin 5000 MCG CAPS Take 5,000 mcg by mouth daily.     cetirizine  (ZYRTEC  ALLERGY) 10 MG tablet Take 0.5 tablets (5 mg total) by mouth daily. 30 tablet 2   cholecalciferol (VITAMIN D3) 25 MCG (1000 UNIT) tablet Take 1,000 Units by mouth daily.     diclofenac  Sodium (VOLTAREN ) 1 % GEL Apply 2 g topically 4 (four) times daily as needed (pain).      diltiazem  (CARDIZEM  CD) 360 MG 24 hr capsule Take 1 capsule (360 mg total) by mouth daily. 90 capsule 3   dorzolamide -timolol  (COSOPT ) 22.3-6.8 MG/ML ophthalmic solution Place 1 drop into both eyes 2 (two) times daily.      fish oil-omega-3 fatty acids 1000 MG capsule Take 1 g by mouth daily.  furosemide  (LASIX ) 40 MG tablet Take 1 tablet (40 mg total) by mouth daily as needed. As needed 90 tablet 3   guanFACINE  (TENEX ) 2 MG tablet TAKE 1 TABLET BY MOUTH EVERYDAY AT BEDTIME 90 tablet 3   hydrALAZINE  (APRESOLINE ) 100 MG tablet Take 1 tablet (100 mg total) by mouth 3 (three) times daily. 270 tablet 3   irbesartan  (AVAPRO ) 300 MG tablet Take 1 tablet (300 mg total) by mouth daily. 90 tablet 3   latanoprost (XALATAN) 0.005 % ophthalmic solution Place 1 drop into both eyes at bedtime.  4   levothyroxine  (SYNTHROID ) 100 MCG tablet Take 1 tablet (100 mcg total) by mouth daily. 90 tablet 3   Multiple Vitamin (MULTIVITAMIN) capsule Take 1 capsule by mouth daily.     potassium chloride  (KLOR-CON  M10) 10 MEQ tablet Take 1 tablet (10 mEq total) by mouth daily. 90 tablet 3   Probiotic Product (PHILLIPS COLON HEALTH PO) Take 1 capsule by mouth daily.      traMADol  (ULTRAM -ER) 100 MG 24 hr tablet Take 1 tablet (100 mg total) by mouth daily. 30 tablet 3   valACYclovir (VALTREX) 1000 MG tablet Take 1,000 mg by mouth.      No facility-administered medications prior to visit.     Review of Systems: as per hpi  Constitutional:   No  weight loss, night sweats,  Fevers, chills, fatigue, or  lassitude.  HEENT:   No headaches,  Difficulty swallowing,  Tooth/dental problems, or  Sore throat,                No sneezing, itching, ear ache, nasal congestion, post nasal drip,   CV:  No chest pain,  Orthopnea, PND, swelling in lower extremities, anasarca, dizziness, palpitations, syncope.   GI  No heartburn, indigestion, abdominal pain, nausea, vomiting, diarrhea, change in bowel habits, loss of appetite, bloody stools.   Resp: No shortness of breath with exertion or at rest.  No excess mucus, no productive cough,  No non-productive cough,  No coughing up of blood.  No change in color of mucus.  No wheezing.  No chest wall deformity  Skin: no rash or lesions.  GU: no dysuria, change in color of urine, no urgency or frequency.  No flank pain, no hematuria   MS:  No joint pain or swelling.  No decreased range of motion.  No back pain.    Physical Exam  BP (!) 143/80   Pulse 86   Ht 5' 8 (1.727 m)   Wt 179 lb (81.2 kg)   SpO2 98%   BMI 27.22 kg/m   GEN: A/Ox3; pleasant , NAD, well nourished    HEENT:  [place]/AT,  EACs-clear, TMs-wnl, NOSE-clear, THROAT-clear, no lesions, no postnasal drip or exudate noted.   NECK:  Supple w/ fair ROM; no JVD; normal carotid impulses w/o bruits; no thyromegaly or nodules palpated; no lymphadenopathy.    RESP  Clear  P & A; w/o, wheezes/ rales/ or rhonchi. no accessory muscle use, no dullness to percussion  CARD:  RRR, no m/r/g, no peripheral edema, pulses intact, no cyanosis or clubbing.  GI:   Soft & nt; nml bowel sounds; no organomegaly or masses detected.   Musco: Warm bil, no deformities or joint swelling noted.   Neuro: alert, no focal deficits noted.    Skin: Warm, no lesions or rashes    Lab Results:  CBC    Component Value Date/Time   WBC 3.4 (L)  04/20/2024 1300   RBC 3.46 (  L) 04/20/2024 1300   HGB 12.4 04/20/2024 1300   HCT 35.4 (L) 04/20/2024 1300   PLT 242.0 04/20/2024 1300   MCV 102.4 (H) 04/20/2024 1300   MCH 29.4 04/14/2020 0314   MCHC 35.0 04/20/2024 1300   RDW 17.8 (H) 04/20/2024 1300   LYMPHSABS 0.9 04/20/2024 1300   MONOABS 0.2 04/20/2024 1300   EOSABS 0.1 04/20/2024 1300   BASOSABS 0.0 04/20/2024 1300    BMET    Component Value Date/Time   NA 142 04/20/2024 1300   K 3.3 (L) 04/20/2024 1300   CL 105 04/20/2024 1300   CO2 28 04/20/2024 1300   GLUCOSE 147 (H) 04/20/2024 1300   BUN 15 04/20/2024 1300   CREATININE 0.94 04/20/2024 1300   CALCIUM  9.2 04/20/2024 1300   GFRNONAA >60 04/14/2020 0314   GFRAA >60 10/09/2017 1201    BNP No results found for: BNP  ProBNP    Component Value Date/Time   PROBNP 45.3 07/26/2010 1151    Imaging: No results found.  Administration History     None           No data to display          No results found for: NITRICOXIDE   Assessment & Plan:   Assessment & Plan OSA (obstructive sleep apnea)  Assessment and Plan Assessment & Plan Severe obstructive sleep apnea Confirmed by sleep study with AHI of 55.3. Symptoms include excessive daytime fatigue, nocturia. Discussed CPAP therapy benefits, mask options, and Inspire device if CPAP fails. Emphasized treatment importance to prevent complications and improve quality of life. - Ordered CPAP machine, arranged mask fitting and education. - Provided information on machine cleaning and maintenance. - Attached information to after visit summary about living with sleep apnea, cpap care, and Inspire device.    Return in about 8 weeks (around 06/30/2024) for compliance download.  Candis Dandy, PA-C 05/05/2024      [1]  Allergies Allergen Reactions   Ace Inhibitors Cough   Levaquin  [Levofloxacin ] Rash   Lisinopril  Other (See Comments)    Cough  [2]  Social History Tobacco Use  Smoking Status  Never  Smokeless Tobacco Never   "

## 2024-05-05 NOTE — Patient Instructions (Signed)
 Start wearing CPAP machine nightly with a goal of at least 4-6 hours a night or more.  Follow up in 31-90 days for compliance download.

## 2024-05-22 ENCOUNTER — Telehealth (HOSPITAL_BASED_OUTPATIENT_CLINIC_OR_DEPARTMENT_OTHER): Payer: Self-pay | Admitting: *Deleted

## 2024-05-22 NOTE — Telephone Encounter (Signed)
 CMN received for CPAP supplies signed by provider and faxed confirmation received

## 2024-07-01 ENCOUNTER — Ambulatory Visit (HOSPITAL_BASED_OUTPATIENT_CLINIC_OR_DEPARTMENT_OTHER)
